# Patient Record
Sex: Female | Born: 1975 | Hispanic: Yes | Marital: Married | Smoking: Never smoker
Health system: Southern US, Community
[De-identification: ages and names within clinical notes are randomized; demographics above are authoritative.]

## PROBLEM LIST (undated history)

## (undated) DIAGNOSIS — B9689 Other specified bacterial agents as the cause of diseases classified elsewhere: Secondary | ICD-10-CM

## (undated) DIAGNOSIS — Z8049 Family history of malignant neoplasm of other genital organs: Secondary | ICD-10-CM

## (undated) DIAGNOSIS — C801 Malignant (primary) neoplasm, unspecified: Secondary | ICD-10-CM

## (undated) DIAGNOSIS — Z8041 Family history of malignant neoplasm of ovary: Secondary | ICD-10-CM

## (undated) DIAGNOSIS — F329 Major depressive disorder, single episode, unspecified: Secondary | ICD-10-CM

## (undated) DIAGNOSIS — F32A Depression, unspecified: Secondary | ICD-10-CM

## (undated) DIAGNOSIS — E7800 Pure hypercholesterolemia, unspecified: Secondary | ICD-10-CM

## (undated) DIAGNOSIS — Z800 Family history of malignant neoplasm of digestive organs: Secondary | ICD-10-CM

## (undated) HISTORY — DX: Family history of malignant neoplasm of ovary: Z80.41

## (undated) HISTORY — DX: Other specified bacterial agents as the cause of diseases classified elsewhere: B96.89

## (undated) HISTORY — DX: Family history of malignant neoplasm of digestive organs: Z80.0

## (undated) HISTORY — PX: DIAGNOSTIC LAPAROSCOPY: SUR761

## (undated) HISTORY — DX: Family history of malignant neoplasm of other genital organs: Z80.49

## (undated) HISTORY — PX: ABDOMINAL HYSTERECTOMY: SHX81

## (undated) HISTORY — PX: DILATION AND CURETTAGE OF UTERUS: SHX78

## (undated) HISTORY — DX: Malignant (primary) neoplasm, unspecified: C80.1

---
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
## 2010-02-17 ENCOUNTER — Emergency Department (HOSPITAL_COMMUNITY): Admission: EM | Admit: 2010-02-17 | Discharge: 2010-02-17 | Payer: Self-pay | Admitting: Family Medicine

## 2010-11-11 ENCOUNTER — Emergency Department (HOSPITAL_COMMUNITY)
Admission: EM | Admit: 2010-11-11 | Discharge: 2010-11-11 | Disposition: A | Discharge status: Home / Self Care | Payer: BC Managed Care – PPO | Attending: Emergency Medicine | Admitting: Emergency Medicine

## 2010-11-11 DIAGNOSIS — H04309 Unspecified dacryocystitis of unspecified lacrimal passage: Secondary | ICD-10-CM | POA: Insufficient documentation

## 2010-11-11 DIAGNOSIS — H5789 Other specified disorders of eye and adnexa: Secondary | ICD-10-CM | POA: Insufficient documentation

## 2010-11-11 DIAGNOSIS — R221 Localized swelling, mass and lump, neck: Secondary | ICD-10-CM | POA: Insufficient documentation

## 2010-11-11 DIAGNOSIS — R220 Localized swelling, mass and lump, head: Secondary | ICD-10-CM | POA: Insufficient documentation

## 2012-02-10 ENCOUNTER — Other Ambulatory Visit (HOSPITAL_COMMUNITY): Payer: Self-pay | Admitting: Obstetrics & Gynecology

## 2012-02-10 DIAGNOSIS — N979 Female infertility, unspecified: Secondary | ICD-10-CM

## 2012-02-16 ENCOUNTER — Ambulatory Visit (HOSPITAL_COMMUNITY)
Admission: RE | Admit: 2012-02-16 | Discharge: 2012-02-16 | Disposition: A | Discharge status: Home / Self Care | Payer: BC Managed Care – PPO | Source: Ambulatory Visit | Attending: Obstetrics & Gynecology | Admitting: Obstetrics & Gynecology

## 2012-02-16 DIAGNOSIS — N979 Female infertility, unspecified: Secondary | ICD-10-CM

## 2012-02-16 MED ORDER — IOHEXOL 300 MG/ML  SOLN
10.0000 mL | Freq: Once | INTRAMUSCULAR | Status: AC | PRN
  Administered 2012-02-16: 10 mL

## 2013-03-22 ENCOUNTER — Other Ambulatory Visit (HOSPITAL_COMMUNITY): Payer: Self-pay | Admitting: Obstetrics and Gynecology

## 2013-03-22 DIAGNOSIS — N971 Female infertility of tubal origin: Secondary | ICD-10-CM

## 2013-03-29 ENCOUNTER — Encounter (INDEPENDENT_AMBULATORY_CARE_PROVIDER_SITE_OTHER): Payer: Self-pay

## 2013-03-29 ENCOUNTER — Ambulatory Visit (HOSPITAL_COMMUNITY)
Admission: RE | Admit: 2013-03-29 | Discharge: 2013-03-29 | Disposition: A | Discharge status: Home / Self Care | Payer: BC Managed Care – PPO | Source: Ambulatory Visit | Attending: Obstetrics and Gynecology | Admitting: Obstetrics and Gynecology

## 2013-03-29 DIAGNOSIS — N971 Female infertility of tubal origin: Secondary | ICD-10-CM

## 2013-03-29 MED ORDER — IOHEXOL 300 MG/ML  SOLN
20.0000 mL | Freq: Once | INTRAMUSCULAR | Status: AC | PRN
  Administered 2013-03-29: 20 mL

## 2013-04-08 ENCOUNTER — Emergency Department (HOSPITAL_COMMUNITY): Payer: BC Managed Care – PPO

## 2013-04-08 ENCOUNTER — Encounter (HOSPITAL_COMMUNITY): Payer: Self-pay | Admitting: Emergency Medicine

## 2013-04-08 ENCOUNTER — Emergency Department (HOSPITAL_COMMUNITY)
Admission: EM | Admit: 2013-04-08 | Discharge: 2013-04-08 | Disposition: A | Discharge status: Home / Self Care | Payer: BC Managed Care – PPO | Attending: Emergency Medicine | Admitting: Emergency Medicine

## 2013-04-08 DIAGNOSIS — Y92410 Unspecified street and highway as the place of occurrence of the external cause: Secondary | ICD-10-CM | POA: Insufficient documentation

## 2013-04-08 DIAGNOSIS — M549 Dorsalgia, unspecified: Secondary | ICD-10-CM

## 2013-04-08 DIAGNOSIS — L089 Local infection of the skin and subcutaneous tissue, unspecified: Secondary | ICD-10-CM | POA: Insufficient documentation

## 2013-04-08 DIAGNOSIS — M545 Low back pain, unspecified: Secondary | ICD-10-CM | POA: Insufficient documentation

## 2013-04-08 DIAGNOSIS — T07XXXA Unspecified multiple injuries, initial encounter: Secondary | ICD-10-CM

## 2013-04-08 DIAGNOSIS — Z79899 Other long term (current) drug therapy: Secondary | ICD-10-CM | POA: Insufficient documentation

## 2013-04-08 DIAGNOSIS — Y9389 Activity, other specified: Secondary | ICD-10-CM | POA: Insufficient documentation

## 2013-04-08 MED ORDER — ONDANSETRON 4 MG PO TBDP
4.0000 mg | ORAL_TABLET | Freq: Once | ORAL | Status: AC
  Administered 2013-04-08: 4 mg via ORAL
  Filled 2013-04-08: qty 1

## 2013-04-08 MED ORDER — HYDROMORPHONE HCL PF 1 MG/ML IJ SOLN
1.0000 mg | Freq: Once | INTRAMUSCULAR | Status: AC
  Administered 2013-04-08: 1 mg via INTRAMUSCULAR
  Filled 2013-04-08: qty 1

## 2013-04-08 MED ORDER — ONDANSETRON 4 MG PO TBDP
4.0000 mg | ORAL_TABLET | Freq: Once | ORAL | Status: AC
  Administered 2013-04-08: 4 mg via ORAL

## 2013-04-08 MED ORDER — ONDANSETRON 4 MG PO TBDP
ORAL_TABLET | ORAL | Status: AC
  Filled 2013-04-08: qty 1

## 2013-04-08 MED ORDER — LORAZEPAM 1 MG PO TABS
1.0000 mg | ORAL_TABLET | Freq: Once | ORAL | Status: AC
  Administered 2013-04-08: 1 mg via ORAL
  Filled 2013-04-08: qty 2

## 2013-04-08 MED ORDER — OXYCODONE-ACETAMINOPHEN 5-325 MG PO TABS: 1.0000 | ORAL_TABLET | Freq: Four times a day (QID) | ORAL | Status: DC | PRN
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
## 2013-04-09 NOTE — ED Provider Notes (Signed)
Medical screening examination/treatment/procedure(s) were performed by non-physician practitioner and as supervising physician I was immediately available for consultation/collaboration.  EKG Interpretation   None        Hurman Horn, MD 04/09/13 1237

## 2013-05-24 ENCOUNTER — Encounter (HOSPITAL_COMMUNITY): Payer: Self-pay | Admitting: *Deleted

## 2013-05-26 ENCOUNTER — Encounter (HOSPITAL_COMMUNITY): Payer: Self-pay

## 2013-06-07 ENCOUNTER — Ambulatory Visit (HOSPITAL_COMMUNITY): Payer: BC Managed Care – PPO | Admitting: Anesthesiology

## 2013-06-07 ENCOUNTER — Ambulatory Visit (HOSPITAL_COMMUNITY)
Admission: RE | Admit: 2013-06-07 | Discharge: 2013-06-08 | Disposition: A | Discharge status: Home / Self Care | Payer: BC Managed Care – PPO | Source: Ambulatory Visit | Attending: Obstetrics and Gynecology | Admitting: Obstetrics and Gynecology

## 2013-06-07 ENCOUNTER — Encounter (HOSPITAL_COMMUNITY)
Admission: RE | Disposition: A | Discharge status: Home / Self Care | Payer: Self-pay | Source: Ambulatory Visit | Attending: Obstetrics and Gynecology

## 2013-06-07 ENCOUNTER — Encounter (HOSPITAL_COMMUNITY): Payer: BC Managed Care – PPO | Admitting: Anesthesiology

## 2013-06-07 ENCOUNTER — Encounter (HOSPITAL_COMMUNITY): Payer: Self-pay | Admitting: *Deleted

## 2013-06-07 DIAGNOSIS — N971 Female infertility of tubal origin: Principal | ICD-10-CM | POA: Insufficient documentation

## 2013-06-07 DIAGNOSIS — N838 Other noninflammatory disorders of ovary, fallopian tube and broad ligament: Secondary | ICD-10-CM | POA: Insufficient documentation

## 2013-06-07 DIAGNOSIS — N7011 Chronic salpingitis: Secondary | ICD-10-CM | POA: Diagnosis present

## 2013-06-07 DIAGNOSIS — N7093 Salpingitis and oophoritis, unspecified: Secondary | ICD-10-CM | POA: Insufficient documentation

## 2013-06-07 HISTORY — PX: LAPAROTOMY: SHX154

## 2013-06-07 HISTORY — PX: HYSTEROSCOPY WITH D & C: SHX1775

## 2013-06-07 HISTORY — DX: Depression, unspecified: F32.A

## 2013-06-07 HISTORY — PX: LAPAROSCOPY: SHX197

## 2013-06-07 HISTORY — DX: Major depressive disorder, single episode, unspecified: F32.9

## 2013-06-07 LAB — CBC
HCT: 40.7 % (ref 36.0–46.0)
HEMOGLOBIN: 13.6 g/dL (ref 12.0–15.0)
MCH: 29 pg (ref 26.0–34.0)
MCHC: 33.4 g/dL (ref 30.0–36.0)
MCV: 86.8 fL (ref 78.0–100.0)
PLATELETS: 274 10*3/uL (ref 150–400)
RBC: 4.69 MIL/uL (ref 3.87–5.11)
RDW: 12.1 % (ref 11.5–15.5)
WBC: 9.2 10*3/uL (ref 4.0–10.5)

## 2013-06-07 LAB — PREGNANCY, URINE: PREG TEST UR: NEGATIVE

## 2013-06-07 SURGERY — LAPAROSCOPY, DIAGNOSTIC
Anesthesia: General | Site: Uterus | Laterality: Right

## 2013-06-07 MED ORDER — KETOROLAC TROMETHAMINE 30 MG/ML IJ SOLN
INTRAMUSCULAR | Status: AC
  Filled 2013-06-07: qty 1

## 2013-06-07 MED ORDER — FENTANYL CITRATE 0.05 MG/ML IJ SOLN
INTRAMUSCULAR | Status: AC
  Filled 2013-06-07: qty 2

## 2013-06-07 MED ORDER — SODIUM CHLORIDE 0.9 % IV SOLN
INTRAVENOUS | Status: DC
Start: 2013-06-07 — End: 2013-06-08
  Administered 2013-06-07: 1000 mL via INTRAVENOUS
  Administered 2013-06-08: 09:00:00 via INTRAVENOUS

## 2013-06-07 MED ORDER — ONDANSETRON HCL 4 MG/2ML IJ SOLN
INTRAMUSCULAR | Status: DC | PRN
  Administered 2013-06-07: 4 mg via INTRAVENOUS

## 2013-06-07 MED ORDER — LIDOCAINE HCL (CARDIAC) 20 MG/ML IV SOLN
INTRAVENOUS | Status: DC | PRN
  Administered 2013-06-07: 80 mg via INTRAVENOUS

## 2013-06-07 MED ORDER — DEXAMETHASONE SODIUM PHOSPHATE 10 MG/ML IJ SOLN
INTRAMUSCULAR | Status: AC
  Filled 2013-06-07: qty 1

## 2013-06-07 MED ORDER — VASOPRESSIN 20 UNIT/ML IJ SOLN
INTRAMUSCULAR | Status: AC
  Filled 2013-06-07: qty 1

## 2013-06-07 MED ORDER — METHYLENE BLUE 1 % INJ SOLN
INTRAMUSCULAR | Status: AC
  Filled 2013-06-07: qty 10

## 2013-06-07 MED ORDER — GLYCOPYRROLATE 0.2 MG/ML IJ SOLN
INTRAMUSCULAR | Status: AC
  Filled 2013-06-07: qty 3

## 2013-06-07 MED ORDER — METHYLENE BLUE 1 % INJ SOLN
INTRAMUSCULAR | Status: DC | PRN
  Administered 2013-06-07: 60 mL

## 2013-06-07 MED ORDER — BUPIVACAINE HCL (PF) 0.25 % IJ SOLN
INTRAMUSCULAR | Status: AC
  Filled 2013-06-07: qty 30

## 2013-06-07 MED ORDER — HYDROMORPHONE HCL PF 1 MG/ML IJ SOLN
INTRAMUSCULAR | Status: AC
  Filled 2013-06-07: qty 1

## 2013-06-07 MED ORDER — CEFAZOLIN SODIUM-DEXTROSE 2-3 GM-% IV SOLR
2.0000 g | INTRAVENOUS | Status: AC
  Administered 2013-06-07: 2 g via INTRAVENOUS

## 2013-06-07 MED ORDER — LIDOCAINE HCL (CARDIAC) 20 MG/ML IV SOLN
INTRAVENOUS | Status: AC
  Filled 2013-06-07: qty 5

## 2013-06-07 MED ORDER — ONDANSETRON HCL 4 MG/2ML IJ SOLN
INTRAMUSCULAR | Status: AC
  Filled 2013-06-07: qty 2

## 2013-06-07 MED ORDER — BUPIVACAINE-EPINEPHRINE 0.25% -1:200000 IJ SOLN
INTRAMUSCULAR | Status: DC | PRN
  Administered 2013-06-07: 13 mL

## 2013-06-07 MED ORDER — HYDROMORPHONE HCL PF 1 MG/ML IJ SOLN
0.2000 mg | INTRAMUSCULAR | Status: DC | PRN
  Administered 2013-06-08: 0.6 mg via INTRAVENOUS
  Filled 2013-06-07: qty 1

## 2013-06-07 MED ORDER — FENTANYL CITRATE 0.05 MG/ML IJ SOLN
INTRAMUSCULAR | Status: AC
  Filled 2013-06-07: qty 5

## 2013-06-07 MED ORDER — LACTATED RINGERS IR SOLN
Status: DC | PRN
  Administered 2013-06-07: 1000 mL

## 2013-06-07 MED ORDER — METHYLENE BLUE 1 % INJ SOLN
INTRAMUSCULAR | Status: DC | PRN
  Administered 2013-06-07: 300 mL

## 2013-06-07 MED ORDER — ROCURONIUM BROMIDE 100 MG/10ML IV SOLN
INTRAVENOUS | Status: AC
  Filled 2013-06-07: qty 1

## 2013-06-07 MED ORDER — SODIUM CHLORIDE 0.9 % IR SOLN
Status: DC | PRN
  Administered 2013-06-07: 3000 mL

## 2013-06-07 MED ORDER — PROPOFOL 10 MG/ML IV EMUL
INTRAVENOUS | Status: AC
  Filled 2013-06-07: qty 20

## 2013-06-07 MED ORDER — SCOPOLAMINE 1 MG/3DAYS TD PT72
MEDICATED_PATCH | TRANSDERMAL | Status: AC
  Filled 2013-06-07: qty 1

## 2013-06-07 MED ORDER — NEOSTIGMINE METHYLSULFATE 1 MG/ML IJ SOLN
INTRAMUSCULAR | Status: DC | PRN
  Administered 2013-06-07: 3 mg via INTRAVENOUS

## 2013-06-07 MED ORDER — OXYCODONE-ACETAMINOPHEN 7.5-325 MG PO TABS: 1.0000 | ORAL_TABLET | ORAL | Status: DC | PRN

## 2013-06-07 MED ORDER — ONDANSETRON HCL 4 MG PO TABS: 4.0000 mg | ORAL_TABLET | Freq: Three times a day (TID) | ORAL | Status: DC | PRN

## 2013-06-07 MED ORDER — BUPIVACAINE HCL (PF) 0.25 % IJ SOLN
INTRAMUSCULAR | Status: DC | PRN
  Administered 2013-06-07: 13 mL

## 2013-06-07 MED ORDER — SODIUM CHLORIDE 0.9 % IV SOLN
INTRAVENOUS | Status: DC | PRN
  Administered 2013-06-07: 15:00:00 via INTRAMUSCULAR

## 2013-06-07 MED ORDER — LACTATED RINGERS IV SOLN
INTRAVENOUS | Status: DC
  Administered 2013-06-07 (×3): via INTRAVENOUS

## 2013-06-07 MED ORDER — ROCURONIUM BROMIDE 100 MG/10ML IV SOLN
INTRAVENOUS | Status: DC | PRN
  Administered 2013-06-07: 50 mg via INTRAVENOUS
  Administered 2013-06-07 (×4): 10 mg via INTRAVENOUS

## 2013-06-07 MED ORDER — NEOSTIGMINE METHYLSULFATE 1 MG/ML IJ SOLN
INTRAMUSCULAR | Status: AC
  Filled 2013-06-07: qty 1

## 2013-06-07 MED ORDER — ONDANSETRON HCL 4 MG/2ML IJ SOLN
4.0000 mg | Freq: Four times a day (QID) | INTRAMUSCULAR | Status: DC | PRN
  Administered 2013-06-07: 4 mg via INTRAVENOUS

## 2013-06-07 MED ORDER — FENTANYL CITRATE 0.05 MG/ML IJ SOLN
25.0000 ug | INTRAMUSCULAR | Status: DC | PRN
  Administered 2013-06-07: 25 ug via INTRAVENOUS

## 2013-06-07 MED ORDER — PROPOFOL 10 MG/ML IV BOLUS
INTRAVENOUS | Status: DC | PRN
  Administered 2013-06-07: 160 mg via INTRAVENOUS

## 2013-06-07 MED ORDER — OXYCODONE-ACETAMINOPHEN 5-325 MG PO TABS
1.0000 | ORAL_TABLET | ORAL | Status: DC | PRN
  Administered 2013-06-08: 2 via ORAL
  Filled 2013-06-07: qty 2

## 2013-06-07 MED ORDER — IBUPROFEN 800 MG PO TABS: 800.0000 mg | ORAL_TABLET | Freq: Three times a day (TID) | ORAL | Status: DC | PRN

## 2013-06-07 MED ORDER — FENTANYL CITRATE 0.05 MG/ML IJ SOLN
INTRAMUSCULAR | Status: DC | PRN
Start: 2013-06-07 — End: 2013-06-07
  Administered 2013-06-07: 100 ug via INTRAVENOUS
  Administered 2013-06-07: 50 ug via INTRAVENOUS
  Administered 2013-06-07: 100 ug via INTRAVENOUS
  Administered 2013-06-07 (×2): 50 ug via INTRAVENOUS

## 2013-06-07 MED ORDER — MIDAZOLAM HCL 2 MG/2ML IJ SOLN
INTRAMUSCULAR | Status: AC
  Filled 2013-06-07: qty 2

## 2013-06-07 MED ORDER — HYDROMORPHONE HCL PF 1 MG/ML IJ SOLN
INTRAMUSCULAR | Status: DC | PRN
  Administered 2013-06-07 (×2): 1 mg via INTRAVENOUS

## 2013-06-07 MED ORDER — SODIUM CHLORIDE 0.9 % IJ SOLN
INTRAMUSCULAR | Status: AC
  Filled 2013-06-07: qty 50

## 2013-06-07 MED ORDER — HEPARIN SODIUM (PORCINE) 5000 UNIT/ML IJ SOLN
INTRAMUSCULAR | Status: AC
  Filled 2013-06-07: qty 1

## 2013-06-07 MED ORDER — MIDAZOLAM HCL 2 MG/2ML IJ SOLN
INTRAMUSCULAR | Status: DC | PRN
  Administered 2013-06-07: 2 mg via INTRAVENOUS

## 2013-06-07 MED ORDER — SCOPOLAMINE 1 MG/3DAYS TD PT72
1.0000 | MEDICATED_PATCH | TRANSDERMAL | Status: DC
  Administered 2013-06-07: 1.5 mg via TRANSDERMAL

## 2013-06-07 MED ORDER — ONDANSETRON HCL 4 MG PO TABS: 4.0000 mg | ORAL_TABLET | Freq: Four times a day (QID) | ORAL | Status: DC | PRN

## 2013-06-07 MED ORDER — KETOROLAC TROMETHAMINE 30 MG/ML IJ SOLN
INTRAMUSCULAR | Status: DC | PRN
  Administered 2013-06-07: 30 mg via INTRAVENOUS

## 2013-06-07 MED ORDER — GLYCOPYRROLATE 0.2 MG/ML IJ SOLN
INTRAMUSCULAR | Status: DC | PRN
  Administered 2013-06-07: 0.1 mg via INTRAVENOUS
  Administered 2013-06-07: 0.6 mg via INTRAVENOUS

## 2013-06-07 MED ORDER — DEXAMETHASONE SODIUM PHOSPHATE 10 MG/ML IJ SOLN
INTRAMUSCULAR | Status: DC | PRN
  Administered 2013-06-07: 10 mg via INTRAVENOUS

## 2013-06-07 MED ORDER — HEPARIN SODIUM (PORCINE) 5000 UNIT/ML IJ SOLN
INTRAMUSCULAR | Status: DC | PRN
Start: 2013-06-07 — End: 2013-06-07
  Administered 2013-06-07: 5000 [IU] via SUBCUTANEOUS

## 2013-06-07 SURGICAL SUPPLY — 76 items
ADAPTER CATH SYR TO TUBING 38M (ADAPTER) ×4 IMPLANT
CABLE HIGH FREQUENCY MONO STRZ (ELECTRODE) IMPLANT
CANISTER SUCT 3000ML (MISCELLANEOUS) ×4 IMPLANT
CANNULA CURETTE W/SYR 6 (CANNULA) IMPLANT
CANNULA CURETTE W/SYR 7 (CANNULA) IMPLANT
CATH NOVY CORNUAL CURVED 5.0 (CATHETERS) IMPLANT
CATH ROBINSON RED A/P 16FR (CATHETERS) ×4 IMPLANT
CELLS DAT CNTRL 66122 CELL SVR (MISCELLANEOUS) ×3 IMPLANT
CLOTH BEACON ORANGE TIMEOUT ST (SAFETY) ×4 IMPLANT
CONTAINER PREFILL 10% NBF 60ML (FORM) ×8 IMPLANT
DERMABOND ADVANCED (GAUZE/BANDAGES/DRESSINGS) ×1
DERMABOND ADVANCED .7 DNX12 (GAUZE/BANDAGES/DRESSINGS) ×3 IMPLANT
DEVICE TROCAR PUNCTURE CLOSURE (ENDOMECHANICALS) IMPLANT
DRAPE C-ARM 42X72 X-RAY (DRAPES) IMPLANT
DRSG TELFA 3X8 NADH (GAUZE/BANDAGES/DRESSINGS) ×4 IMPLANT
ELECT NEEDLE TIP 2.8 STRL (NEEDLE) ×4 IMPLANT
ELECT REM PT RETURN 9FT ADLT (ELECTROSURGICAL) ×4
ELECTRODE REM PT RTRN 9FT ADLT (ELECTROSURGICAL) ×3 IMPLANT
ELECTRODE ROLLER VERSAPOINT (ELECTRODE) IMPLANT
ELECTRODE RT ANGLE VERSAPOINT (CUTTING LOOP) IMPLANT
EVACUATOR SMOKE 8.L (FILTER) IMPLANT
FORCEPS CUTTING 33CM 5MM (CUTTING FORCEPS) IMPLANT
GLOVE BIO SURGEON STRL SZ8 (GLOVE) ×4 IMPLANT
GLOVE BIOGEL PI IND STRL 8.5 (GLOVE) ×3 IMPLANT
GLOVE BIOGEL PI INDICATOR 8.5 (GLOVE) ×1
GOWN PREVENTION PLUS LG XLONG (DISPOSABLE) ×8 IMPLANT
GOWN STRL REIN XL XLG (GOWN DISPOSABLE) ×8 IMPLANT
LOOP ANGLED CUTTING 22FR (CUTTING LOOP) IMPLANT
MANIPULATOR UTERINE 4.5 ZUMI (MISCELLANEOUS) ×4 IMPLANT
NDL SAFETY ECLIPSE 18X1.5 (NEEDLE) ×3 IMPLANT
NEEDLE FILTER BLUNT 18X 1/2SAF (NEEDLE) ×1
NEEDLE FILTER BLUNT 18X1 1/2 (NEEDLE) ×3 IMPLANT
NEEDLE HYPO 18GX1.5 SHARP (NEEDLE) ×1
NEEDLE INSUFFLATION 150MM (ENDOMECHANICALS) ×4 IMPLANT
NEEDLE SPNL 18GX3.5 QUINCKE PK (NEEDLE) ×4 IMPLANT
PACK HYSTEROSCOPY LF (CUSTOM PROCEDURE TRAY) ×4 IMPLANT
PACK LAPAROSCOPY BASIN (CUSTOM PROCEDURE TRAY) ×4 IMPLANT
PAD OB MATERNITY 4.3X12.25 (PERSONAL CARE ITEMS) ×4 IMPLANT
PENCIL BUTTON HOLSTER BLD 10FT (ELECTRODE) ×8 IMPLANT
POUCH SPECIMEN RETRIEVAL 10MM (ENDOMECHANICALS) IMPLANT
PROTECTOR NERVE ULNAR (MISCELLANEOUS) ×4 IMPLANT
RTRCTR WOUND ALEXIS 18CM MED (MISCELLANEOUS) ×4
SEPRAFILM MEMBRANE 5X6 (MISCELLANEOUS) IMPLANT
SET IRRIG TUBING LAPAROSCOPIC (IRRIGATION / IRRIGATOR) ×4 IMPLANT
SPONGE LAP 18X18 X RAY DECT (DISPOSABLE) ×8 IMPLANT
STENT BALLN UTERINE 3CM 6FR (Stent) IMPLANT
STENT BALLN UTERINE 4CM 6FR (STENTS) IMPLANT
SUT PROLENE 0 CT 1 30 (SUTURE) IMPLANT
SUT PROLENE 0 TP 1 60 (SUTURE) ×4 IMPLANT
SUT PROLENE 6 0 P 1 18 (SUTURE) ×4 IMPLANT
SUT SILK 2 0 SH (SUTURE) IMPLANT
SUT VIC AB 0 CT1 27 (SUTURE) ×1
SUT VIC AB 0 CT1 27XBRD ANBCTR (SUTURE) ×3 IMPLANT
SUT VIC AB 2-0 CT1 27 (SUTURE) ×2
SUT VIC AB 2-0 CT1 TAPERPNT 27 (SUTURE) ×6 IMPLANT
SUT VIC AB 2-0 UR6 27 (SUTURE) IMPLANT
SUT VIC AB 3-0 SH 27 (SUTURE) ×1
SUT VIC AB 3-0 SH 27X BRD (SUTURE) ×3 IMPLANT
SUT VIC AB 4-0 KS 27 (SUTURE) ×4 IMPLANT
SUT VIC AB 4-0 SH 27 (SUTURE) ×2
SUT VIC AB 4-0 SH 27XANBCTRL (SUTURE) ×6 IMPLANT
SUT VICRYL 0 TIES 12 18 (SUTURE) IMPLANT
SUT-PROLENE 10 788G ×4 IMPLANT
SUT-PROLENE 8 8741H ×8 IMPLANT
SYR 20CC LL (SYRINGE) ×4 IMPLANT
SYR 50ML LL SCALE MARK (SYRINGE) ×12 IMPLANT
SYR CONTROL 10ML LL (SYRINGE) ×4 IMPLANT
SYR TOOMEY 50ML (SYRINGE) ×4 IMPLANT
TOWEL OR 17X24 6PK STRL BLUE (TOWEL DISPOSABLE) ×8 IMPLANT
TRAY FOLEY CATH 14FR (SET/KITS/TRAYS/PACK) ×4 IMPLANT
TROCAR OPTI TIP 5M 100M (ENDOMECHANICALS) ×4 IMPLANT
TROCAR XCEL DIL TIP R 11M (ENDOMECHANICALS) IMPLANT
TROCAR XCEL OPT SLVE 5M 100M (ENDOMECHANICALS) ×12 IMPLANT
VACUTAINER ×4 IMPLANT
WARMER LAPAROSCOPE (MISCELLANEOUS) ×4 IMPLANT
WATER STERILE IRR 1000ML POUR (IV SOLUTION) ×4 IMPLANT

## 2013-06-07 NOTE — Anesthesia Postprocedure Evaluation (Signed)
  Anesthesia Post-op Note  Patient: Caitlin [name]  Procedure(s) Performed: Procedure(s): LAPAROSCOPY DIAGNOSTIC with aspiration of left peritubal cyst   (Left) DILATATION AND CURETTAGE /HYSTEROSCOPY with bilateral tubal cathetrization   (Bilateral) LAPAROTOMY with bilateral corneal segmental salpingectomy right corneal anastamosis (Right)  Patient Location: PACU  Anesthesia Type:General  Level of Consciousness: awake, alert  and oriented  Airway and Oxygen Therapy: Patient Spontanous Breathing  Post-op Pain: mild  Post-op Assessment: Post-op Vital signs reviewed, Patient's Cardiovascular Status Stable, Respiratory Function Stable, Patent Airway, No signs of Nausea or vomiting and Pain level controlled  Post-op Vital Signs: Reviewed and stable  Complications: No apparent anesthesia complications

## 2013-06-07 NOTE — Transfer of Care (Signed)
Immediate Anesthesia Transfer of Care Note  Patient: Caitlin [name]  Procedure(s) Performed: Procedure(s): LAPAROSCOPY DIAGNOSTIC with aspiration of left peritubal cyst   (Left) DILATATION AND CURETTAGE /HYSTEROSCOPY with bilateral tubal cathetrization   (Bilateral) LAPAROTOMY with bilateral corneal segmental salpingectomy right corneal anastamosis (Right)  Patient Location: PACU  Anesthesia Type:General  Level of Consciousness: awake, alert, sedated Airway & Oxygen Therapy: Patient Spontanous Breathing and Patient connected to face mask oxygen  Post-op Assessment: Report given to PACU RN and Post -op Vital signs reviewed and stable  Post vital signs: Reviewed and stable  Complications: No apparent anesthesia complications

## 2013-06-07 NOTE — Anesthesia Preprocedure Evaluation (Signed)
Anesthesia Evaluation  Patient identified by MRN, date of birth, ID band Patient awake    Reviewed: Allergy & Precautions, H&P , Patient's Chart, lab work & pertinent test results, reviewed documented beta blocker date and time   Airway Mallampati: III  TM Distance: >3 FB Neck ROM: full    Dental no notable dental hx.    Pulmonary  breath sounds clear to auscultation  Pulmonary exam normal       Cardiovascular Rhythm:regular Rate:Normal     Neuro/Psych    GI/Hepatic   Endo/Other  Morbid obesity  Renal/GU      Musculoskeletal   Abdominal   Peds  Hematology   Anesthesia Other Findings   Reproductive/Obstetrics                             Anesthesia Physical Anesthesia Plan  ASA: III  Anesthesia Plan: General   Post-op Pain Management:    Induction: Intravenous  Airway Management Planned: Oral ETT  Additional Equipment:   Intra-op Plan:   Post-operative Plan: Extubation in OR  Informed Consent: I have reviewed the patients History and Physical, chart, labs and discussed the procedure including the risks, benefits and alternatives for the proposed anesthesia with the patient or authorized representative who has indicated his/her understanding and acceptance.   Dental Advisory Given and Dental advisory given  Plan Discussed with: CRNA and Surgeon  Anesthesia Plan Comments: (  Discussed general anesthesia, including possible nausea, instrumentation of airway, sore throat,pulmonary aspiration, etc. I asked if the were any outstanding questions, or  concerns before we proceeded. )        Anesthesia Quick Evaluation  

## 2013-06-07 NOTE — H&P (Signed)
Caitlin [name] is a 38 y.o. female , who has persistent bilateral proximal tubal occlusion. An initial HSG suggested the tubal occlusion. She subsequently underwent hysteroscopy and laparoscopy (diagnostic only), during which chromotubation further confirmed proximal occlusion. Patient was subsequently seen by me and underwent a fluoroscopic tubal catheterization in radiology department which was unsuccessful. I discussed the options with this patient, including in vitro fertilization. The patient requested a tubal recannulization attempt Y. hysteroscopy with a fluoroscopic surveillance under general anesthesia and furthermore segmental tubal excision and tubal tubal microsurgical anastomosis if fibrotic scarring appears to be causing the proximal occlusion. Of note, during her last hysteroscopy and laparoscopy, both tubal ostia were noted to be normal and there was no evidence of salpingitis isthmica nodosa externally.  Pertinent Gynecological History: Menses:  regular to 28 d. Contraception: none DES exposure: denies Blood transfusions: none Sexually transmitted diseases: no past history Previous GYN Procedures: HSG, fluoroscopic tubal catheterization, hysteroscopy and laparoscopy   Last pap: normal   Menstrual History: Menarche age: 22 No LMP recorded.    Past Medical History  Diagnosis Date  . Depression                     Past Surgical History  Procedure Laterality Date  . Dilation and curettage of uterus    . Diagnostic laparoscopy               History reviewed. No pertinent family history. No hereditary disease.  No cancer of breast, ovary, uterus. No cutaneous leiomyomatosis or renal cell carcinoma.  History   Social History  . Marital Status: Married    Spouse Name: N/A    Number of Children: N/A  . Years of Education: N/A   Occupational History  . Not on file.   Social History Main Topics  . Smoking status: Never Smoker   . Smokeless tobacco: Never Used   . Alcohol Use: No  . Drug Use: Not on file  . Sexual Activity: Not on file   Other Topics Concern  . Not on file   Social History Narrative  . No narrative on file    No Known Allergies  No current facility-administered medications on file prior to encounter.   Current Outpatient Prescriptions on File Prior to Encounter  Medication Sig Dispense Refill  . buPROPion (WELLBUTRIN XL) 150 MG 24 hr tablet Take 150 mg by mouth daily.         Review of Systems  Constitutional: Negative.   HENT: Negative.   Eyes: Negative.   Respiratory: Negative.   Cardiovascular: Negative.   Gastrointestinal: Negative.   Genitourinary: Negative.   Musculoskeletal: Negative.   Skin: Negative.   Neurological: Negative.   Endo/Heme/Allergies: Negative.   Psychiatric/Behavioral: History of depression    Physical Exam  BP 128/94  Pulse 74  Temp(Src) 98.1 F (36.7 C) (Oral)  Resp 20  Ht 5\' 2"  (1.575 m)  Wt 105.235 kg (232 lb)  BMI 42.42 kg/m2  SpO2 98% Constitutional: She is oriented to person, place, and time. She appears well-developed and well-nourished.  HENT:  Head: Normocephalic and atraumatic.  Nose: Nose normal.  Mouth/Throat: Oropharynx is clear and moist. No oropharyngeal exudate.  Eyes: Conjunctivae normal and EOM are normal. Pupils are equal, round, and reactive to light. No scleral icterus.  Neck: Normal range of motion. Neck supple. No tracheal deviation present. No thyromegaly present.  Cardiovascular: Normal rate.   Respiratory: Effort normal and breath sounds normal.  GI: Soft. Bowel sounds are  normal. She exhibits no distension and no mass. There is no tenderness.  Lymphadenopathy:    She has no cervical adenopathy.  Neurological: She is alert and oriented to person, place, and time. She has normal reflexes.  Skin: Skin is warm.  Psychiatric: She has a normal mood and affect. Her behavior is normal. Judgment and thought content normal.    Assessment/Plan: Bilateral  tubal proximal occlusion, with unsuccessful HSG x2 and fluoroscopic tubal catheterization, in addition to failure of chromotubation during hysteroscopy, laparoscopy under general anesthesia. She is for hysteroscopy, laparoscopy, transvaginal fallopian tube recanalization, possible minilaparotomy and segmental excision of fibrotic proximal tubal segment and microsurgical anastomosis. Benefits and risks of as well as alternatives such as in vitro fertilization were discussed with the patient. All patient's questions were answered.  Governor Specking

## 2013-06-07 NOTE — Anesthesia Procedure Notes (Addendum)
Procedure Name: Intubation Date/Time: 06/07/2013 12:53 PM Performed by: Flossie Dibble Pre-anesthesia Checklist: Patient identified, Timeout performed, Emergency Drugs available, Suction available and Patient being monitored Patient Re-evaluated:Patient Re-evaluated prior to inductionOxygen Delivery Method: Circle system utilized Preoxygenation: Pre-oxygenation with 100% oxygen Intubation Type: IV induction Ventilation: Mask ventilation without difficulty Laryngoscope Size: Mac and 3 Grade View: Grade I Tube size: 7.0 mm Number of attempts: 1 Airway Equipment and Method: Patient positioned with wedge pillow and Stylet Placement Confirmation: ETT inserted through vocal cords under direct vision,  breath sounds checked- equal and bilateral and positive ETCO2 Secured at: 21 cm Tube secured with: Tape Dental Injury: Teeth and Oropharynx as per pre-operative assessment

## 2013-06-07 NOTE — Op Note (Addendum)
OPERATIVE NOTE  Preoperative diagnosis: Bilateral proximal tubal occlusion, infertility Postoperative diagnosis: Bilateral proximal tubal occlusion, salpingitis isthmica nodosa, bilateral, , infertility, left paratubal cyst Procedure: Hysteroscopy,llateral tubal catheterization (attempted), laparoscopy, left paratubal cystectomy, laparotomy, bilateral segmental salpingectomy and microsurgical right isthmic-cornual anastomosis Anesthesia: Gen. Endotracheal Surgeon: Governor Specking Complications: None  Estimated blood loss: 100 mL Specimens: Tubal segments and left paratubal cyst to pathology, the fluid from left paratubal cyst for cytology  Findings: On exam under anesthesia, the uterus was normal size anteverted and mobile. There was no posterior fornix nodularity. On hysteroscopy the endometrium was of normal texture within normal uterine cavity. The right tubal ostium was seen, as well as to ostium was covered with a filmy endometrial layer. On laparoscopy, liver, diaphragm surfaces were normal. Although the proximal tubal segments made contained endometriosis, the peritoneal surfaces were normal.  Unlike the description of the tubes during a previous laparoscopy, the tubes looked somewhat abnormal: The distal half of the tubes were very tortuous. The fimbria appeared normal, 5 out of 5. The proximal segment of the tubes did not show any obvious swelling, but during laparotomy, upon palpation, the right proximal isthmic portion as well as the mid tubal portion appeared to be hardened. In addition an accessory ostium was encountered on the two thirds of the ampulla portion of the right tube.  There was a 5 cm left paratubal/paraovarian cyst with clear fluid and no internal excrescences. This cyst was first drained and removed.  Description of the procedure: Patient was placed in dorsal supine position and general endotracheal anesthesia was given. 2 g of cefazolin were given intravenously for  prophylaxis. She was placed in lithotomy position and prepped and draped in sterile manner for hysteroscopy and laparoscopy. A Foley catheter was placed into bladder. First a laparoscopy was performed by preemptively anesthetizing the intraumbilical area with 3.23% bupivacaine with 1 200,000 epinephrine. A 5 mm vertical incision was made. A Verress needle was inserted and its correct location was verified. A pneumoperitoneum was created with carbon dioxide. A 5 mm trocar was inserted and video laparoscopy was started. 2 other 5 mm incisions were made in each lower quadrant and corresponding trochars were inserted under direct visualization. Above findings were noted. The patient was placed in Trendelenburg position. A 16-gauge cyst aspiration was inserted into the left adnexal cyst and was aspirated. An incision was made on the broad ligament covering the cyst and the cyst was removed and was submitted to pathology. With the surgical assistant holding the laparoscope for surveillance of the tubal catheterization procedure, the surgeon took his position for hysteroscopy. A 5 mm 30 hysteroscope was used and video hysteroscopy was monitored on a second video screen.  We used a Cook coaxial fallopian tube catheterization set. The ostial access catheter (5.5 [place]) was passed into the uterine cavity alongside the hysteroscope (it did not fit through the operative port) and it was easily wedged into the right tubal ostium. A 0.035 inch J.-T.-guidewire was wedged into the right tubal ostium and an firm resistance was met. We then switched to a 3 French cornual access catheter through which a 0.015 inch straight guidewire had been placed this was inserted coaxially through the 5.5 French ostial access catheter. We again met firm resistance and decided to abandon further catheterization of the right tube. The same steps were repeated for the left side and approximately 1 cm of the left proximal tube was observed to accept  the 0.035 inch guidewire under laparoscopy. However when we pulled the  guidewire out and injected dilute methylene blue through the 3 French cornual access catheter, we did not see further filling of the left tube, no spillage was observed. A decision was made to do a minilaparotomy. After preemptive anesthesia, a 8 cm Pfannenstiel incision was made and the peritoneal cavity was entered after dissection of the anatomic layers in succession. The vagina was packed with a Kerlix gauze to elevate the uterus, after a ZUMI cath the inserted into the uterus, which sounded to 9 cm. An Alexis self-retaining disposable retractor was placed into the incision and the bowel was packed with moist lap pad.  A dilute vasopressin was injected into the cornu on the left and the tube was transected sharply after a circumferential scoring incision had been made with needle tip electrode using 20 W cutting mode. We had to excise about a centimeter of the isthmic portion of the tube until we met what appeared to be a healthy tubal lumen. However we were unable to reach the patent portion of the interstitial portion of the tube, as tested by chromotubation via ZUMI and separately via a 18-gauge transfundally inserted needle through which dilute methylene blue was injected. The cornual portion of the uterus was excised progressively in small slices with the hope of finding a patent lumen of the left interstitial portion of the tube. Finally we abandoned this approach, Oxman to the cornual myometrial defect performed with 4-0 Vicryl figure-of-eight sutures and turned our attention to the right tube. There were palpable nodularities in the proximal isthmic portion of the tube as was the midportion of the tube. In the room right tube was transected in a similar manner at the uterotubal junction and about a centimeter of the proximal isthmic portion was excised until healthy lumen was encountered. We took out small slices from the proximal  portion of the interstitial part of the tube, together with the surrounding myometrium, until patent lumen was encountered. A right cornual-isthmic anastomosis was done in the following manner: A 6-0 Prolene suture was placed to approximate the 2 ends of the tube and close that gap in the mesosalpinx. Using the operative microscope we then placed a 10-0 Prolene suture going through the mucosa and muscularis layers at 6:00 position. 2 other 10-0 Prolene sutures were placed approximating the tubal ends together. A second layer of suture was placed with 8-0 Prolene on the serosa and muscularis layer of the tube. Hemostasis was checked. The lap pad was removed. Instrument count and lap pad count were correct. The rectus sheath was approximated with 2-0 Vicryl continuous suture. Subcutaneous tissue was irrigated. The skin was closed with 4-0 Vicryl in subcuticular sutures. The patient tolerated the procedure well and was transferred to recovery room in satisfactory condition. If she does not conceive in 3-6 months we will the postop HSG.  SPECIAL NOTE: Because of the wide cornual excisions performed on the uterus, the myometrial layer of the uterus should be considered to be disrupted and the patient should deliver her next pregnancy via cesarean section.

## 2013-06-07 NOTE — OR Nursing (Signed)
Edited the cytology requisition to include the name of the specimen.  Printed another requisition and took to the lab

## 2013-06-08 ENCOUNTER — Encounter (HOSPITAL_COMMUNITY): Payer: Self-pay | Admitting: Obstetrics and Gynecology

## 2013-06-08 MED FILL — Heparin Sodium (Porcine) Inj 5000 Unit/ML: INTRAMUSCULAR | Qty: 1 | Status: AC

## 2013-06-08 NOTE — Discharge Instructions (Signed)
May shower °

## 2013-06-08 NOTE — Anesthesia Postprocedure Evaluation (Signed)
  Anesthesia Post-op Note  Patient: Caitlin [name]  Procedure(s) Performed: Procedure(s): LAPAROSCOPY DIAGNOSTIC with aspiration of left peritubal cyst   (Left) DILATATION AND CURETTAGE /HYSTEROSCOPY with bilateral tubal cathetrization   (Bilateral) LAPAROTOMY with bilateral corneal segmental salpingectomy right corneal anastamosis (Right)  Patient Location: PACU and Women's Unit  Anesthesia Type:General  Level of Consciousness: awake, alert , oriented and patient cooperative  Airway and Oxygen Therapy: Patient Spontanous Breathing  Post-op Pain: none  Post-op Assessment: Post-op Vital signs reviewed, Patient's Cardiovascular Status Stable and Respiratory Function Stable  Post-op Vital Signs: Reviewed and stable  Complications: No apparent anesthesia complications

## 2013-06-08 NOTE — Progress Notes (Signed)
Pt is discharged on the care of husband per ambulatory with N.T> escort. Discharged instructions with Rx were given to p[t. Understands all instructions well ,Denies any pain or discomfort. Denies heavy vaginal  Bleeding. After many trys pt was able to void, adeguate amount of amber colored urine. Stable.

## 2013-10-03 ENCOUNTER — Emergency Department (HOSPITAL_COMMUNITY)
Admission: EM | Admit: 2013-10-03 | Discharge: 2013-10-04 | Disposition: A | Discharge status: Home / Self Care | Payer: BC Managed Care – PPO | Attending: Emergency Medicine | Admitting: Emergency Medicine

## 2013-10-03 ENCOUNTER — Encounter (HOSPITAL_COMMUNITY): Payer: Self-pay | Admitting: Emergency Medicine

## 2013-10-03 DIAGNOSIS — Z79899 Other long term (current) drug therapy: Secondary | ICD-10-CM | POA: Insufficient documentation

## 2013-10-03 DIAGNOSIS — R918 Other nonspecific abnormal finding of lung field: Secondary | ICD-10-CM

## 2013-10-03 DIAGNOSIS — R911 Solitary pulmonary nodule: Secondary | ICD-10-CM | POA: Insufficient documentation

## 2013-10-03 DIAGNOSIS — R079 Chest pain, unspecified: Secondary | ICD-10-CM | POA: Insufficient documentation

## 2013-10-03 DIAGNOSIS — Z8659 Personal history of other mental and behavioral disorders: Secondary | ICD-10-CM | POA: Insufficient documentation

## 2013-10-03 LAB — I-STAT TROPONIN, ED: Troponin i, poc: 0.01 ng/mL (ref 0.00–0.08)

## 2013-10-03 LAB — COMPREHENSIVE METABOLIC PANEL
ALBUMIN: 3.8 g/dL (ref 3.5–5.2)
ALT: 34 U/L (ref 0–35)
AST: 35 U/L (ref 0–37)
Alkaline Phosphatase: 75 U/L (ref 39–117)
BUN: 14 mg/dL (ref 6–23)
CALCIUM: 9.5 mg/dL (ref 8.4–10.5)
CO2: 28 mEq/L (ref 19–32)
CREATININE: 0.79 mg/dL (ref 0.50–1.10)
Chloride: 100 mEq/L (ref 96–112)
GFR calc Af Amer: >90 mL/min (ref >90)
GFR calc non Af Amer: >90 mL/min (ref >90)
Glucose, Bld: 100 mg/dL — ABNORMAL HIGH (ref 70–99)
Potassium: 4.5 mEq/L (ref 3.7–5.3)
Sodium: 139 mEq/L (ref 137–147)
TOTAL PROTEIN: 7.8 g/dL (ref 6.0–8.3)
Total Bilirubin: 0.2 mg/dL — ABNORMAL LOW (ref 0.3–1.2)

## 2013-10-03 LAB — CBC WITH DIFFERENTIAL/PLATELET
BASOS ABS: 0 10*3/uL (ref 0.0–0.1)
BASOS PCT: 0 % (ref 0–1)
EOS PCT: 2 % (ref 0–5)
Eosinophils Absolute: 0.2 10*3/uL (ref 0.0–0.7)
HCT: 40 % (ref 36.0–46.0)
Hemoglobin: 13.3 g/dL (ref 12.0–15.0)
Lymphocytes Relative: 27 % (ref 12–46)
Lymphs Abs: 2.8 10*3/uL (ref 0.7–4.0)
MCH: 28.9 pg (ref 26.0–34.0)
MCHC: 33.3 g/dL (ref 30.0–36.0)
MCV: 86.8 fL (ref 78.0–100.0)
Monocytes Absolute: 0.8 10*3/uL (ref 0.1–1.0)
Monocytes Relative: 8 % (ref 3–12)
Neutro Abs: 6.7 10*3/uL (ref 1.7–7.7)
Neutrophils Relative %: 63 % (ref 43–77)
Platelets: 285 10*3/uL (ref 150–400)
RBC: 4.61 MIL/uL (ref 3.87–5.11)
RDW: 11.9 % (ref 11.5–15.5)
WBC: 10.5 10*3/uL (ref 4.0–10.5)

## 2013-10-03 NOTE — ED Notes (Signed)
Pt. reports left shoulder pain radiating to left upper back and left arm for several days worse with deep inspiration , exertional dyspnea , denies nausea or vomitting . Denies injury .

## 2013-10-04 ENCOUNTER — Emergency Department (HOSPITAL_COMMUNITY): Payer: BC Managed Care – PPO

## 2013-10-04 LAB — I-STAT TROPONIN, ED: Troponin i, poc: 0 ng/mL (ref 0.00–0.08)

## 2013-10-04 MED ORDER — ONDANSETRON HCL 4 MG/2ML IJ SOLN
4.0000 mg | Freq: Once | INTRAMUSCULAR | Status: AC
  Administered 2013-10-04: 4 mg via INTRAVENOUS
  Filled 2013-10-04: qty 2

## 2013-10-04 MED ORDER — TRAMADOL HCL 50 MG PO TABS
50.0000 mg | ORAL_TABLET | Freq: Four times a day (QID) | ORAL | Status: DC | PRN
Start: 2013-10-04 — End: 2019-01-17

## 2013-10-04 MED ORDER — SODIUM CHLORIDE 0.9 % IV SOLN
INTRAVENOUS | Status: DC
  Administered 2013-10-04: 01:00:00 via INTRAVENOUS

## 2013-10-04 MED ORDER — IOHEXOL 350 MG/ML SOLN
100.0000 mL | Freq: Once | INTRAVENOUS | Status: AC | PRN
  Administered 2013-10-04: 100 mL via INTRAVENOUS

## 2013-10-04 MED ORDER — IBUPROFEN 600 MG PO TABS: 600.0000 mg | ORAL_TABLET | Freq: Four times a day (QID) | ORAL | Status: DC | PRN

## 2013-10-04 MED ORDER — FENTANYL CITRATE 0.05 MG/ML IJ SOLN
50.0000 ug | INTRAMUSCULAR | Status: DC | PRN
  Administered 2013-10-04: 50 ug via INTRAVENOUS
  Filled 2013-10-04: qty 2

## 2013-10-04 NOTE — ED Notes (Signed)
Dr. Opitz at bedside. 

## 2013-10-04 NOTE — Discharge Instructions (Signed)
Chest Pain (Nonspecific) °It is often hard to give a specific diagnosis for the cause of chest pain. There is always a chance that your pain could be related to something serious, such as a heart attack or a blood clot in the lungs. You need to follow up with your caregiver for further evaluation. °CAUSES  °· Heartburn. °· Pneumonia or bronchitis. °· Anxiety or stress. °· Inflammation around your heart (pericarditis) or lung (pleuritis or pleurisy). °· A blood clot in the lung. °· A collapsed lung (pneumothorax). It can develop suddenly on its own (spontaneous pneumothorax) or from injury (trauma) to the chest. °· Shingles infection (herpes zoster virus). °The chest wall is composed of bones, muscles, and cartilage. Any of these can be the source of the pain. °· The bones can be bruised by injury. °· The muscles or cartilage can be strained by coughing or overwork. °· The cartilage can be affected by inflammation and become sore (costochondritis). °DIAGNOSIS  °Lab tests or other studies, such as X-rays, electrocardiography, stress testing, or cardiac imaging, may be needed to find the cause of your pain.  °TREATMENT  °· Treatment depends on what may be causing your chest pain. Treatment may include: °· Acid blockers for heartburn. °· Anti-inflammatory medicine. °· Pain medicine for inflammatory conditions. °· Antibiotics if an infection is present. °· You may be advised to change lifestyle habits. This includes stopping smoking and avoiding alcohol, caffeine, and chocolate. °· You may be advised to keep your head raised (elevated) when sleeping. This reduces the chance of acid going backward from your stomach into your esophagus. °· Most of the time, nonspecific chest pain will improve within 2 to 3 days with rest and mild pain medicine. °HOME CARE INSTRUCTIONS  °· If antibiotics were prescribed, take your antibiotics as directed. Finish them even if you start to feel better. °· For the next few days, avoid physical  activities that bring on chest pain. Continue physical activities as directed. °· Do not smoke. °· Avoid drinking alcohol. °· Only take over-the-counter or prescription medicine for pain, discomfort, or fever as directed by your caregiver. °· Follow your caregiver's suggestions for further testing if your chest pain does not go away. °· Keep any follow-up appointments you made. If you do not go to an appointment, you could develop lasting (chronic) problems with pain. If there is any problem keeping an appointment, you must call to reschedule. °SEEK MEDICAL CARE IF:  °· You think you are having problems from the medicine you are taking. Read your medicine instructions carefully. °· Your chest pain does not go away, even after treatment. °· You develop a rash with blisters on your chest. °SEEK IMMEDIATE MEDICAL CARE IF:  °· You have increased chest pain or pain that spreads to your arm, neck, jaw, back, or abdomen. °· You develop shortness of breath, an increasing cough, or you are coughing up blood. °· You have severe back or abdominal pain, feel nauseous, or vomit. °· You develop severe weakness, fainting, or chills. °· You have a fever. °THIS IS AN EMERGENCY. Do not wait to see if the pain will go away. Get medical help at once. Call your local emergency services (911 in [place] Do not drive yourself to the hospital. °MAKE SURE YOU:  °· Understand these instructions. °· Will watch your condition. °· Will get help right away if you are not doing well or get worse. °Document Released: 02/11/2005 Document Revised: 07/27/2011 Document Reviewed: 12/08/2007 °Exit[employer]® Patient Information ©2014 [employer]. ° °

## 2013-10-04 NOTE — ED Provider Notes (Signed)
CSN: [id number]     Arrival date & time 10/03/13  2208 History   First MD Initiated Contact with Patient 10/04/13 0010     Chief Complaint  Patient presents with  . Shoulder Pain     (Consider location/radiation/quality/duration/timing/severity/associated sxs/prior Treatment) HPI History per patient. Left-sided chest pain radiates to her shoulder back and is worse with deep inspiration. She has some exertional dyspnea but denies shortness of breath otherwise. No cough or fevers. Onset a few days ago worse tonight. No nausea vomiting. No trauma. No rash. No history of same. Currently on hormone therapy trying to get pregnant, had a recent ultrasound. No weakness or numbness. No history of DVT or PE  Past Medical History  Diagnosis Date  . Depression    Past Surgical History  Procedure Laterality Date  . Dilation and curettage of uterus    . Diagnostic laparoscopy    . Laparoscopy Left 06/07/2013    Procedure: LAPAROSCOPY DIAGNOSTIC with aspiration of left peritubal cyst  ;  Surgeon: Governor Specking, MD;  Location: [facility];  Service: Gynecology;  Laterality: Left;  . Hysteroscopy w/d&c Bilateral 06/07/2013    Procedure: DILATATION AND CURETTAGE /HYSTEROSCOPY with bilateral tubal cathetrization  ;  Surgeon: Governor Specking, MD;  Location: [facility];  Service: Gynecology;  Laterality: Bilateral;  . Laparotomy Right 06/07/2013    Procedure: LAPAROTOMY with bilateral corneal segmental salpingectomy right corneal anastamosis;  Surgeon: Governor Specking, MD;  Location: [facility];  Service: Gynecology;  Laterality: Right;   No family history on file. History  Substance Use Topics  . Smoking status: Never Smoker   . Smokeless tobacco: Never Used  . Alcohol Use: No   OB History   Grav Para Term Preterm Abortions TAB SAB Ect Mult Living                 Review of Systems  Constitutional: Negative for fever and chills.  Eyes: Negative for visual disturbance.  Respiratory: Negative for cough and  wheezing.   Cardiovascular: Positive for chest pain.  Gastrointestinal: Negative for vomiting and abdominal pain.  Genitourinary: Negative for flank pain.  Musculoskeletal: Negative for neck stiffness.  Skin: Negative for rash.  Neurological: Negative for headaches.  All other systems reviewed and are negative.     Allergies  Review of patient's allergies indicates no known allergies.  Home Medications   Prior to Admission medications   Medication Sig Start Date End Date Taking? Authorizing Provider  letrozole (FEMARA) 2.5 MG tablet Take 7.5 mg by mouth daily. Days 3-7   Yes Historical Provider, MD   BP 127/75  Pulse 84  Temp(Src) 98.6 F (37 C) (Oral)  Resp 14  Ht 5\' 2"  (1.575 m)  Wt 235 lb (106.595 kg)  BMI 42.97 kg/m2  SpO2 98%  LMP 09/21/2013 Physical Exam  Constitutional: She is oriented to person, place, and time. She appears well-developed and well-nourished.  HENT:  Head: Normocephalic and atraumatic.  Eyes: EOM are normal. Pupils are equal, round, and reactive to light.  Neck: Neck supple.  Cardiovascular: Normal rate, regular rhythm and intact distal pulses.   Pulmonary/Chest: Effort normal and breath sounds normal. No respiratory distress. She exhibits no tenderness.  Abdominal: Soft. Bowel sounds are normal. She exhibits no distension. There is no tenderness.  Musculoskeletal: Normal range of motion. She exhibits no tenderness.  No reproducible tenderness to chest wall, shoulder or back. I am unable to appreciate any obvious swelling left upper extremity. Equal radial pulses. No palpable cords. No  erythema or increased warmth to touch. Full range of motion. No calf tenderness or swelling.  Neurological: She is alert and oriented to person, place, and time.  Skin: Skin is warm and dry.    ED Course  Procedures (including critical care time) Labs Review Labs Reviewed  COMPREHENSIVE METABOLIC PANEL - Abnormal; Notable for the following:    Glucose, Bld 100  (*)    Total Bilirubin 0.2 (*)    All other components within normal limits  CBC WITH DIFFERENTIAL  I-STAT TROPOININ, ED  I-STAT TROPOININ, ED  POC URINE PREG, ED    Imaging Review Ct Angio Chest Pe W/cm &/or Wo Cm  10/04/2013   CLINICAL DATA:  Left-sided shoulder pain radiating into the left upper back for the past several days. Symptoms are worse with deep inspiration.  EXAM: CT ANGIOGRAPHY CHEST WITH CONTRAST  TECHNIQUE: Multidetector CT imaging of the chest was performed using the standard protocol during bolus administration of intravenous contrast. Multiplanar CT image reconstructions and MIPs were obtained to evaluate the vascular anatomy.  CONTRAST:  125mL OMNIPAQUE IOHEXOL 350 MG/ML SOLN  COMPARISON:  No priors.  FINDINGS: Mediastinum: There are no filling defects within the pulmonary arterial tree to suggest underlying pulmonary embolism. Heart size is mildly enlarged. There is no significant pericardial fluid, thickening or pericardial calcification. No pathologically enlarged mediastinal or hilar lymph nodes. Esophagus is unremarkable in appearance.  Lungs/Pleura: No acute consolidative airspace disease. No pleural effusions. 7 mm pleural-based nodule in the periphery of the left lower lobe (image 54 of series 706). 4 mm subpleural nodule in the periphery of the left lower lobe also noted on image 49 of series 706. Evidence of mild air trapping throughout the lung bases bilaterally, indicative of small airways disease.  Upper Abdomen: Diffuse low attenuation throughout the hepatic parenchyma, compatible with severe hepatic steatosis.  Musculoskeletal: There are no aggressive appearing lytic or blastic lesions noted in the visualized portions of the skeleton.  Review of the MIP images confirms the above findings.  IMPRESSION: 1. No evidence of pulmonary embolism. 2. Mild air trapping in the lung bases bilaterally, indicative of small airways disease. 3. 2 subpleural nodules in the periphery of  the left lower lobe, largest of which measures up to 7 mm. If the patient is at high risk for bronchogenic carcinoma, follow-up chest CT at 3-56months is recommended. If the patient is at low risk for bronchogenic carcinoma, follow-up chest CT at 6-12 months is recommended. This recommendation follows the consensus statement: Guidelines for Management of Small Pulmonary Nodules Detected on CT Scans: A Statement from the [place] as published in Radiology 2005; 237:395-400. 4. Severe hepatic steatosis.   Electronically Signed   By: Vinnie Langton M.D.   On: 10/04/2013 02:50     EKG Interpretation   Date/Time:  Tuesday Oct 03 2013 69:48:54 EDT Ventricular Rate:  84 PR Interval:  160 QRS Duration: 82 QT Interval:  380 QTC Calculation: 449 R Axis:   55 Text Interpretation:  Normal sinus rhythm Nonspecific ST abnormality  Confirmed by Navaya Wiatrek  MD, Bhavik Cabiness ([id number]) on 10/04/2013 12:12:32 AM     IV fentanyl. IV Zofran. IV fluids.  3:41 AM on recheck pain is resolved and patient feels much better, is requesting to be discharged home. Plan followup with primary care physician at [facility]. Prescription for Motrin and Ultram provided. Strict return precautions verbalized as understood.  MDM   Diagnosis: Chest pain  38 year-old female with chest pain that radiates to her  back and her shoulder ongoing for last few days. No cough fevers or trauma. Evaluated with EKG, labs and imaging reviewed as above. Negative serial troponins. No PE. Patient is not a smoker and will follow up with her primary care physician for pulmonary nodules. Condition improved with IV narcotics. Vital signs and nursing notes reviewed and considered.    Teressa Lower, MD 10/04/13 [phone]

## 2014-04-16 ENCOUNTER — Emergency Department (INDEPENDENT_AMBULATORY_CARE_PROVIDER_SITE_OTHER): Payer: BC Managed Care – PPO

## 2014-04-16 ENCOUNTER — Emergency Department (INDEPENDENT_AMBULATORY_CARE_PROVIDER_SITE_OTHER)
Admission: EM | Admit: 2014-04-16 | Discharge: 2014-04-16 | Disposition: A | Discharge status: Home / Self Care | Payer: BC Managed Care – PPO | Source: Home / Self Care | Attending: Family Medicine | Admitting: Family Medicine

## 2014-04-16 ENCOUNTER — Encounter (HOSPITAL_COMMUNITY): Payer: Self-pay

## 2014-04-16 DIAGNOSIS — S60011A Contusion of right thumb without damage to nail, initial encounter: Secondary | ICD-10-CM

## 2014-04-16 NOTE — ED Provider Notes (Signed)
CSN: [id number]     Arrival date & time 04/16/14  15 History   First MD Initiated Contact with Patient 04/16/14 1846     Chief Complaint  Patient presents with  . Hand Injury   (Consider location/radiation/quality/duration/timing/severity/associated sxs/prior Treatment) Patient is a 38 y.o. female presenting with hand injury. The history is provided by the patient.  Hand Injury Location:  Finger Time since incident:  5 days Injury: yes   Mechanism of injury comment:  Struck thumb against door at home, still sore with rom. Finger location:  R thumb Pain details:    Quality:  Sharp Chronicity:  New Dislocation: no     Past Medical History  Diagnosis Date  . Depression    Past Surgical History  Procedure Laterality Date  . Dilation and curettage of uterus    . Diagnostic laparoscopy    . Laparoscopy Left 06/07/2013    Procedure: LAPAROSCOPY DIAGNOSTIC with aspiration of left peritubal cyst  ;  Surgeon: Governor Specking, MD;  Location: [facility];  Service: Gynecology;  Laterality: Left;  . Hysteroscopy w/d&c Bilateral 06/07/2013    Procedure: DILATATION AND CURETTAGE /HYSTEROSCOPY with bilateral tubal cathetrization  ;  Surgeon: Governor Specking, MD;  Location: [facility];  Service: Gynecology;  Laterality: Bilateral;  . Laparotomy Right 06/07/2013    Procedure: LAPAROTOMY with bilateral corneal segmental salpingectomy right corneal anastamosis;  Surgeon: Governor Specking, MD;  Location: [facility];  Service: Gynecology;  Laterality: Right;   History reviewed. No pertinent family history. History  Substance Use Topics  . Smoking status: Never Smoker   . Smokeless tobacco: Never Used  . Alcohol Use: No   OB History    No data available     Review of Systems  Constitutional: Negative.   Musculoskeletal: Negative for joint swelling.  Skin: Negative for wound.    Allergies  Review of patient's allergies indicates no known allergies.  Home Medications   Prior to Admission  medications   Medication Sig Start Date End Date Taking? Authorizing Provider  ibuprofen (ADVIL,MOTRIN) 600 MG tablet Take 1 tablet (600 mg total) by mouth every 6 (six) hours as needed. 10/04/13   Teressa Lower, MD  letrozole [facility]) 2.5 MG tablet Take 7.5 mg by mouth daily. Days 3-7    Historical Provider, MD  traMADol (ULTRAM) 50 MG tablet Take 1 tablet (50 mg total) by mouth every 6 (six) hours as needed. 10/04/13   Teressa Lower, MD   BP 153/89 mmHg  Pulse 76  Temp(Src) 98.3 F (36.8 C) (Oral)  Resp 16  SpO2 96%  LMP 04/01/2014 Physical Exam  Constitutional: She is oriented to person, place, and time. She appears well-developed and well-nourished.  Neck: Normal range of motion. Neck supple.  Musculoskeletal: She exhibits tenderness.  Tender thumb metacarpal on right, no sts or deformity, rom intact., skin intact.  Neurological: She is alert and oriented to person, place, and time.  Skin: Skin is warm and dry.  Nursing note and vitals reviewed.   ED Course  Procedures (including critical care time) Labs Review Labs Reviewed - No data to display  Imaging Review No results found.  X-rays reviewed and report per radiologist.  MDM   1. Contusion, thumb, right, initial encounter        Billy Fischer, MD 04/16/14 1901

## 2014-04-16 NOTE — Discharge Instructions (Signed)
Soak in warm water twice a day, advil and wear splint for comfort,

## 2014-04-16 NOTE — ED Notes (Signed)
C/o injury to right hand 5 days ago. Minimal relief w Caitlin [name]

## 2014-05-16 ENCOUNTER — Encounter (HOSPITAL_COMMUNITY): Payer: Self-pay | Admitting: Emergency Medicine

## 2014-05-16 ENCOUNTER — Emergency Department (HOSPITAL_COMMUNITY)
Admission: EM | Admit: 2014-05-16 | Discharge: 2014-05-17 | Disposition: A | Discharge status: Home / Self Care | Payer: BC Managed Care – PPO | Attending: Emergency Medicine | Admitting: Emergency Medicine

## 2014-05-16 ENCOUNTER — Emergency Department (HOSPITAL_COMMUNITY): Payer: BC Managed Care – PPO

## 2014-05-16 DIAGNOSIS — F329 Major depressive disorder, single episode, unspecified: Secondary | ICD-10-CM | POA: Diagnosis not present

## 2014-05-16 DIAGNOSIS — R0789 Other chest pain: Secondary | ICD-10-CM

## 2014-05-16 DIAGNOSIS — R079 Chest pain, unspecified: Secondary | ICD-10-CM | POA: Diagnosis present

## 2014-05-16 LAB — I-STAT TROPONIN, ED
TROPONIN I, POC: 0 ng/mL (ref 0.00–0.08)
Troponin i, poc: 0 ng/mL (ref 0.00–0.08)

## 2014-05-16 LAB — BASIC METABOLIC PANEL
Anion gap: 11 (ref 5–15)
BUN: 14 mg/dL (ref 6–23)
CO2: 22 mmol/L (ref 19–32)
CREATININE: 0.82 mg/dL (ref 0.50–1.10)
Calcium: 9.3 mg/dL (ref 8.4–10.5)
Chloride: 103 mEq/L (ref 96–112)
GFR calc Af Amer: >90 mL/min (ref >90)
GFR, EST NON AFRICAN AMERICAN: 88 mL/min — AB (ref >90)
GLUCOSE: 103 mg/dL — AB (ref 70–99)
Potassium: 3.8 mmol/L (ref 3.5–5.1)
SODIUM: 136 mmol/L (ref 135–145)

## 2014-05-16 LAB — CBC
HCT: 39.6 % (ref 36.0–46.0)
Hemoglobin: 13 g/dL (ref 12.0–15.0)
MCH: 28.6 pg (ref 26.0–34.0)
MCHC: 32.8 g/dL (ref 30.0–36.0)
MCV: 87.2 fL (ref 78.0–100.0)
PLATELETS: 271 10*3/uL (ref 150–400)
RBC: 4.54 MIL/uL (ref 3.87–5.11)
RDW: 12 % (ref 11.5–15.5)
WBC: 11.1 10*3/uL — ABNORMAL HIGH (ref 4.0–10.5)

## 2014-05-16 MED ORDER — OXYCODONE-ACETAMINOPHEN 5-325 MG PO TABS
1.0000 | ORAL_TABLET | Freq: Once | ORAL | Status: AC
  Administered 2014-05-16: 1 via ORAL
  Filled 2014-05-16: qty 1

## 2014-05-16 MED ORDER — KETOROLAC TROMETHAMINE 60 MG/2ML IM SOLN
60.0000 mg | Freq: Once | INTRAMUSCULAR | Status: AC
  Administered 2014-05-16: 60 mg via INTRAMUSCULAR
  Filled 2014-05-16: qty 2

## 2014-05-16 MED ORDER — KETOROLAC TROMETHAMINE 30 MG/ML IJ SOLN: 30.0000 mg | Freq: Once | INTRAMUSCULAR | Status: DC

## 2014-05-16 NOTE — ED Notes (Signed)
Lab called to inquire about BNP, did not receive in lab, will re-collect.

## 2014-05-16 NOTE — ED Provider Notes (Signed)
CSN: [id number]     Arrival date & time 05/16/14  2024 History   First MD Initiated Contact with Patient 05/16/14 2135     Chief Complaint  Patient presents with  . Chest Pain     (Consider location/radiation/quality/duration/timing/severity/associated sxs/prior Treatment) HPI Patient presents to the emergency department with left upper chest pain that radiates into the shoulder and scapula area.  She states it hurts worse with movement and palpation.  The patient states she was seen in May for similar complaints and had a CTA of her chest which did not show any signs of blood clot.  Patient denies shortness of breath, nausea, vomiting, diarrhea, weakness, dizziness, headache, blurred vision, neck pain, back pain, rash, diaphoresis, hematemesis, cough, runny nose, sore throat, or syncope.  Patient states that the pain has been constant since early this morning.  Does state that the pain is worse with deep breathing, movement and palpation Past Medical History  Diagnosis Date  . Depression    Past Surgical History  Procedure Laterality Date  . Dilation and curettage of uterus    . Diagnostic laparoscopy    . Laparoscopy Left 06/07/2013    Procedure: LAPAROSCOPY DIAGNOSTIC with aspiration of left peritubal cyst  ;  Surgeon: Governor Specking, MD;  Location: [facility];  Service: Gynecology;  Laterality: Left;  . Hysteroscopy w/d&c Bilateral 06/07/2013    Procedure: DILATATION AND CURETTAGE /HYSTEROSCOPY with bilateral tubal cathetrization  ;  Surgeon: Governor Specking, MD;  Location: [facility];  Service: Gynecology;  Laterality: Bilateral;  . Laparotomy Right 06/07/2013    Procedure: LAPAROTOMY with bilateral corneal segmental salpingectomy right corneal anastamosis;  Surgeon: Governor Specking, MD;  Location: [facility];  Service: Gynecology;  Laterality: Right;   No family history on file. History  Substance Use Topics  . Smoking status: Never Smoker   . Smokeless tobacco: Never Used  . Alcohol Use:  No   OB History    No data available     Review of Systems All other systems negative except as documented in the HPI. All pertinent positives and negatives as reviewed in the HPI.   Allergies  Review of patient's allergies indicates no known allergies.  Home Medications   Prior to Admission medications   Medication Sig Start Date End Date Taking? Authorizing Provider  letrozole (FEMARA) 2.5 MG tablet Take 7.5 mg by mouth See admin instructions. Takes on Days 3-7 of period   Yes Historical Provider, MD  naproxen sodium (ANAPROX) 220 MG tablet Take 220 mg by mouth daily as needed (for pain).   Yes Historical Provider, MD  ibuprofen (ADVIL,MOTRIN) 600 MG tablet Take 1 tablet (600 mg total) by mouth every 6 (six) hours as needed. 10/04/13   Teressa Lower, MD  traMADol (ULTRAM) 50 MG tablet Take 1 tablet (50 mg total) by mouth every 6 (six) hours as needed. 10/04/13   Teressa Lower, MD   BP 112/67 mmHg  Pulse 77  Temp(Src) 98.2 F (36.8 C) (Oral)  Resp 15  Ht 5\' 2"  (1.575 m)  Wt 217 lb (98.431 kg)  BMI 39.68 kg/m2  SpO2 99%  LMP 05/01/2014 Physical Exam  Constitutional: She is oriented to person, place, and time. She appears well-developed and well-nourished. No distress.  HENT:  Head: Normocephalic and atraumatic.  Mouth/Throat: Oropharynx is clear and moist.  Eyes: Pupils are equal, round, and reactive to light.  Neck: Normal range of motion. Neck supple.  Cardiovascular: Normal rate, regular rhythm and normal heart sounds.  Exam reveals  no gallop and no friction rub.   No murmur heard. Pulmonary/Chest: Effort normal and breath sounds normal. No respiratory distress.   She exhibits tenderness.    Abdominal: Soft. Bowel sounds are normal.  Neurological: She is alert and oriented to person, place, and time. She exhibits normal muscle tone. Coordination normal.  Skin: Skin is warm and dry. No rash noted. No erythema.  Psychiatric: She has a normal mood and affect. Her behavior  is normal.  Nursing note and vitals reviewed.   ED Course  Procedures (including critical care time) Labs Review Labs Reviewed  CBC - Abnormal; Notable for the following:    WBC 11.1 (*)    All other components within normal limits  BASIC METABOLIC PANEL - Abnormal; Notable for the following:    Glucose, Bld 103 (*)    GFR calc non Af Amer 88 (*)    All other components within normal limits  I-STAT TROPOININ, ED  I-STAT TROPOININ, ED    Imaging Review Dg Chest 2 View  05/16/2014   CLINICAL DATA:  Chest pain for 1 day  EXAM: CHEST  2 VIEW  COMPARISON:  Chest CT 10/04/2013  FINDINGS: Normal heart size and mediastinal contours. No acute infiltrate or edema. No effusion or pneumothorax. No acute osseous findings.  IMPRESSION: No active cardiopulmonary disease.   Electronically Signed   By: Jorje Guild M.D.   On: 05/16/2014 21:09     EKG Interpretation None     Return here as needed.  This appears to be muscular in nature based on the history of present illness.  Physical exam findings, along with her laboratory testing.  She had 2 sets of negative troponins, chest x-ray did not show any abnormality.  Patient is advised return here as needed.  Follow up with [facility] for recheck MDM   Final diagnoses:  Chest pain     Brent General, PA-C 05/17/14 4034  Ernestina Patches, MD 05/19/14 [phone]

## 2014-05-16 NOTE — ED Notes (Signed)
Pt. reports intermittent left chest pain radiating to left shoulder and left neck with mild SOB , pain worse with deep inspirations .

## 2014-05-17 MED ORDER — HYDROCODONE-ACETAMINOPHEN 5-325 MG PO TABS: 1.0000 | ORAL_TABLET | Freq: Four times a day (QID) | ORAL | Status: DC | PRN

## 2014-05-17 MED ORDER — IBUPROFEN 800 MG PO TABS: 800.0000 mg | ORAL_TABLET | Freq: Three times a day (TID) | ORAL | Status: DC | PRN

## 2014-05-17 NOTE — Discharge Instructions (Signed)
Return here as needed.  Follow-up with the clinic provided.  °

## 2014-05-29 ENCOUNTER — Emergency Department (INDEPENDENT_AMBULATORY_CARE_PROVIDER_SITE_OTHER): Payer: BLUE CROSS/BLUE SHIELD

## 2014-05-29 ENCOUNTER — Encounter (HOSPITAL_COMMUNITY): Payer: Self-pay | Admitting: Emergency Medicine

## 2014-05-29 ENCOUNTER — Emergency Department (HOSPITAL_COMMUNITY)
Admission: EM | Admit: 2014-05-29 | Discharge: 2014-05-29 | Disposition: A | Discharge status: Home / Self Care | Payer: BLUE CROSS/BLUE SHIELD | Source: Home / Self Care | Attending: Emergency Medicine | Admitting: Emergency Medicine

## 2014-05-29 DIAGNOSIS — S60011D Contusion of right thumb without damage to nail, subsequent encounter: Secondary | ICD-10-CM

## 2014-05-29 DIAGNOSIS — M25541 Pain in joints of right hand: Secondary | ICD-10-CM

## 2014-05-29 DIAGNOSIS — M79644 Pain in right finger(s): Secondary | ICD-10-CM

## 2014-05-29 NOTE — ED Notes (Signed)
Reports still having pain in right thumb.  Prior injury to thumb over a month ago.  No new injury.  Mild relief with taking aleve for pain.    States with movement of thumb there is pain and popping sound.

## 2014-05-29 NOTE — ED Provider Notes (Signed)
CSN: [id number]     Arrival date & time 05/29/14  1844 History   First MD Initiated Contact with Patient 05/29/14 1902     Chief Complaint  Patient presents with  . Hand Injury   (Consider location/radiation/quality/duration/timing/severity/associated sxs/prior Treatment) HPI Comments: 39 year old [name] presents with right thumb pain. Approximately 04/11/2014 she struck her right thumb on a door frame. She visited the urgent care at that time, evaluated with x-ray which was negative for fracture. There was some question about the scaphoid. She has been wearing a thumb spica splint since that time although she does take it off occasionally and when writing. She continues to complain of pain at the base of the thumb where there is tenderness and pain with movement. When flexing and extending at the MCP she feels and/or hears a popping sound.   Past Medical History  Diagnosis Date  . Depression    Past Surgical History  Procedure Laterality Date  . Dilation and curettage of uterus    . Diagnostic laparoscopy    . Laparoscopy Left 06/07/2013    Procedure: LAPAROSCOPY DIAGNOSTIC with aspiration of left peritubal cyst  ;  Surgeon: Governor Specking, MD;  Location: [facility];  Service: Gynecology;  Laterality: Left;  . Hysteroscopy w/d&c Bilateral 06/07/2013    Procedure: DILATATION AND CURETTAGE /HYSTEROSCOPY with bilateral tubal cathetrization  ;  Surgeon: Governor Specking, MD;  Location: [facility];  Service: Gynecology;  Laterality: Bilateral;  . Laparotomy Right 06/07/2013    Procedure: LAPAROTOMY with bilateral corneal segmental salpingectomy right corneal anastamosis;  Surgeon: Governor Specking, MD;  Location: [facility];  Service: Gynecology;  Laterality: Right;   History reviewed. No pertinent family history. History  Substance Use Topics  . Smoking status: Never Smoker   . Smokeless tobacco: Never Used  . Alcohol Use: No   OB History    No data available     Review of Systems   Constitutional: Negative.   Musculoskeletal: Negative for back pain, neck pain and neck stiffness.       As per history of present illness  Skin: Negative.   Neurological: Negative.     Allergies  Review of patient's allergies indicates no known allergies.  Home Medications   Prior to Admission medications   Medication Sig Start Date End Date Taking? Authorizing Provider  naproxen sodium (ANAPROX) 220 MG tablet Take 220 mg by mouth daily as needed (for pain).   Yes Historical Provider, MD  HYDROcodone-acetaminophen (NORCO/VICODIN) 5-325 MG per tablet Take 1 tablet by mouth every 6 (six) hours as needed for moderate pain. 05/17/14   Resa Miner Lawyer, PA-C  ibuprofen (ADVIL,MOTRIN) 800 MG tablet Take 1 tablet (800 mg total) by mouth every 8 (eight) hours as needed. 05/17/14   Resa Miner Lawyer, PA-C  letrozole [facility]) 2.5 MG tablet Take 7.5 mg by mouth See admin instructions. Takes on Days 3-7 of period    Historical Provider, MD  traMADol (ULTRAM) 50 MG tablet Take 1 tablet (50 mg total) by mouth every 6 (six) hours as needed. 10/04/13   Teressa Lower, MD   BP 124/Caitlin mmHg  Pulse 73  Temp(Src) 97.6 F (36.4 C) (Oral)  Resp 20  SpO2 97%  LMP 05/27/2014 Physical Exam  Constitutional: She is oriented to person, place, and time. She appears well-developed and well-nourished. No distress.  Pulmonary/Chest: Effort normal. No respiratory distress.  Musculoskeletal:  Tenderness to the base of the thumb at the MCP. There is also tenderness to the thenar eminence nearly attachment  at the MCP. Extension is normal and she is able to oppose the thumb. Distal neurovascular motor sensory is intact.  Neurological: She is alert and oriented to person, place, and time. She exhibits normal muscle tone.  Skin: Skin is warm and dry.  Psychiatric: She has a normal mood and affect.  Nursing note and vitals reviewed.   ED Course  Procedures (including critical care time) Labs Review Labs  Reviewed - No data to display  Imaging Review Dg Wrist Navic Only Right  05/29/2014   CLINICAL DATA:  Pain in right thumb with movement of right hand.  EXAM: DG WRIST NAVIC ONLY RIGHT  COMPARISON:  None.  FINDINGS: The scaphoid is intact. There is no fracture or dislocation. Carpal alignment is maintained. There is no evidence of arthropathy or other focal bone abnormality. Soft tissues are unremarkable.  IMPRESSION: Unremarkable radiograph of the navicular.   Electronically Signed   By: Jeb Levering M.D.   On: 05/29/2014 20:06   Dg Finger Thumb Right  05/29/2014   CLINICAL DATA:  Right hand pain with movement of right hand. Injury 1 month prior.  EXAM: RIGHT THUMB 2+V  COMPARISON:  Right thumb series 04/16/2014  FINDINGS: No fracture or dislocation. The alignment and joint spaces are maintained. No erosion or periosteal reaction. No healed or healing fracture. No focal soft tissue abnormality.  IMPRESSION: Unremarkable radiographs of the right thumb.   Electronically Signed   By: Jeb Levering M.D.   On: 05/29/2014 20:04     MDM   1. Thumb contusion, right, subsequent encounter   2. Pain in thumb joint with movement of right hand    COnt to wear splint Call Dr. Vanetta Shawl office tomorrow for appt.     Caitlin Napoleon, NP 05/29/14 2013

## 2014-05-29 NOTE — Discharge Instructions (Signed)
Continue to wear the splint. Call Dr. Vanetta Shawl office tomorrow for appointment.

## 2014-06-14 ENCOUNTER — Emergency Department (HOSPITAL_COMMUNITY)
Admission: EM | Admit: 2014-06-14 | Discharge: 2014-06-14 | Disposition: A | Discharge status: Home / Self Care | Payer: BLUE CROSS/BLUE SHIELD | Attending: Emergency Medicine | Admitting: Emergency Medicine

## 2014-06-14 ENCOUNTER — Emergency Department (HOSPITAL_COMMUNITY): Payer: BLUE CROSS/BLUE SHIELD

## 2014-06-14 ENCOUNTER — Encounter (HOSPITAL_COMMUNITY): Payer: Self-pay

## 2014-06-14 ENCOUNTER — Other Ambulatory Visit: Payer: Self-pay

## 2014-06-14 DIAGNOSIS — R0789 Other chest pain: Secondary | ICD-10-CM

## 2014-06-14 DIAGNOSIS — F329 Major depressive disorder, single episode, unspecified: Secondary | ICD-10-CM | POA: Insufficient documentation

## 2014-06-14 DIAGNOSIS — R079 Chest pain, unspecified: Secondary | ICD-10-CM | POA: Diagnosis present

## 2014-06-14 LAB — BASIC METABOLIC PANEL
Anion gap: 8 (ref 5–15)
BUN: 16 mg/dL (ref 6–23)
CALCIUM: 9.4 mg/dL (ref 8.4–10.5)
CO2: 27 mmol/L (ref 19–32)
Chloride: 104 mmol/L (ref 96–112)
Creatinine, Ser: 0.84 mg/dL (ref 0.50–1.10)
GFR calc non Af Amer: 86 mL/min — ABNORMAL LOW (ref >90)
Glucose, Bld: 84 mg/dL (ref 70–99)
Potassium: 4 mmol/L (ref 3.5–5.1)
Sodium: 139 mmol/L (ref 135–145)

## 2014-06-14 LAB — CBC
HCT: 38.5 % (ref 36.0–46.0)
Hemoglobin: 12.7 g/dL (ref 12.0–15.0)
MCH: 28.6 pg (ref 26.0–34.0)
MCHC: 33 g/dL (ref 30.0–36.0)
MCV: 86.7 fL (ref 78.0–100.0)
PLATELETS: 255 10*3/uL (ref 150–400)
RBC: 4.44 MIL/uL (ref 3.87–5.11)
RDW: 12.2 % (ref 11.5–15.5)
WBC: 8.7 10*3/uL (ref 4.0–10.5)

## 2014-06-14 LAB — I-STAT TROPONIN, ED: TROPONIN I, POC: 0 ng/mL (ref 0.00–0.08)

## 2014-06-14 MED ORDER — KETOROLAC TROMETHAMINE 60 MG/2ML IM SOLN
60.0000 mg | Freq: Once | INTRAMUSCULAR | Status: AC
  Administered 2014-06-14: 60 mg via INTRAMUSCULAR
  Filled 2014-06-14: qty 2

## 2014-06-14 NOTE — Discharge Instructions (Signed)
Chest Wall Pain Chest wall pain is pain in or around the bones and muscles of your chest. It may take up to 6 weeks to get better. It may take longer if you must stay physically active in your work and activities.  CAUSES  Chest wall pain may happen on its own. However, it may be caused by:  A viral illness like the flu.  Injury.  Coughing.  Exercise.  Arthritis.  Fibromyalgia.  Shingles. HOME CARE INSTRUCTIONS   Avoid overtiring physical activity. Try not to strain or perform activities that cause pain. This includes any activities using your chest or your abdominal and side muscles, especially if heavy weights are used.  Put ice on the sore area.  Put ice in a plastic bag.  Place a towel between your skin and the bag.  Leave the ice on for 15-20 minutes per hour while awake for the first 2 days.  Only take over-the-counter or prescription medicines for pain, discomfort, or fever as directed by your caregiver. SEEK IMMEDIATE MEDICAL CARE IF:   Your pain increases, or you are very uncomfortable.  You have a fever.  Your chest pain becomes worse.  You have new, unexplained symptoms.  You have nausea or vomiting.  You feel sweaty or lightheaded.  You have a cough with phlegm (sputum), or you cough up blood. MAKE SURE YOU:   Understand these instructions.  Will watch your condition.  Will get help right away if you are not doing well or get worse. Document Released: 05/04/2005 Document Revised: 07/27/2011 Document Reviewed: 12/29/2010 [facility] Patient Information 2015 [place], [place]. This information is not intended to replace advice given to you by your health care provider. Make sure you discuss any questions you have with your health care provider.  Musculoskeletal Pain Musculoskeletal pain is muscle and boney aches and pains. These pains can occur in any part of the body. Your caregiver may treat you without knowing the cause of the pain. They may treat you  if blood or urine tests, X-rays, and other tests were normal.  CAUSES There is often not a definite cause or reason for these pains. These pains may be caused by a type of germ (virus). The discomfort may also come from overuse. Overuse includes working out too hard when your body is not fit. Boney aches also come from weather changes. Bone is sensitive to atmospheric pressure changes. HOME CARE INSTRUCTIONS   Ask when your test results will be ready. Make sure you get your test results.  Only take over-the-counter or prescription medicines for pain, discomfort, or fever as directed by your caregiver. If you were given medications for your condition, do not drive, operate machinery or power tools, or sign legal documents for 24 hours. Do not drink alcohol. Do not take sleeping pills or other medications that may interfere with treatment.  Continue all activities unless the activities cause more pain. When the pain lessens, slowly resume normal activities. Gradually increase the intensity and duration of the activities or exercise.  During periods of severe pain, bed rest may be helpful. Lay or sit in any position that is comfortable.  Putting ice on the injured area.  Put ice in a bag.  Place a towel between your skin and the bag.  Leave the ice on for 15 to 20 minutes, 3 to 4 times a day.  Follow up with your caregiver for continued problems and no reason can be found for the pain. If the pain becomes worse  or does not go away, it may be necessary to repeat tests or do additional testing. Your caregiver may need to look further for a possible cause. SEEK IMMEDIATE MEDICAL CARE IF:  You have pain that is getting worse and is not relieved by medications.  You develop chest pain that is associated with shortness or breath, sweating, feeling sick to your stomach (nauseous), or throw up (vomit).  Your pain becomes localized to the abdomen.  You develop any new symptoms that seem different  or that concern you. MAKE SURE YOU:   Understand these instructions.  Will watch your condition.  Will get help right away if you are not doing well or get worse. Document Released: 05/04/2005 Document Revised: 07/27/2011 Document Reviewed: 01/06/2013 [facility] Patient Information 2015 [place], [place]. This information is not intended to replace advice given to you by your health care provider. Make sure you discuss any questions you have with your health care provider.

## 2014-06-14 NOTE — ED Provider Notes (Signed)
CSN: [id number]     Arrival date & time 06/14/14  1409 History   First MD Initiated Contact with Patient 06/14/14 1553     Chief Complaint  Patient presents with  . Chest Pain     (Consider location/radiation/quality/duration/timing/severity/associated sxs/prior Treatment) HPI  39 year old female presents with left-sided chest and back pain that radiates up to her shoulder for the past 13 hours. It started this morning at 3 AM. Has been on and off since then. The patient denies any worsening of symptoms with deep inspiration. No exertional symptoms. No cough or shortness of breath. Patient had similar symptoms about one month ago, was treated with pain medicines and pain resolved. The IM medicine she was given greatly relieved her pain, percocets at home made her sick. Never received expiration for where her pain came from. Also had symptoms a couple months prior to that and had a negative CTA of her chest. At this point the patient states the pain feels similar to those. Rates the pain as a 5/10. No prior history of diabetes, hypertension, hyperlipidemia, or smoking. No illicit drug use. Patient tried Aleve this morning with no significant relief. Pain worsens with different types of motions. Patient endorses this pain has occurred in between her ER visits but not as severe to cause her to come to ER.  Past Medical History  Diagnosis Date  . Depression    Past Surgical History  Procedure Laterality Date  . Dilation and curettage of uterus    . Diagnostic laparoscopy    . Laparoscopy Left 06/07/2013    Procedure: LAPAROSCOPY DIAGNOSTIC with aspiration of left peritubal cyst  ;  Surgeon: Governor Specking, MD;  Location: [facility];  Service: Gynecology;  Laterality: Left;  . Hysteroscopy w/d&c Bilateral 06/07/2013    Procedure: DILATATION AND CURETTAGE /HYSTEROSCOPY with bilateral tubal cathetrization  ;  Surgeon: Governor Specking, MD;  Location: [facility];  Service: Gynecology;  Laterality: Bilateral;   . Laparotomy Right 06/07/2013    Procedure: LAPAROTOMY with bilateral corneal segmental salpingectomy right corneal anastamosis;  Surgeon: Governor Specking, MD;  Location: [facility];  Service: Gynecology;  Laterality: Right;   History reviewed. No pertinent family history. History  Substance Use Topics  . Smoking status: Never Smoker   . Smokeless tobacco: Never Used  . Alcohol Use: No   OB History    No data available     Review of Systems  Constitutional: Negative for fever.  Respiratory: Negative for cough and shortness of breath.   Cardiovascular: Positive for chest pain.  Gastrointestinal: Negative for nausea, vomiting and abdominal pain.  Musculoskeletal: Positive for back pain and neck pain.  Neurological: Negative for weakness.  All other systems reviewed and are negative.     Allergies  Review of patient's allergies indicates no known allergies.  Home Medications   Prior to Admission medications   Medication Sig Start Date End Date Taking? Authorizing Provider  HYDROcodone-acetaminophen (NORCO/VICODIN) 5-325 MG per tablet Take 1 tablet by mouth every 6 (six) hours as needed for moderate pain. 05/17/14   Resa Miner Lawyer, PA-C  ibuprofen (ADVIL,MOTRIN) 800 MG tablet Take 1 tablet (800 mg total) by mouth every 8 (eight) hours as needed. 05/17/14   Resa Miner Lawyer, PA-C  letrozole [facility]) 2.5 MG tablet Take 7.5 mg by mouth See admin instructions. Takes on Days 3-7 of period    Historical Provider, MD  naproxen sodium (ANAPROX) 220 MG tablet Take 220 mg by mouth daily as needed (for pain).  Historical Provider, MD  traMADol (ULTRAM) 50 MG tablet Take 1 tablet (50 mg total) by mouth every 6 (six) hours as needed. 10/04/13   Teressa Lower, MD   BP 121/81 mmHg  Pulse 76  Temp(Src) 98.4 F (36.9 C) (Oral)  Resp 20  Ht 5\' 2"  (1.575 m)  Wt 215 lb (97.523 kg)  BMI 39.31 kg/m2  SpO2 99%  LMP 05/27/2014 Physical Exam  Constitutional: She is oriented to person,  place, and time. She appears well-developed and well-nourished. No distress.  HENT:  Head: Normocephalic and atraumatic.  Right Ear: External ear normal.  Left Ear: External ear normal.  Nose: Nose normal.  Eyes: Right eye exhibits no discharge. Left eye exhibits no discharge.  Cardiovascular: Normal rate, regular rhythm and normal heart sounds.   Pulmonary/Chest: Effort normal and breath sounds normal. She has no wheezes. She has no rales. She exhibits tenderness.    Abdominal: Soft. There is no tenderness.  Musculoskeletal:       Cervical back: She exhibits tenderness. She exhibits no bony tenderness.       Thoracic back: She exhibits tenderness. She exhibits no bony tenderness.       Back:  Neurological: She is alert and oriented to person, place, and time.  Skin: Skin is warm and dry. She is not diaphoretic.  Nursing note and vitals reviewed.   ED Course  Procedures (including critical care time) Labs Review Labs Reviewed  BASIC METABOLIC PANEL - Abnormal; Notable for the following:    GFR calc non Af Amer 86 (*)    All other components within normal limits  CBC  I-STAT TROPOININ, ED  POC URINE PREG, ED    Imaging Review Dg Chest 2 View  06/14/2014   CLINICAL DATA:  Left side chest pain, left shoulder pain for 1 day  EXAM: CHEST  2 VIEW  COMPARISON:  05/16/2014  FINDINGS: Cardiomediastinal silhouette is stable. No acute infiltrate or pleural effusion. No pulmonary edema. Bony thorax is unremarkable.  IMPRESSION: No active cardiopulmonary disease.   Electronically Signed   By: Lahoma Crocker M.D.   On: 06/14/2014 14:58    Date: 06/14/2014  Rate: 83  Rhythm: normal sinus rhythm  QRS Axis: normal  Intervals: normal  ST/T Wave abnormalities: normal  Conduction Disutrbances:none  Narrative Interpretation:   Old EKG Reviewed: unchanged    MDM   Final diagnoses:  Chest wall pain    Patient's pain appears to be musculoskeletal. She's had multiple similar presentations.  Given her onset of pain was over 12 hours ago, albeit intermittent, I believe one troponin is sufficient. This does not seem consistent with ACS. Previous workup for PE was negative, do not feel she needs repeat workup. Will given toradol IM, and d/c with NSAIDs and tylenol, as well as heat/ice. Patient to f/u with her PCP.     Ephraim Hamburger, MD 06/14/14 [phone]

## 2014-06-14 NOTE — ED Notes (Signed)
Pt reports chest pain that started this morning at 3 am.  Sts that she took aleve with no pain relief.  Pain is localized to left side of chest with radiation to shoulder, neck and jaw.  Seen and evaluated for same a month ago.

## 2014-06-26 ENCOUNTER — Other Ambulatory Visit (HOSPITAL_COMMUNITY): Payer: Self-pay | Admitting: Obstetrics & Gynecology

## 2014-06-26 DIAGNOSIS — N979 Female infertility, unspecified: Secondary | ICD-10-CM

## 2014-06-29 ENCOUNTER — Ambulatory Visit (HOSPITAL_COMMUNITY)
Admission: RE | Admit: 2014-06-29 | Discharge: 2014-06-29 | Disposition: A | Discharge status: Home / Self Care | Payer: BLUE CROSS/BLUE SHIELD | Source: Ambulatory Visit | Attending: Obstetrics & Gynecology | Admitting: Obstetrics & Gynecology

## 2014-06-29 DIAGNOSIS — N7011 Chronic salpingitis: Secondary | ICD-10-CM | POA: Diagnosis not present

## 2014-06-29 DIAGNOSIS — N979 Female infertility, unspecified: Secondary | ICD-10-CM

## 2014-06-29 MED ORDER — IOHEXOL 300 MG/ML  SOLN
20.0000 mL | Freq: Once | INTRAMUSCULAR | Status: AC | PRN
  Administered 2014-06-29: 20 mL

## 2014-10-26 ENCOUNTER — Other Ambulatory Visit: Payer: Self-pay | Admitting: Surgical Oncology

## 2014-10-31 ENCOUNTER — Other Ambulatory Visit: Payer: Self-pay

## 2014-11-13 ENCOUNTER — Ambulatory Visit
Admission: RE | Admit: 2014-11-13 | Discharge: 2014-11-13 | Disposition: A | Discharge status: Home / Self Care | Payer: BLUE CROSS/BLUE SHIELD | Source: Ambulatory Visit | Attending: Surgical Oncology | Admitting: Surgical Oncology

## 2015-03-11 IMAGING — RF DG HYSTEROGRAM
6 series · 6 of 6 positions shown · IV contrast (omnipaque)
Comparison: 03/29/2013

FLUOROSCOPY TIME:  60 seconds

CLINICAL DATA: Infertility, prior fallopian tube recanalization

EXAM:
HYSTEROSALPINGOGRAM
TECHNIQUE: Following cleansing of the cervix and vagina with Betadine solution,
a hysterosalpingogram was performed using a 5-French
hysterosalpingogram catheter and Omnipaque 300 contrast. The patient
tolerated the examination without difficulty.

[Series 1: run · 1 of 1 slices shown (1 of 6)]
[im 1/1]
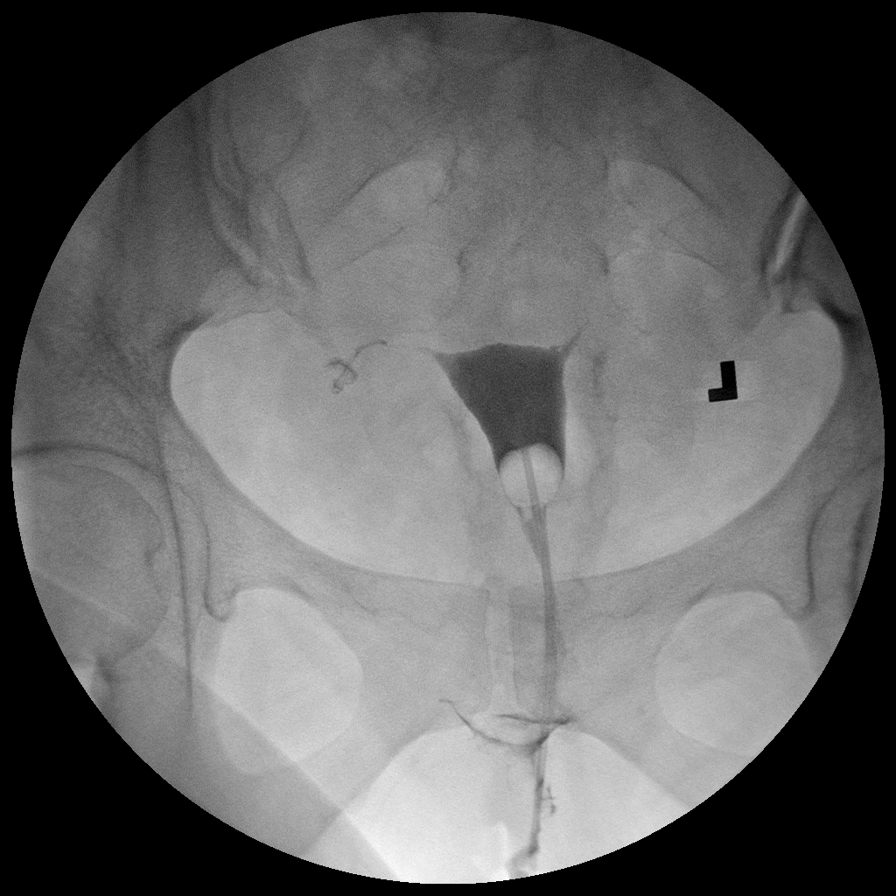

[Series 2: run · 1 of 1 slices shown (2 of 6)]
[im 1/1]
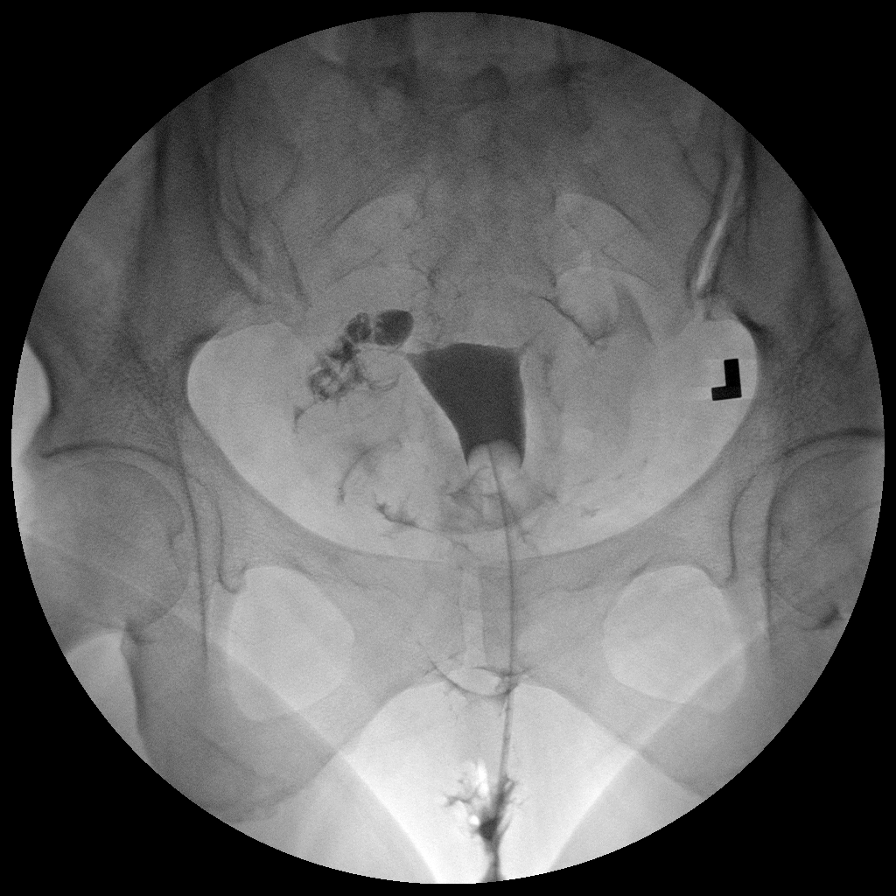

[Series 3: run · 1 of 1 slices shown (3 of 6)]
[im 1/1]
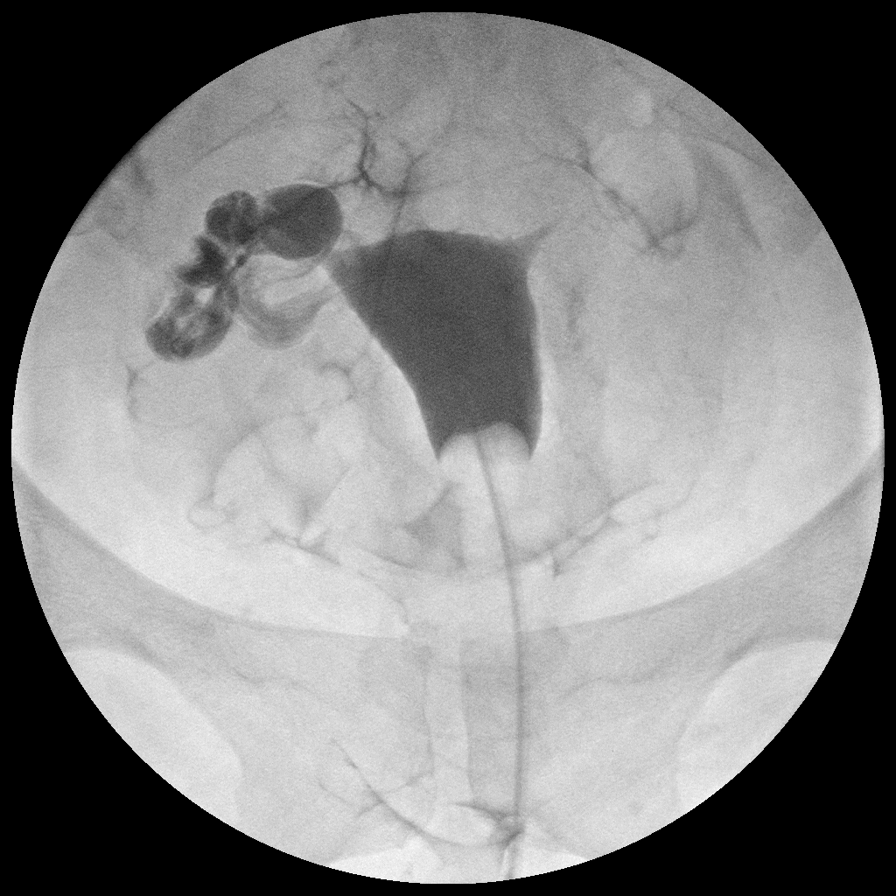

[Series 4: run · 1 of 1 slices shown (4 of 6)]
[im 1/1]
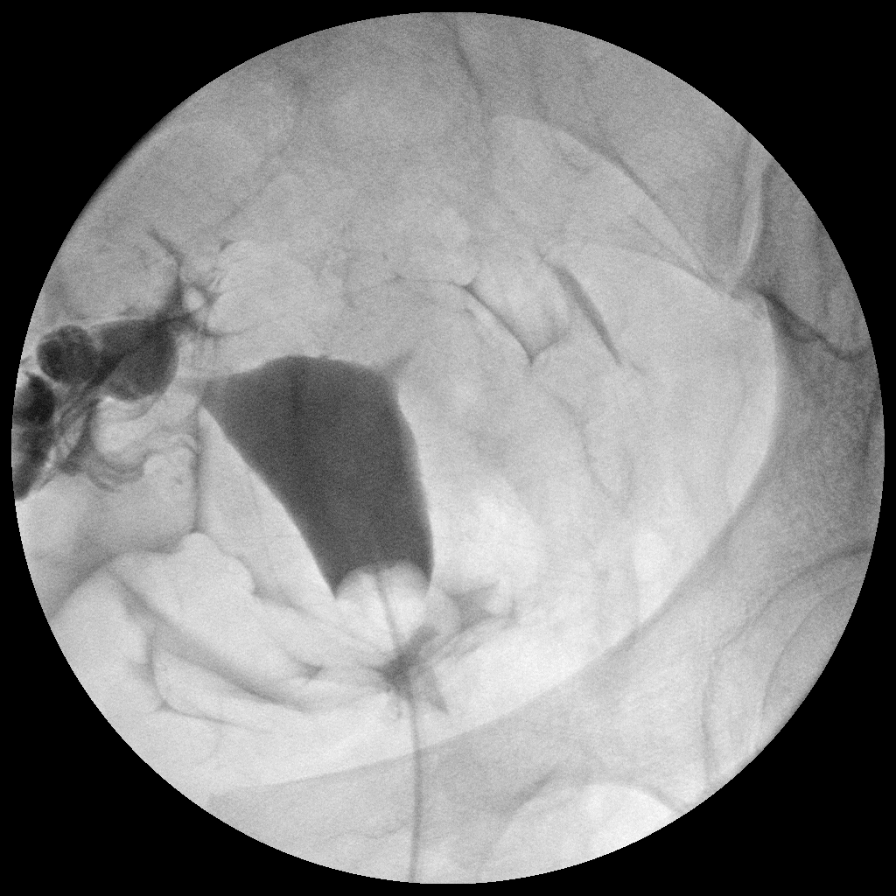

[Series 5: run · 1 of 1 slices shown (5 of 6)]
[im 1/1]
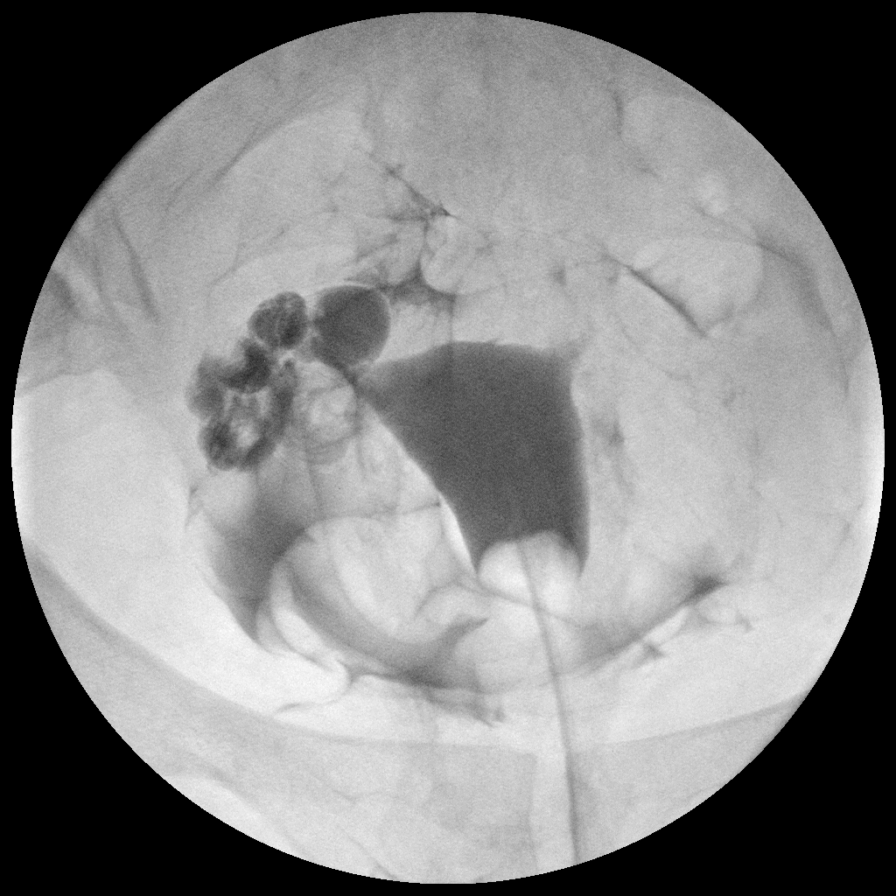

[Series 6: run · 1 of 1 slices shown (6 of 6)]
[im 1/1]
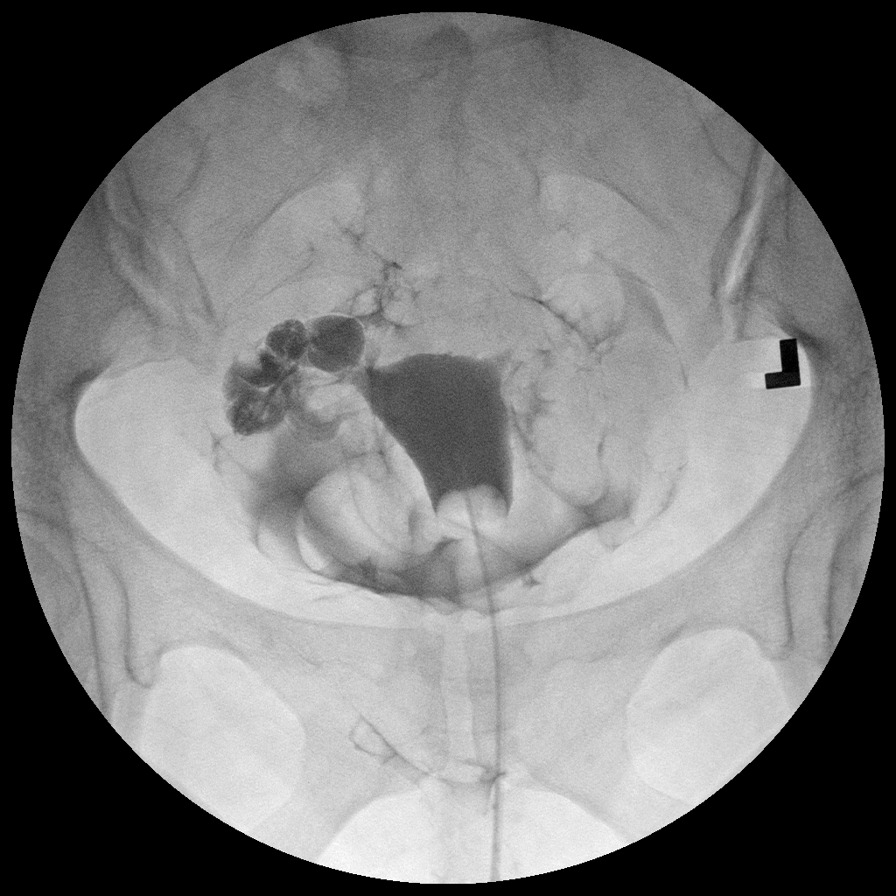

[6 of 6 positions shown; findings below may reference images not displayed]

FINDINGS: Please note that due to temporary shutdown of the fluoro Esmartsafe
shortly after intracavitary contrast administration, most images
obtained were delayed.

Normal early filling of the uterus and right fallopian tube. Dilated
distal right fallopian tube.

Normal peritoneal spill on the right.

Left fallopian tube is occluded proximally, within 1.5 cm of the
cornua. No peritoneal spill on the left.
IMPRESSION: Normal peritoneal spill on the right. Associated right hydrosalpinx.

Proximal fallopian tube occlusion on the left.  No peritoneal spill.

## 2015-03-26 ENCOUNTER — Ambulatory Visit
Admission: RE | Admit: 2015-03-26 | Discharge: 2015-03-26 | Disposition: A | Discharge status: Home / Self Care | Payer: BLUE CROSS/BLUE SHIELD | Source: Ambulatory Visit | Attending: Physician Assistant | Admitting: Physician Assistant

## 2015-03-26 ENCOUNTER — Other Ambulatory Visit: Payer: Self-pay | Admitting: Physician Assistant

## 2015-03-26 DIAGNOSIS — R52 Pain, unspecified: Secondary | ICD-10-CM

## 2015-05-02 ENCOUNTER — Other Ambulatory Visit (HOSPITAL_COMMUNITY): Payer: Self-pay | Admitting: Primary Care

## 2015-05-02 DIAGNOSIS — R911 Solitary pulmonary nodule: Secondary | ICD-10-CM

## 2015-05-08 ENCOUNTER — Encounter (HOSPITAL_COMMUNITY): Payer: Self-pay

## 2015-05-08 ENCOUNTER — Ambulatory Visit (HOSPITAL_COMMUNITY)
Admission: RE | Admit: 2015-05-08 | Discharge: 2015-05-08 | Disposition: A | Discharge status: Home / Self Care | Payer: BLUE CROSS/BLUE SHIELD | Source: Ambulatory Visit | Attending: Primary Care | Admitting: Primary Care

## 2015-05-08 DIAGNOSIS — R911 Solitary pulmonary nodule: Secondary | ICD-10-CM

## 2015-05-08 DIAGNOSIS — R918 Other nonspecific abnormal finding of lung field: Secondary | ICD-10-CM | POA: Diagnosis not present

## 2015-05-08 MED ORDER — IOHEXOL 300 MG/ML  SOLN
80.0000 mL | Freq: Once | INTRAMUSCULAR | Status: AC | PRN
  Administered 2015-05-08: 80 mL via INTRAVENOUS

## 2015-05-19 HISTORY — PX: GASTRIC BYPASS: SHX52

## 2015-06-03 ENCOUNTER — Other Ambulatory Visit: Payer: Self-pay | Admitting: Cardiology

## 2015-06-03 DIAGNOSIS — R079 Chest pain, unspecified: Secondary | ICD-10-CM

## 2015-06-07 ENCOUNTER — Encounter (HOSPITAL_COMMUNITY)
Admission: RE | Admit: 2015-06-07 | Discharge: 2015-06-07 | Disposition: A | Discharge status: Home / Self Care | Payer: BLUE CROSS/BLUE SHIELD | Source: Ambulatory Visit | Attending: Cardiology | Admitting: Cardiology

## 2015-06-07 ENCOUNTER — Ambulatory Visit (HOSPITAL_COMMUNITY)
Admission: RE | Admit: 2015-06-07 | Discharge: 2015-06-07 | Disposition: A | Discharge status: Home / Self Care | Payer: BLUE CROSS/BLUE SHIELD | Source: Ambulatory Visit | Attending: Cardiology | Admitting: Cardiology

## 2015-06-07 ENCOUNTER — Encounter (HOSPITAL_COMMUNITY): Payer: BLUE CROSS/BLUE SHIELD

## 2015-06-07 DIAGNOSIS — R079 Chest pain, unspecified: Secondary | ICD-10-CM

## 2015-06-07 DIAGNOSIS — R0602 Shortness of breath: Secondary | ICD-10-CM | POA: Insufficient documentation

## 2015-06-07 MED ORDER — TECHNETIUM TC 99M SESTAMIBI GENERIC - CARDIOLITE
30.0000 | Freq: Once | INTRAVENOUS | Status: AC | PRN
  Administered 2015-06-07: 30 via INTRAVENOUS

## 2015-06-07 MED ORDER — REGADENOSON 0.4 MG/5ML IV SOLN
INTRAVENOUS | Status: AC
  Filled 2015-06-07: qty 5

## 2015-06-07 MED ORDER — REGADENOSON 0.4 MG/5ML IV SOLN
0.4000 mg | Freq: Once | INTRAVENOUS | Status: AC
  Administered 2015-06-07: 0.4 mg via INTRAVENOUS

## 2015-06-07 MED ORDER — TECHNETIUM TC 99M SESTAMIBI GENERIC - CARDIOLITE
10.0000 | Freq: Once | INTRAVENOUS | Status: AC | PRN
  Administered 2015-06-07: 10 via INTRAVENOUS

## 2015-06-08 LAB — NM MYOCAR MULTI W/SPECT W/WALL MOTION / EF
CHL CUP STRESS STAGE 1 DBP: 80 mmHg
CHL CUP STRESS STAGE 1 GRADE: 0 %
CHL CUP STRESS STAGE 2 GRADE: 0 %
CHL CUP STRESS STAGE 2 HR: 76 {beats}/min
CHL CUP STRESS STAGE 2 SPEED: 0 mph
CHL CUP STRESS STAGE 3 DBP: 72 mmHg
CHL CUP STRESS STAGE 3 GRADE: 0 %
CHL CUP STRESS STAGE 3 HR: 106 {beats}/min
CHL CUP STRESS STAGE 3 SBP: 143 mmHg
CHL CUP STRESS STAGE 4 DBP: 74 mmHg
CHL CUP STRESS STAGE 4 HR: 99 {beats}/min
CHL CUP STRESS STAGE 4 SBP: 136 mmHg
CHL CUP STRESS STAGE 4 SPEED: 0 mph
CSEPPBP: 136 mmHg
Estimated workload: 1 METS
Peak HR: 99 {beats}/min
Percent of predicted max HR: 55 %
Stage 1 HR: 76 {beats}/min
Stage 1 SBP: 122 mmHg
Stage 1 Speed: 0 mph
Stage 3 Speed: 0 mph
Stage 4 Grade: 0 %

## 2015-11-11 ENCOUNTER — Encounter (HOSPITAL_COMMUNITY): Payer: Self-pay | Admitting: Emergency Medicine

## 2015-11-11 ENCOUNTER — Ambulatory Visit (HOSPITAL_COMMUNITY)
Admission: EM | Admit: 2015-11-11 | Discharge: 2015-11-11 | Disposition: A | Discharge status: Home / Self Care | Payer: BLUE CROSS/BLUE SHIELD | Attending: Family Medicine | Admitting: Family Medicine

## 2015-11-11 DIAGNOSIS — S93402A Sprain of unspecified ligament of left ankle, initial encounter: Secondary | ICD-10-CM

## 2015-11-11 NOTE — ED Provider Notes (Signed)
CSN: [id number]     Arrival date & time 11/11/15  1837 History   First MD Initiated Contact with Patient 11/11/15 2004     Chief Complaint  Patient presents with  . Ankle Pain   (Consider location/radiation/quality/duration/timing/severity/associated sxs/prior Treatment) Patient is a 40 y.o. female presenting with ankle pain. The history is provided by the patient.  Ankle Pain Location:  Ankle Time since incident:  12 hours Injury: yes   Mechanism of injury: fall   Mechanism of injury comment:  Tripped over dog leash this am.continues sore and swelling. Fall:    Fall occurred:  Tripped   Entrapped after fall: no   Ankle location:  L ankle Pain details:    Radiates to:  Does not radiate   Severity:  Mild Associated symptoms: no back pain     Past Medical History  Diagnosis Date  . Depression    Past Surgical History  Procedure Laterality Date  . Dilation and curettage of uterus    . Diagnostic laparoscopy    . Laparoscopy Left 06/07/2013    Procedure: LAPAROSCOPY DIAGNOSTIC with aspiration of left peritubal cyst  ;  Surgeon: Governor Specking, MD;  Location: [facility];  Service: Gynecology;  Laterality: Left;  . Hysteroscopy w/d&c Bilateral 06/07/2013    Procedure: DILATATION AND CURETTAGE /HYSTEROSCOPY with bilateral tubal cathetrization  ;  Surgeon: Governor Specking, MD;  Location: [facility];  Service: Gynecology;  Laterality: Bilateral;  . Laparotomy Right 06/07/2013    Procedure: LAPAROTOMY with bilateral corneal segmental salpingectomy right corneal anastamosis;  Surgeon: Governor Specking, MD;  Location: [facility];  Service: Gynecology;  Laterality: Right;   History reviewed. No pertinent family history. Social History  Substance Use Topics  . Smoking status: Never Smoker   . Smokeless tobacco: Never Used  . Alcohol Use: No   OB History    No data available     Review of Systems  Constitutional: Negative.   Musculoskeletal: Positive for gait problem. Negative for back pain  and joint swelling.  Skin: Negative.   All other systems reviewed and are negative.   Allergies  Review of patient's allergies indicates no known allergies.  Home Medications   Prior to Admission medications   Medication Sig Start Date End Date Taking? Authorizing Provider  HYDROcodone-acetaminophen (NORCO/VICODIN) 5-325 MG per tablet Take 1 tablet by mouth every 6 (six) hours as needed for moderate pain. 05/17/14   Dalia Heading, PA-C  ibuprofen (ADVIL,MOTRIN) 800 MG tablet Take 1 tablet (800 mg total) by mouth every 8 (eight) hours as needed. 05/17/14   Dalia Heading, PA-C  letrozole [facility]) 2.5 MG tablet Take 7.5 mg by mouth See admin instructions. Takes on Days 3-7 of period    Historical Provider, MD  naproxen sodium (ANAPROX) 220 MG tablet Take 220 mg by mouth daily as needed (for pain).    Historical Provider, MD  traMADol (ULTRAM) 50 MG tablet Take 1 tablet (50 mg total) by mouth every 6 (six) hours as needed. 10/04/13   Teressa Lower, MD   Meds Ordered and Administered this Visit  Medications - No data to display  BP 119/83 mmHg  Pulse 70  Temp(Src) 98.1 F (36.7 C) (Oral)  Resp 18  SpO2 96%  LMP 11/04/2015 (Exact Date) No data found.   Physical Exam  Constitutional: She is oriented to person, place, and time. She appears well-developed and well-nourished. No distress.  Musculoskeletal: She exhibits tenderness.       Left ankle: She exhibits swelling. She exhibits  normal range of motion, no ecchymosis, no deformity and normal pulse. Tenderness. Lateral malleolus tenderness found. No posterior TFL, no head of 5th metatarsal and no proximal fibula tenderness found. Achilles tendon normal. Achilles tendon exhibits no pain, no defect and normal Thompson's test results.       Feet:  Neurological: She is alert and oriented to person, place, and time.  Skin: Skin is warm and dry.  Nursing note and vitals reviewed.   ED Course  Procedures (including critical care  time)  Labs Review Labs Reviewed - No data to display  Imaging Review No results found.   Visual Acuity Review  Right Eye Distance:   Left Eye Distance:   Bilateral Distance:    Right Eye Near:   Left Eye Near:    Bilateral Near:         MDM   1. Ankle sprain, left, initial encounter        Billy Fischer, MD 11/11/15 2030

## 2015-11-11 NOTE — ED Notes (Signed)
The patient presented to the [facility] with a complaint of a left ankle injury secondary to a near fall that occurred today.

## 2015-11-11 NOTE — Discharge Instructions (Signed)
Wear ankle support as needed for comfort, activity as tolerated. advil medicine as needed, return or see orthopedist if further problems.

## 2016-10-20 DIAGNOSIS — F325 Major depressive disorder, single episode, in full remission: Secondary | ICD-10-CM | POA: Insufficient documentation

## 2017-05-17 DIAGNOSIS — E7800 Pure hypercholesterolemia, unspecified: Secondary | ICD-10-CM

## 2017-05-17 HISTORY — DX: Pure hypercholesterolemia, unspecified: E78.00

## 2018-12-05 DIAGNOSIS — R200 Anesthesia of skin: Secondary | ICD-10-CM | POA: Insufficient documentation

## 2018-12-06 ENCOUNTER — Other Ambulatory Visit: Payer: Self-pay

## 2018-12-06 DIAGNOSIS — R200 Anesthesia of skin: Secondary | ICD-10-CM

## 2018-12-16 ENCOUNTER — Other Ambulatory Visit: Payer: Self-pay | Admitting: Obstetrics and Gynecology

## 2018-12-16 DIAGNOSIS — R1900 Intra-abdominal and pelvic swelling, mass and lump, unspecified site: Secondary | ICD-10-CM

## 2019-01-03 ENCOUNTER — Other Ambulatory Visit: Payer: Self-pay

## 2019-01-03 ENCOUNTER — Ambulatory Visit (INDEPENDENT_AMBULATORY_CARE_PROVIDER_SITE_OTHER): Payer: Managed Care, Other (non HMO) | Admitting: Neurology

## 2019-01-03 DIAGNOSIS — G5601 Carpal tunnel syndrome, right upper limb: Secondary | ICD-10-CM

## 2019-01-03 DIAGNOSIS — R200 Anesthesia of skin: Secondary | ICD-10-CM | POA: Diagnosis not present

## 2019-01-03 NOTE — Procedures (Signed)
[facility]  [address] [place]  [place], [place] [address] Tel: [phone] Fax:  [phone] Test Date:  01/03/2019  Patient: Caitlin [name] DOB: Jan 17, 1976 Physician: Narda Amber, DO  Sex: Female Height: 5\' 2"  Ref Phys: Daryll Brod, MD  ID#: [id number] Temp 34.0 Technician:    Patient Complaints: This is a 43 year old female referred for evaluation of bilateral hand pain and paresthesias.  NCV & EMG Findings: Extensive electrodiagnostic testing of the right upper extremity and additional studies of the left shows:  1. Right mixed palmar sensory responses show prolonged latency (Median Palm, 2.3 ms).  Left mixed palmar and bilateral median and ulnar sensory responses are within normal limits.   2. Bilateral median and ulnar motor responses are within normal limits.   3. There is no evidence of active or chronic motor axonal loss changes affecting any of the tested muscles.  Motor unit configuration and recruitment pattern is within normal limits.    Impression: 1. Right median neuropathy at or distal to the wrist (very mild) consistent with a clinical diagnosis of carpal tunnel syndrome.   2. There is no evidence of a cervical radiculopathy affecting either upper extremity or left carpal tunnel syndrome.   ___________________________ Narda Amber, DO    Nerve Conduction Studies Anti Sensory Summary Table   Site NR Peak (ms) Norm Peak (ms) P-T Amp (V) Norm P-T Amp  Left Median Anti Sensory (2nd Digit)  34C  Wrist    3.1 <3.4 44.9 >20  Right Median Anti Sensory (2nd Digit)  34C  Wrist    3.1 <3.4 34.5 >20  Left Ulnar Anti Sensory (5th Digit)  34C  Wrist    2.6 <3.1 32.6 >12  Right Ulnar Anti Sensory (5th Digit)  34C  Wrist    2.4 <3.1 29.2 >12   Motor Summary Table   Site NR Onset (ms) Norm Onset (ms) O-P Amp (mV) Norm O-P Amp Site1 Site2 Delta-0 (ms) Dist (cm) Vel (m/s) Norm Vel (m/s)  Left Median Motor (Abd Poll Brev)  34C  Wrist    3.0 <3.9  10.5 >6 Elbow Wrist 4.0 26.0 65 >50  Elbow    7.0  10.0         Right Median Motor (Abd Poll Brev)  34C  Wrist    3.2 <3.9 12.3 >6 Elbow Wrist 4.1 26.0 63 >50  Elbow    7.3  12.0         Left Ulnar Motor (Abd Dig Minimi)  34C  Wrist    2.2 <3.1 9.9 >7 B Elbow Wrist 3.2 22.0 69 >50  B Elbow    5.4  9.4  A Elbow B Elbow 1.4 10.0 71 >50  A Elbow    6.8  9.2         Right Ulnar Motor (Abd Dig Minimi)  34C  Wrist    2.3 <3.1 9.7 >7 B Elbow Wrist 3.1 22.0 71 >50  B Elbow    5.4  9.7  A Elbow B Elbow 1.5 10.0 67 >50  A Elbow    6.9  9.4          Comparison Summary Table   Site NR Peak (ms) Norm Peak (ms) P-T Amp (V) Site1 Site2 Delta-P (ms) Norm Delta (ms)  Left Median/Ulnar Palm Comparison (Wrist - 8cm)  34C  Median Palm    1.9 <2.2 57.2 Median Palm Ulnar Palm 0.2   Ulnar Palm    1.7 <2.2 12.4  Right Median/Ulnar Palm Comparison (Wrist - 8cm)  34C  Median Palm    2.3 <2.2 22.8 Median Palm Ulnar Palm 0.8   Ulnar Palm    1.5 <2.2 9.2       EMG   Side Muscle Ins Act Fibs Psw Fasc Number Recrt Dur Dur. Amp Amp. Poly Poly. Comment  Right 1stDorInt Nml Nml Nml Nml Nml Nml Nml Nml Nml Nml Nml Nml N/A  Right Abd Poll Brev Nml Nml Nml Nml Nml Nml Nml Nml Nml Nml Nml Nml N/A  Right PronatorTeres Nml Nml Nml Nml Nml Nml Nml Nml Nml Nml Nml Nml N/A  Right Biceps Nml Nml Nml Nml Nml Nml Nml Nml Nml Nml Nml Nml N/A  Right Triceps Nml Nml Nml Nml Nml Nml Nml Nml Nml Nml Nml Nml N/A  Right Deltoid Nml Nml Nml Nml Nml Nml Nml Nml Nml Nml Nml Nml N/A  Left 1stDorInt Nml Nml Nml Nml Nml Nml Nml Nml Nml Nml Nml Nml N/A  Left PronatorTeres Nml Nml Nml Nml Nml Nml Nml Nml Nml Nml Nml Nml N/A  Left Biceps Nml Nml Nml Nml Nml Nml Nml Nml Nml Nml Nml Nml N/A  Left Triceps Nml Nml Nml Nml Nml Nml Nml Nml Nml Nml Nml Nml N/A  Left Deltoid Nml Nml Nml Nml Nml Nml Nml Nml Nml Nml Nml Nml N/A      Waveforms:

## 2019-01-12 ENCOUNTER — Other Ambulatory Visit: Payer: BLUE CROSS/BLUE SHIELD

## 2019-01-17 ENCOUNTER — Other Ambulatory Visit: Payer: Self-pay

## 2019-01-18 ENCOUNTER — Ambulatory Visit
Admission: RE | Admit: 2019-01-18 | Discharge: 2019-01-18 | Disposition: A | Discharge status: Home / Self Care | Payer: Self-pay | Source: Ambulatory Visit | Attending: Gynecologic Oncology | Admitting: Gynecologic Oncology

## 2019-01-18 ENCOUNTER — Other Ambulatory Visit: Payer: Self-pay | Admitting: Oncology

## 2019-01-18 DIAGNOSIS — N9489 Other specified conditions associated with female genital organs and menstrual cycle: Secondary | ICD-10-CM

## 2019-01-18 NOTE — Progress Notes (Signed)
Requested powershare of MRI from 01/03/19 from [place] [facility] in [place].

## 2019-01-19 ENCOUNTER — Other Ambulatory Visit: Payer: Self-pay

## 2019-01-19 ENCOUNTER — Inpatient Hospital Stay: Payer: Managed Care, Other (non HMO) | Attending: Gynecologic Oncology | Admitting: Gynecologic Oncology

## 2019-01-19 VITALS — BP 145/83 | HR 76 | Temp 98.9°F | Resp 18 | Ht 62.0 in | Wt 221.4 lb

## 2019-01-19 DIAGNOSIS — Z8041 Family history of malignant neoplasm of ovary: Secondary | ICD-10-CM | POA: Insufficient documentation

## 2019-01-19 DIAGNOSIS — N9489 Other specified conditions associated with female genital organs and menstrual cycle: Secondary | ICD-10-CM

## 2019-01-19 DIAGNOSIS — R102 Pelvic and perineal pain: Secondary | ICD-10-CM

## 2019-01-19 DIAGNOSIS — M25559 Pain in unspecified hip: Secondary | ICD-10-CM | POA: Insufficient documentation

## 2019-01-19 DIAGNOSIS — D398 Neoplasm of uncertain behavior of other specified female genital organs: Secondary | ICD-10-CM | POA: Diagnosis not present

## 2019-01-19 DIAGNOSIS — M25551 Pain in right hip: Secondary | ICD-10-CM | POA: Diagnosis not present

## 2019-01-19 NOTE — Progress Notes (Signed)
Consult Note: Gyn-Onc  Consult was requested by Dr. Murrell Redden for the evaluation of Caitlin [name] 43 y.o. female  CC: Right adnexal mass, pelvic pain  Assessment/Plan:  [name] is a 43 y.o. with a right adnexal structure measuring approximately 7.3 cm which appears separate from the ovary and the fallopian tube on MRI imaging.  The patient perceives the pain is more towards the back with anterior radiation.  Ca1 25 is within normal limits.  There is been small interval growth between imaging in June and July.  Notable is personal family history of a mother with ovarian cancer.  The pain is been present and persistent since April.  Differential includes a myoma of the round ligament, fallopian tube or other surrounding structures.  There is a possibility that this could be a retroperitoneal mass.  Recommended diagnostic laparoscopy with removal of the right adnexal mass if it is within the peritoneal cavity.  I have also recommended bilateral salpingectomy.  If the mass is retroperitoneal or even presacral she understands that the plan at that time will be for CT-guided biopsy.  The risks of the procedure were discussed inclusive of infection bleeding damage to surrounding structures prolonged hospitalization and reoperation.  She understands that the laparoscopic procedure may not identify a source .    She has a significant amount of concern because of her mother's death from ovarian cancer.  We discussed that in the event a gynecologic malignancy was identified, the appropriate surgical steps inclusive of staging and debulking will be performed.  Will identify a 3/8 neither here at [facility] long with Dr. Denman George or [facility].   HPI: [name] is a 43 y.o.   who reports right lower pelvic pain since April.  The pain is described in the lower pelvis towards the back with anterior radiation.  She states there is constant with no identifiable aggravating factors.  Pain is relieved with  Aleve.   -An ultrasound was collected on October 24, 2018 and was notable for a a right mass with mixed echogenicity just lateral to the right ovary measuring 6.3 x 4.5 x 3.3 cm.  It appeared separate from the right ovary and uterus.  Cannot rule out pedunculated fibroid versus bowel versus other.  The uterus measured 9.7 cm was noted to have endometrial stripe of 8.9 mm.  - An endometrial biopsy was collected and returned with proliferative type endometrium.   -A Ca1 25 was collected around that time and returned with a value of 15. -Pelvic  ultrasound was repeated on December 13, 2018 and showed that in the area of the right adnexa there was a mixed echogenic mass measuring 7.2 x 3.3 x 3.3 cm with slight vascularity.  The mass again was noted to be separate from the ovary and uterus.  The endometrium appeared within normal limits. -An MRI performed on January 03, 2019 is notable for a uterus measuring 5.8 x 9.3 x 5.8 cm with multiple uterine body intramural fibroids measuring up to 14 mm. Measured 2.6 x 3.0 with follicles.  Right adnexa measured 2.7 x 1.4 x 1.3 cm.  Abutting the anterior aspect of the right ovary was a well-defined oblong solid-appearing structure measuring 7.3 x 3.3 x 3.9 cm.  Trace fluid within the pelvis and notably there was a right iliac chain nodes measuring 14 mm.  There is no weight loss good appetite normal bowel or bladder habits. The patient's family history is notable for mother diagnosed with stage IIIa ovarian cancer at  the age of 30 years.  Review of Systems:  Constitutional  Feels well, very worried  cardiovascular  No chest pain, shortness of breath, or edema  Pulmonary  No cough or wheeze.  Gastro Intestinal  No nausea, vomitting, or diarrhoea. No bright red blood per rectum, no abdominal pain, change in bowel movement, or constipation.  Genito Urinary  No frequency, urgency, dysuria, or pelvic pain feels more towards the back with radiation to the front, pain is  pressure-like/sharp.  Relieved with Advil Aleve  musculo Skeletal  No myalgia, arthralgia, joint swelling or pain  Neurologic  No weakness, numbness, change in gait,  Psychology  No depression, anxiety, insomnia.    Current Meds:  Outpatient Encounter Medications as of 01/19/2019  Medication Sig  . atorvastatin (LIPITOR) 20 MG tablet Take by mouth.  Marland Kitchen BLISOVI 24 FE 1-20 MG-MCG(24) tablet    No facility-administered encounter medications on file as of 01/19/2019.     Allergy: No Known Allergies  Social Hx:   Social History   Socioeconomic History  . Marital status: Married    Spouse name: Not on file  . Number of children: Not on file  . Years of education: Not on file  . Highest education level: Not on file  Occupational History  . Not on file  Social Needs  . Financial resource strain: Not on file  . Food insecurity    Worry: Not on file    Inability: Not on file  . Transportation needs    Medical: Not on file    Non-medical: Not on file  Tobacco Use  . Smoking status: Never Smoker  . Smokeless tobacco: Never Used  Substance and Sexual Activity  . Alcohol use: No  . Drug use: No  . Sexual activity: Yes    Birth control/protection: None  Lifestyle  . Physical activity    Days per week: Not on file    Minutes per session: Not on file  . Stress: Not on file  Relationships  . Social Herbalist on phone: Not on file    Gets together: Not on file    Attends religious service: Not on file    Active member of club or organization: Not on file    Attends meetings of clubs or organizations: Not on file    Relationship status: Not on file  . Intimate partner violence    Fear of current or ex partner: Not on file    Emotionally abused: Not on file    Physically abused: Not on file    Forced sexual activity: Not on file  Other Topics Concern  . Not on file  Social History Narrative  . Not on file    Past Surgical Hx:  Past Surgical History:   Procedure Laterality Date  . DIAGNOSTIC LAPAROSCOPY    . DILATION AND CURETTAGE OF UTERUS    . GASTRIC BYPASS  2017  . HYSTEROSCOPY W/D&C Bilateral 06/07/2013   Procedure: DILATATION AND CURETTAGE /HYSTEROSCOPY with bilateral tubal cathetrization  ;  Surgeon: Governor Specking, MD;  Location: [facility];  Service: Gynecology;  Laterality: Bilateral;  . LAPAROSCOPY Left 06/07/2013   Procedure: LAPAROSCOPY DIAGNOSTIC with aspiration of left peritubal cyst  ;  Surgeon: Governor Specking, MD;  Location: [facility];  Service: Gynecology;  Laterality: Left;  . LAPAROTOMY Right 06/07/2013   Procedure: LAPAROTOMY with bilateral corneal segmental salpingectomy right corneal anastamosis;  Surgeon: Governor Specking, MD;  Location: [facility];  Service: Gynecology;  Laterality:  Right;    Past Medical Hx:  Past Medical History:  Diagnosis Date  . Bacterial vaginitis   . Depression   . Hypercholesterolemia 05/17/2017    Past Gynecological History: Menarche at age 45 regular menses last menstrual period 3 weeks ago.  Denies ever using birth control pills.  Denies any history of abnormal Pap test.  Gravida 3 para 1 normal spontaneous vaginal delivery  Family Hx:  Family History  Problem Relation Age of Onset  . Ovarian cancer Mother   . Diabetes Paternal Grandmother   . Breast cancer Neg Hx   . Colon cancer Neg Hx   Mother diagnosed with stage III ovarian cancer at age 6  Vitals:  Blood pressure (!) 145/83, pulse 76, temperature 98.9 F (37.2 C), temperature source Oral, resp. rate 18, height 5\' 2"  (1.575 m), weight 221 lb 6.4 oz (100.4 kg), SpO2 99 %. Body mass index is 40.49 kg/m.   Physical Exam: WD in NAD Neck  Supple NROM, without any enlargements.  Lymph Node Survey No cervical supraclavicular or inguinal adenopathy Cardiovascular  Pulse normal rate, regularity and rhythm. S1 and S2 normal.  Lungs  Clear to auscultation bilaterally, without wheezes/crackles/rhonchi. Good air movement.  Skin   No rash/lesions/breakdown  Psychiatry  Alert and oriented appropriate mood affect speech and reasoning. Abdomen  Normoactive bowel sounds, abdomen soft, non-tender.  No omental cake no fluid wave no tenderness  Back No CVA tenderness Genito Urinary  Vulva/vagina: Normal external female genitalia.  No lesions. No discharge or bleeding.  Bladder/urethra:  No lesions or masses  Vagina:no palpable masse  Cervix: Normal to palpation no masses  Uterus: Mobile, no parametrial involvement or nodularity.  Adnexa: No palpable masses. Rectal  Good tone, no masses no cul de sac nodularity.  Extremities  No bilateral cyanosis, clubbing 1+ edema bilaterally of the lower extremities. Gynecologic cancer awareness teal  tattoo on the right forearm  Janie Morning, MD, PhD 01/19/2019, 4:33 PM

## 2019-01-19 NOTE — Patient Instructions (Addendum)
Will plan for diagnostic Lsc BS, removal of adnexal mass, possible staging, debulking or other indicated procedures. Possibly at [facility].  We will contact you with potential dates once we have heard from [facility] as well. Available dates in [place] include end of September, beginning of October.  Thank you very much [name] for allowing me to provide care for you today.  I appreciate your confidence in choosing our Gynecologic Oncology team.  If you have any questions about your visit today please call our office and we will get back to you as soon as possible.  Please consider using the website Medlineplus.gov as an Geneticist, molecular.   [name]. [name] MD., PhD Gynecologic Oncology  Surgery in [facility]  Preparing for your Surgery  Pre-operative Testing -You will receive a phone call from presurgical testing at [facility] if you have not received a call already to arrange for a pre-operative testing appointment before your surgery.  This appointment normally occurs one to two weeks before your scheduled surgery.   -Bring your insurance card, copy of an advanced directive if applicable, medication list  -At that visit, you will be asked to sign a consent for a possible blood transfusion in case a transfusion becomes necessary during surgery.  The need for a blood transfusion is rare but having consent is a necessary part of your care.     -You should not be taking blood thinners or aspirin at least ten days prior to surgery unless instructed by your surgeon.  -Do not take supplements such as fish oil (omega 3), red yeast rice, tumeric before your surgery.  Day Before Surgery at [place] will be asked to take in a light diet the day before surgery.  Avoid carbonated beverages.  You will be advised to have nothing to eat or drink after midnight the evening before.    Eat a light diet the day before surgery.  Examples including soups, broths, toast, yogurt, mashed  potatoes.  Things to avoid include carbonated beverages (fizzy beverages), raw fruits and raw vegetables, or beans.   If your bowels are filled with gas, your surgeon will have difficulty visualizing your pelvic organs which increases your surgical risks.  Your role in recovery Your role is to become active as soon as directed by your doctor, while still giving yourself time to heal.  Rest when you feel tired. You will be asked to do the following in order to speed your recovery:  - Cough and breathe deeply. This helps toclear and expand your lungs and can prevent pneumonia. You may be given a spirometer to practice deep breathing. A staff member will show you how to use the spirometer. - Do mild physical activity. Walking or moving your legs help your circulation and body functions return to normal. A staff member will help you when you try to walk and will provide you with simple exercises. Do not try to get up or walk alone the first time. - Actively manage your pain. Managing your pain lets you move in comfort. We will ask you to rate your pain on a scale of zero to 10. It is your responsibility to tell your doctor or nurse where and how much you hurt so your pain can be treated.  Special Considerations -If you are diabetic, you may be placed on insulin after surgery to have closer control over your blood sugars to promote healing and recovery.  This does not mean that you will be discharged on  insulin.  If applicable, your oral antidiabetics will be resumed when you are tolerating a solid diet.  -Your final pathology results from surgery should be available around one week after surgery and the results will be relayed to you when available.  -Dr. Lahoma Crocker is the surgeon that assists your GYN Oncologist with surgery.  If you end up staying the night, the next day after your surgery you will either see Dr. Denman George or Dr. Lahoma Crocker.  -FMLA forms can be faxed to [phone] and  please allow 5-7 business days for completion.  Blood Transfusion Information WHAT IS A BLOOD TRANSFUSION? A transfusion is the replacement of blood or some of its parts. Blood is made up of multiple cells which provide different functions.  Red blood cells carry oxygen and are used for blood loss replacement.  White blood cells fight against infection.  Platelets control bleeding.  Plasma helps clot blood.  Other blood products are available for specialized needs, such as hemophilia or other clotting disorders. BEFORE THE TRANSFUSION  Who gives blood for transfusions?   You may be able to donate blood to be used at a later date on yourself (autologous donation).  Relatives can be asked to donate blood. This is generally not any safer than if you have received blood from a stranger. The same precautions are taken to ensure safety when a relative's blood is donated.  Healthy volunteers who are fully evaluated to make sure their blood is safe. This is blood bank blood. Transfusion therapy is the safest it has ever been in the practice of medicine. Before blood is taken from a donor, a complete history is taken to make sure that person has no history of diseases nor engages in risky social behavior (examples are intravenous drug use or sexual activity with multiple partners). The donor's travel history is screened to minimize risk of transmitting infections, such as malaria. The donated blood is tested for signs of infectious diseases, such as HIV and hepatitis. The blood is then tested to be sure it is compatible with you in order to minimize the chance of a transfusion reaction. If you or a relative donates blood, this is often done in anticipation of surgery and is not appropriate for emergency situations. It takes many days to process the donated blood. RISKS AND COMPLICATIONS Although transfusion therapy is very safe and saves many lives, the main dangers of transfusion include:    Getting an infectious disease.  Developing a transfusion reaction. This is an allergic reaction to something in the blood you were given. Every precaution is taken to prevent this. The decision to have a blood transfusion has been considered carefully by your caregiver before blood is given. Blood is not given unless the benefits outweigh the risks.

## 2019-02-01 ENCOUNTER — Telehealth: Payer: Self-pay

## 2019-02-01 NOTE — Telephone Encounter (Signed)
E-mailed and faxed FMLA Forms to be filled out at [facility] as surgery will be there with Dr. Skeet Latch.

## 2019-02-16 ENCOUNTER — Ambulatory Visit: Payer: Managed Care, Other (non HMO) | Admitting: Neurology

## 2019-02-17 ENCOUNTER — Telehealth: Payer: Self-pay | Admitting: Oncology

## 2019-02-17 DIAGNOSIS — C482 Malignant neoplasm of peritoneum, unspecified: Secondary | ICD-10-CM

## 2019-02-17 NOTE — Telephone Encounter (Signed)
Left a message with new patient appointment on 02/23/19 at 10:15 with Dr. Alvy Bimler.  Requested a return call to confirm.  Patient called back and confirmed the appointment.

## 2019-02-20 ENCOUNTER — Encounter: Payer: Self-pay | Admitting: Hematology and Oncology

## 2019-02-20 DIAGNOSIS — C482 Malignant neoplasm of peritoneum, unspecified: Secondary | ICD-10-CM | POA: Insufficient documentation

## 2019-02-23 ENCOUNTER — Encounter: Payer: Self-pay | Admitting: Oncology

## 2019-02-23 ENCOUNTER — Encounter: Payer: Self-pay | Admitting: Hematology and Oncology

## 2019-02-23 ENCOUNTER — Other Ambulatory Visit: Payer: Managed Care, Other (non HMO)

## 2019-02-23 ENCOUNTER — Inpatient Hospital Stay: Payer: Managed Care, Other (non HMO) | Attending: Gynecologic Oncology | Admitting: Hematology and Oncology

## 2019-02-23 ENCOUNTER — Other Ambulatory Visit: Payer: Self-pay

## 2019-02-23 VITALS — BP 139/88 | HR 82 | Temp 98.9°F | Resp 18 | Ht 62.0 in | Wt 219.2 lb

## 2019-02-23 DIAGNOSIS — Z79899 Other long term (current) drug therapy: Secondary | ICD-10-CM | POA: Diagnosis not present

## 2019-02-23 DIAGNOSIS — Z8041 Family history of malignant neoplasm of ovary: Secondary | ICD-10-CM | POA: Insufficient documentation

## 2019-02-23 DIAGNOSIS — N760 Acute vaginitis: Secondary | ICD-10-CM | POA: Insufficient documentation

## 2019-02-23 DIAGNOSIS — Z90710 Acquired absence of both cervix and uterus: Secondary | ICD-10-CM | POA: Diagnosis not present

## 2019-02-23 DIAGNOSIS — F329 Major depressive disorder, single episode, unspecified: Secondary | ICD-10-CM | POA: Insufficient documentation

## 2019-02-23 DIAGNOSIS — C482 Malignant neoplasm of peritoneum, unspecified: Secondary | ICD-10-CM | POA: Insufficient documentation

## 2019-02-23 DIAGNOSIS — Z90722 Acquired absence of ovaries, bilateral: Secondary | ICD-10-CM

## 2019-02-23 DIAGNOSIS — E7800 Pure hypercholesterolemia, unspecified: Secondary | ICD-10-CM | POA: Diagnosis not present

## 2019-02-23 DIAGNOSIS — Z5111 Encounter for antineoplastic chemotherapy: Secondary | ICD-10-CM | POA: Insufficient documentation

## 2019-02-23 DIAGNOSIS — Z7189 Other specified counseling: Secondary | ICD-10-CM | POA: Insufficient documentation

## 2019-02-23 MED ORDER — LIDOCAINE-PRILOCAINE 2.5-2.5 % EX CREA: TOPICAL_CREAM | CUTANEOUS | 3 refills | Status: DC

## 2019-02-23 MED ORDER — PROCHLORPERAZINE MALEATE 10 MG PO TABS: 10.0000 mg | ORAL_TABLET | Freq: Four times a day (QID) | ORAL | 1 refills | Status: DC | PRN

## 2019-02-23 MED ORDER — DEXAMETHASONE 4 MG PO TABS: ORAL_TABLET | ORAL | 9 refills | Status: DC

## 2019-02-23 MED ORDER — ONDANSETRON HCL 8 MG PO TABS: 8.0000 mg | ORAL_TABLET | Freq: Three times a day (TID) | ORAL | 1 refills | Status: DC | PRN

## 2019-02-23 MED FILL — DEXAMETHASONE 4 MG TABLET: 4 | 21 days supply | Qty: 4 | Fill #0

## 2019-02-23 MED FILL — LIDOCAINE-PRILOCAINE CREAM: 2.5-2.5 | 30 days supply | Qty: 30 | Fill #0

## 2019-02-23 MED FILL — PROCHLORPERAZINE 10 MG TAB: 10 | 30 days supply | Qty: 30 | Fill #0

## 2019-02-23 MED FILL — ONDANSETRON HCL 8 MG TABLET: 8 | 21 days supply | Qty: 18 | Fill #0

## 2019-02-23 NOTE — Assessment & Plan Note (Signed)
The goals of care is of curative intent Due to risk of infection associated with treatment, I recommend her not to work until her chemotherapy is completed

## 2019-02-23 NOTE — Progress Notes (Signed)
START ON PATHWAY REGIMEN - Ovarian     A cycle is every 21 days:     Paclitaxel      Carboplatin   **Always confirm dose/schedule in your pharmacy ordering system**  Patient Characteristics: Postoperative without Neoadjuvant Therapy (Pathologic Staging), Newly Diagnosed, Adjuvant Therapy, Stage IIA Therapeutic Status: Postoperative without Neoadjuvant Therapy (Pathologic Staging) BRCA Mutation Status: Awaiting Test Results AJCC 8 Stage Grouping: II AJCC M Category: cM0 AJCC T Category: pT2 AJCC N Category: pN0 Intent of Therapy: Curative Intent, Discussed with Patient 

## 2019-02-23 NOTE — Progress Notes (Signed)
Met with Caitlin [name] after her appointment with Dr. Alvy Bimler.  Provided her with the [place] folder and encouraged her to call with questions.

## 2019-02-23 NOTE — Progress Notes (Signed)
[place] NOTE  Patient Care Team: Amesti. 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 The intent is of curative intent.  We discussed some of the risks, benefits, side-effects of carboplatin & Taxol. Treatment is intravenous, every 3 weeks x 6 cycles  Some of the short term side-effects included, though not limited to, including weight loss, life threatening infections, risk of allergic reactions, need for transfusions of blood products, nausea, vomiting, change in bowel habits, loss of hair, admission to hospital for various reasons, and risks of death.   Long term side-effects are also discussed including risks of infertility, permanent damage to nerve function, hearing loss, chronic fatigue, kidney damage with possibility needing hemodialysis, and rare secondary malignancy including bone marrow disorders.  The patient is aware that the response rates discussed earlier is not guaranteed.  After a long discussion, patient made an informed decision to proceed with the prescribed plan of care.   Patient education material was dispensed. We discussed premedication with dexamethasone before chemotherapy.  We discussed genetics referral, blood work, port placement and chemo education class Recommend 4-6 cycles of chemotherapy followed by repeat baseline imaging study I do not plan prophylactic G-CSF support given her young age I will get her started on chemotherapy around October 19 I will see her before cycle 2 of treatment  Goals of care, counseling/discussion The goals of care is of curative intent Due to risk of infection associated with treatment, I recommend her not to work until her chemotherapy is completed   Orders Placed This Encounter  Procedures  . IR IMAGING GUIDED PORT INSERTION    Standing Status:   Future    Standing  Expiration Date:   04/24/2020    Order Specific Question:   Reason for Exam (SYMPTOM  OR DIAGNOSIS REQUIRED)    Answer:   need port for chemo to start 10/19    Order Specific Question:   Is the patient pregnant?    Answer:   No    Order Specific Question:   Preferred Imaging Location?    Answer:   [facility]  . CBC with Differential ([place] Only)    Standing Status:   Standing    Number of Occurrences:   20    Standing Expiration Date:   02/23/2020  . CMP ([place] only)    Standing Status:   Standing    Number of Occurrences:   20    Standing Expiration Date:   02/23/2020  . CA 125    Standing Status:   Standing    Number of Occurrences:   9    Standing Expiration Date:   02/23/2020     CHIEF COMPLAINTS/PURPOSE OF CONSULTATION:  Primary peritoneal cancer, strong family history of ovarian cancer, for further management  HISTORY OF PRESENTING ILLNESS:  Caitlin [name] 43 y.o. female is here because of history of recent cancer diagnosis The patient complained of abdominal discomfort since April She has strong family history of ovarian cancer in her mother She was subsequently followed with imaging study which came back abnormal She subsequently underwent surgery at [facility] and is referred here for adjuvant treatment  I have reviewed her chart and materials related to her cancer extensively and collaborated history with the patient. 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 These are unchanged from November 2017 and are benign in nature. No new nodules.    01/03/2019 Imaging   MRI pelvis Uterus: The uterus measures 5.8 x 9.3 x 5.8 cm. Unremarkable ENDOMETRIUM. Multiple uterine body intramural fibroids measuring up to 14 mm in size.  Mild endometrial fluid.    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 - Carcinoma appears to be arising in adnexal soft tissue, considered most consistent with stage pT2 (see comment) - Ovary with physiologic changes and and fallopian tube with paratubal cyst and surface adhesions; no definite parenchymal involvement by carcinoma identified - See synoptic report and comment  B: Lymph nodes, right common iliac, lymphadenectomy - Seven lymph nodes with no metastatic carcinoma identified (0/7)  C: Uterus with cervix and left ovary and fallopian tube, hysterectomy and left salpingo-oophorectomy Cervix: Ectocervix and endocervix with no dysplasia or malignancy identified Endometrium: Endometrium with hemorrhage and weakly proliferative features; no hyperplasia, atypia, or malignancy identified Myometrium: Adenomyosis and benign leiomyomata measuring up to 1.1 cm Ovary, left: Hemorrhagic follicular cyst, small serous inclusions, and physiologic changes Fallopian tube, left: Paratubal cysts and no malignancy identified  D: Lymph nodes, right pelvic, lymphadenectomy - Nine lymph nodes with no metastatic carcinoma identified (0/9)  E: Lymph nodes, left pelvic, lymphadenectomy - Three lymph nodes with no metastatic carcinoma identified (0/3)  F: Peritoneum, left abdominal, biopsy - Fibroadipose tissue with no malignancy identified  G: Peritoneum, right pelvic, biopsy - Fibroadipose tissue with no malignancy identified  H: Peritoneum, right abdominal, biopsy - Fibroadipose tissue with no malignancy identified  I: Peritoneum, left pelvic, biopsy - Fibroadipose tissue with no malignancy identified  J: Peritoneum, posterior cul de sac, biopsy - Fibroadipose tissue with calcifications and no malignancy identified  K: Peritoneum, anterior cul de sac, biopsy - Fibroadipose tissue with no malignancy  identified  L: Omentum, omentectomy - Adipose and fibrovascular tissue consistent with omentum  - No malignancy identified    02/08/2019 Surgery   Preoperative Diagnoses: Adnexal mass, Obesity BMI 40  Postoperative Diagnoses: Right adnexal mass, fallopian tube adenocarcinoma; Obesity BMI 40  Procedures: Diagnostic laparoscopy, Robotic-assisted total laparoscopic hysterectomy, bilateral salpingo-oophorectomy, bilateral pelvic lymphadenectomy, right common iliac lymphadenectomy, peritoneal biopsies, minilaparotomy for infragastric ometectomy  Surgeon: Janie Morning, MD, PhD  Findings: Normal upper abdominal survey: normal liver surface, diaphragm and stomach, normal appearing small and large bowel, normal omentum. No evidence of peritoneal disease or carcinomatosis. Small amount of ascites in the posterior cul-de-sac upon entry. Enlarged right adnexal mass (~8cm), appears to be arising from the fallopian tube with an otherwise normal appearing ovary. There was no intraoperative, intraperitoneal rupture of the mass; controlled removal of the mass occurred extraperitoneally in a bag. Normal appearing uterus and left adnexa. Normal appearing omentum. Patient with a short mesentery precluding adequate visualization of the aorta. Small nodules in the posterior cul-de-sac, biopsied; no additional peritoneal disease. R0 resection.    02/20/2019 Cancer Staging   Staging form: Ovary, Fallopian Tube, and Primary Peritoneal Carcinoma, AJCC 8th Edition - Pathologic: Stage II (pT2, pN0, cM0) - Signed by Heath Lark, MD on 02/20/2019    She is doing very well postoperatively She has some mild tenderness in the incision wound but no signs of bleeding or infection She denies recent weight loss No recent changes in bowel  habits  MEDICAL HISTORY:  Past Medical History:  Diagnosis Date  . Bacterial vaginitis   . Depression   . Hypercholesterolemia 05/17/2017    SURGICAL HISTORY: Past Surgical History:   Procedure Laterality Date  . DIAGNOSTIC LAPAROSCOPY    . DILATION AND CURETTAGE OF UTERUS    . GASTRIC BYPASS  2017  . HYSTEROSCOPY W/D&C Bilateral 06/07/2013   Procedure: DILATATION AND CURETTAGE /HYSTEROSCOPY with bilateral tubal cathetrization  ;  Surgeon: Governor Specking, MD;  Location: [facility];  Service: Gynecology;  Laterality: Bilateral;  . LAPAROSCOPY Left 06/07/2013   Procedure: LAPAROSCOPY DIAGNOSTIC with aspiration of left peritubal cyst  ;  Surgeon: Governor Specking, MD;  Location: [facility];  Service: Gynecology;  Laterality: Left;  . LAPAROTOMY Right 06/07/2013   Procedure: LAPAROTOMY with bilateral corneal segmental salpingectomy right corneal anastamosis;  Surgeon: Governor Specking, MD;  Location: [facility];  Service: Gynecology;  Laterality: Right;    SOCIAL HISTORY: Social History   Socioeconomic History  . Marital status: Married    Spouse name: Izora Gala  . Number of children: 1  . Years of education: Not on file  . Highest education level: Not on file  Occupational History  . Occupation: receptionist  Social Needs  . Financial resource strain: Not on file  . Food insecurity    Worry: Not on file    Inability: Not on file  . Transportation needs    Medical: Not on file    Non-medical: Not on file  Tobacco Use  . Smoking status: Never Smoker  . Smokeless tobacco: Never Used  Substance and Sexual Activity  . Alcohol use: No  . Drug use: No  . Sexual activity: Yes    Birth control/protection: None  Lifestyle  . Physical activity    Days per week: Not on file    Minutes per session: Not on file  . Stress: Not on file  Relationships  . Social Herbalist on phone: Not on file    Gets together: Not on file    Attends religious service: Not on file    Active member of club or organization: Not on file    Attends meetings of clubs or organizations: Not on file    Relationship status: Not on file  . Intimate partner violence    Fear of current or ex  partner: Not on file    Emotionally abused: Not on file    Physically abused: Not on file    Forced sexual activity: Not on file  Other Topics Concern  . Not on file  Social History Narrative  . Not on file    FAMILY HISTORY: Family History  Problem Relation Age of Onset  . Ovarian cancer Mother   . Diabetes Paternal Grandmother   . Breast cancer Neg Hx   . Colon cancer Neg Hx     ALLERGIES:  has No Known Allergies.  MEDICATIONS:  Current Outpatient Medications  Medication Sig Dispense Refill  . dexamethasone (DECADRON) 4 MG tablet Take 2 tabs at the night before and 2 tab the morning of chemotherapy, every 3 weeks, by mouth 24 tablet 9  . lidocaine-prilocaine (EMLA) cream Apply to affected area once 30 g 3  . ondansetron (ZOFRAN) 8 MG tablet Take 1 tablet (8 mg total) by mouth every 8 (eight) hours as needed for refractory nausea / vomiting. Start on day 3 after carboplatin chemo. 30 tablet 1  . prochlorperazine (COMPAZINE) 10 MG tablet Take 1 tablet (10 mg  total) by mouth every 6 (six) hours as needed (Nausea or vomiting). 30 tablet 1   No current facility-administered medications for this visit.     REVIEW OF SYSTEMS:   Constitutional: Denies fevers, chills or abnormal night sweats Eyes: Denies blurriness of vision, double vision or watery eyes Ears, nose, mouth, throat, and face: Denies mucositis or sore throat Respiratory: Denies cough, dyspnea or wheezes Cardiovascular: Denies palpitation, chest discomfort or lower extremity swelling Gastrointestinal:  Denies nausea, heartburn or change in bowel habits Skin: Denies abnormal skin rashes Lymphatics: Denies new lymphadenopathy or easy bruising Neurological:Denies numbness, tingling or new weaknesses Behavioral/Psych: Mood is stable, no new changes  All other systems were reviewed with the patient and are negative.  PHYSICAL EXAMINATION: ECOG PERFORMANCE STATUS: 1 - Symptomatic but completely ambulatory  Vitals:    02/23/19 1044  BP: 139/88  Pulse: 82  Resp: 18  Temp: 98.9 F (37.2 C)  SpO2: 100%   Filed Weights   02/23/19 1044  Weight: 219 lb 3.2 oz (99.4 kg)    GENERAL:alert, no distress and comfortable SKIN: skin color, texture, turgor are normal, no rashes or significant lesions EYES: normal, conjunctiva are pink and non-injected, sclera clear OROPHARYNX:no exudate, no erythema and lips, buccal mucosa, and tongue normal  NECK: supple, thyroid normal size, non-tender, without nodularity LYMPH:  no palpable lymphadenopathy in the cervical, axillary or inguinal LUNGS: clear to auscultation and percussion with normal breathing effort HEART: regular rate & rhythm and no murmurs and no lower extremity edema ABDOMEN:abdomen soft, non-tender and normal bowel sounds.  Well-healed surgical scars Musculoskeletal:no cyanosis of digits and no clubbing  PSYCH: alert & oriented x 3 with fluent speech NEURO: no focal motor/sensory deficits  LABORATORY DATA:  I have reviewed the data as listed Lab Results  Component Value Date   WBC 8.7 06/14/2014   HGB 12.7 06/14/2014   HCT 38.5 06/14/2014   MCV 86.7 06/14/2014   PLT 255 06/14/2014   No results for input(s): NA, K, CL, CO2, GLUCOSE, BUN, CREATININE, CALCIUM, GFRNONAA, GFRAA, PROT, ALBUMIN, AST, ALT, ALKPHOS, BILITOT, BILIDIR, IBILI in the last 8760 hours.  RADIOGRAPHIC STUDIES: I have personally reviewed the radiological images as listed and agreed with the findings in the report. No results found.  I spent 60 minutes counseling the patient face to face. The total time spent in the appointment was 80 minutes and more than 50% was on counseling.  All questions were answered. The patient knows to call the clinic with any problems, questions or concerns.  Heath Lark, MD 02/23/2019 2:28 PM

## 2019-02-23 NOTE — Assessment & Plan Note (Signed)
We reviewed the NCCN guidelines We discussed the role of chemotherapy. The intent is of curative intent.  We discussed some of the risks, benefits, side-effects of carboplatin & Taxol. Treatment is intravenous, every 3 weeks x 6 cycles  Some of the short term side-effects included, though not limited to, including weight loss, life threatening infections, risk of allergic reactions, need for transfusions of blood products, nausea, vomiting, change in bowel habits, loss of hair, admission to hospital for various reasons, and risks of death.   Long term side-effects are also discussed including risks of infertility, permanent damage to nerve function, hearing loss, chronic fatigue, kidney damage with possibility needing hemodialysis, and rare secondary malignancy including bone marrow disorders.  The patient is aware that the response rates discussed earlier is not guaranteed.  After a long discussion, patient made an informed decision to proceed with the prescribed plan of care.   Patient education material was dispensed. We discussed premedication with dexamethasone before chemotherapy.  We discussed genetics referral, blood work, port placement and chemo education class Recommend 4-6 cycles of chemotherapy followed by repeat baseline imaging study I do not plan prophylactic G-CSF support given her young age I will get her started on chemotherapy around October 19 I will see her before cycle 2 of treatment

## 2019-02-24 ENCOUNTER — Telehealth: Payer: Self-pay | Admitting: Hematology and Oncology

## 2019-02-24 NOTE — Telephone Encounter (Signed)
I talk with patient regarding schedule  

## 2019-02-27 ENCOUNTER — Other Ambulatory Visit: Payer: Managed Care, Other (non HMO)

## 2019-02-28 ENCOUNTER — Inpatient Hospital Stay (HOSPITAL_BASED_OUTPATIENT_CLINIC_OR_DEPARTMENT_OTHER): Payer: Managed Care, Other (non HMO) | Admitting: Genetic Counselor

## 2019-02-28 ENCOUNTER — Inpatient Hospital Stay: Payer: Managed Care, Other (non HMO)

## 2019-02-28 ENCOUNTER — Other Ambulatory Visit: Payer: Self-pay | Admitting: Hematology and Oncology

## 2019-02-28 ENCOUNTER — Other Ambulatory Visit: Payer: Self-pay | Admitting: Genetic Counselor

## 2019-02-28 ENCOUNTER — Encounter: Payer: Self-pay | Admitting: Genetic Counselor

## 2019-02-28 ENCOUNTER — Other Ambulatory Visit: Payer: Self-pay

## 2019-02-28 DIAGNOSIS — C482 Malignant neoplasm of peritoneum, unspecified: Secondary | ICD-10-CM

## 2019-02-28 DIAGNOSIS — Z5111 Encounter for antineoplastic chemotherapy: Secondary | ICD-10-CM | POA: Diagnosis not present

## 2019-02-28 DIAGNOSIS — Z8049 Family history of malignant neoplasm of other genital organs: Secondary | ICD-10-CM

## 2019-02-28 DIAGNOSIS — Z800 Family history of malignant neoplasm of digestive organs: Secondary | ICD-10-CM | POA: Diagnosis not present

## 2019-02-28 DIAGNOSIS — Z8041 Family history of malignant neoplasm of ovary: Secondary | ICD-10-CM | POA: Diagnosis not present

## 2019-02-28 DIAGNOSIS — Z1371 Encounter for nonprocreative screening for genetic disease carrier status: Secondary | ICD-10-CM

## 2019-02-28 LAB — CBC WITH DIFFERENTIAL (CANCER CENTER ONLY)
Abs Immature Granulocytes: 0.01 10*3/uL (ref 0.00–0.07)
Basophils Absolute: 0 10*3/uL (ref 0.0–0.1)
Basophils Relative: 0 %
Eosinophils Absolute: 0.2 10*3/uL (ref 0.0–0.5)
Eosinophils Relative: 4 %
HCT: 39.2 % (ref 36.0–46.0)
Hemoglobin: 12.4 g/dL (ref 12.0–15.0)
Immature Granulocytes: 0 %
Lymphocytes Relative: 23 %
Lymphs Abs: 1.4 10*3/uL (ref 0.7–4.0)
MCH: 27.7 pg (ref 26.0–34.0)
MCHC: 31.6 g/dL (ref 30.0–36.0)
MCV: 87.5 fL (ref 80.0–100.0)
Monocytes Absolute: 0.4 10*3/uL (ref 0.1–1.0)
Monocytes Relative: 6 %
Neutro Abs: 4.2 10*3/uL (ref 1.7–7.7)
Neutrophils Relative %: 67 %
Platelet Count: 317 10*3/uL (ref 150–400)
RBC: 4.48 MIL/uL (ref 3.87–5.11)
RDW: 11.4 % — ABNORMAL LOW (ref 11.5–15.5)
WBC Count: 6.2 10*3/uL (ref 4.0–10.5)
nRBC: 0 % (ref 0.0–0.2)

## 2019-02-28 LAB — CMP (CANCER CENTER ONLY)
ALT: 20 U/L (ref 0–44)
AST: 21 U/L (ref 15–41)
Albumin: 3.9 g/dL (ref 3.5–5.0)
Alkaline Phosphatase: 70 U/L (ref 38–126)
Anion gap: 9 (ref 5–15)
BUN: 15 mg/dL (ref 6–20)
CO2: 29 mmol/L (ref 22–32)
Calcium: 10 mg/dL (ref 8.9–10.3)
Chloride: 105 mmol/L (ref 98–111)
Creatinine: 0.72 mg/dL (ref 0.44–1.00)
GFR, Est AFR Am: >60 mL/min (ref >60)
GFR, Estimated: >60 mL/min (ref >60)
Glucose, Bld: 97 mg/dL (ref 70–99)
Potassium: 4.2 mmol/L (ref 3.5–5.1)
Sodium: 143 mmol/L (ref 135–145)
Total Bilirubin: 0.5 mg/dL (ref 0.3–1.2)
Total Protein: 7.6 g/dL (ref 6.5–8.1)

## 2019-02-28 NOTE — Progress Notes (Signed)
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 Family history of ovarian cancer   3. Family history of throat cancer   4. Family history of cervical cancer      HISTORY OF PRESENT ILLNESS:   Caitlin [name], a 43 y.o. female, was seen for a [facility] cancer genetics consultation at the request of Dr. Alvy Bimler due to a personal and family history of ovarian cancer.  Ms. Mclaurin presents to clinic today to discuss the possibility of a hereditary predisposition to cancer, genetic testing, and to further clarify her future cancer risks, as well as potential cancer risks for family members.   In 2020, at the age of 18, Ms. Rowen was diagnosed with primary peritoneal adenocarcinoma. The treatment plan includes surgery and chemotherapy.   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 These are unchanged from November 2017 and are benign in nature. No new nodules.    01/03/2019 Imaging   MRI pelvis Uterus: The uterus measures 5.8 x 9.3 x 5.8 cm. Unremarkable ENDOMETRIUM. Multiple uterine body intramural fibroids measuring up to 14 mm in size.  Mild endometrial fluid.   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 - Carcinoma appears to be arising in adnexal soft tissue, considered most consistent with stage pT2 (see comment) - Ovary with physiologic changes and and fallopian tube with paratubal cyst and surface adhesions; no definite parenchymal involvement by carcinoma identified - See synoptic report and comment  B: Lymph nodes, right common iliac,  lymphadenectomy - Seven lymph nodes with no metastatic carcinoma identified (0/7)  C: Uterus with cervix and left ovary and fallopian tube, hysterectomy and left salpingo-oophorectomy Cervix: Ectocervix and endocervix with no dysplasia or malignancy identified Endometrium: Endometrium with hemorrhage and weakly proliferative features; no hyperplasia, atypia, or malignancy identified Myometrium: Adenomyosis and benign leiomyomata measuring up to 1.1 cm Ovary, left: Hemorrhagic follicular cyst, small serous inclusions, and physiologic changes Fallopian tube, left: Paratubal cysts and no malignancy identified  D: Lymph nodes, right pelvic, lymphadenectomy - Nine lymph nodes with no metastatic carcinoma identified (0/9)  E: Lymph nodes, left pelvic, lymphadenectomy - Three lymph nodes with no metastatic carcinoma identified (0/3)  F: Peritoneum, left abdominal, biopsy - Fibroadipose tissue with no malignancy identified  G: Peritoneum, right pelvic, biopsy - Fibroadipose tissue with no malignancy identified  H: Peritoneum, right abdominal, biopsy - Fibroadipose tissue with no malignancy identified  I: Peritoneum, left pelvic, biopsy - Fibroadipose tissue with no malignancy identified  J: Peritoneum, posterior cul de sac, biopsy - Fibroadipose tissue with calcifications and no malignancy identified  K: Peritoneum, anterior cul de sac, biopsy - Fibroadipose tissue with no malignancy identified  L: Omentum, omentectomy - Adipose and fibrovascular tissue consistent with omentum  - No malignancy identified    02/08/2019 Surgery   Preoperative Diagnoses: Adnexal mass, Obesity BMI 40  Postoperative Diagnoses: Right adnexal mass, fallopian tube adenocarcinoma; Obesity BMI 40  Procedures: Diagnostic laparoscopy, Robotic-assisted total laparoscopic hysterectomy, bilateral salpingo-oophorectomy, bilateral pelvic lymphadenectomy, right common iliac lymphadenectomy, peritoneal biopsies,  minilaparotomy for infragastric ometectomy  Surgeon: Janie Morning, MD, PhD  Findings: Normal  upper abdominal survey: normal liver surface, diaphragm and stomach, normal appearing small and large bowel, normal omentum. No evidence of peritoneal disease or carcinomatosis. Small amount of ascites in the posterior cul-de-sac upon entry. Enlarged right adnexal mass (~8cm), appears to be arising from the fallopian tube with an otherwise normal appearing ovary. There was no intraoperative, intraperitoneal rupture of the mass; controlled removal of the mass occurred extraperitoneally in a bag. Normal appearing uterus and left adnexa. Normal appearing omentum. Patient with a short mesentery precluding adequate visualization of the aorta. Small nodules in the posterior cul-de-sac, biopsied; no additional peritoneal disease. R0 resection.    02/20/2019 Cancer Staging   Staging form: Ovary, Fallopian Tube, and Primary Peritoneal Carcinoma, AJCC 8th Edition - Pathologic: Stage II (pT2, pN0, cM0) - Signed by Heath Lark, MD on 02/20/2019      RISK FACTORS:  Menarche was at age 40.  First live birth at age 70.  OCP use for approximately 0 years.  Ovaries intact: no.  Hysterectomy: yes.  Menopausal status: premenopausal before surgery.  HRT use: 0 years. Colonoscopy: no. Mammogram within the last year: yes. Number of breast biopsies: 0.   Past Medical History:  Diagnosis Date  . Bacterial vaginitis   . Depression   . Family history of cervical cancer   . Family history of ovarian cancer   . Family history of throat cancer   . Hypercholesterolemia 05/17/2017    Past Surgical History:  Procedure Laterality Date  . DIAGNOSTIC LAPAROSCOPY    . DILATION AND CURETTAGE OF UTERUS    . GASTRIC BYPASS  2017  . HYSTEROSCOPY W/D&C Bilateral 06/07/2013   Procedure: DILATATION AND CURETTAGE /HYSTEROSCOPY with bilateral tubal cathetrization  ;  Surgeon: Governor Specking, MD;  Location: [facility];  Service:  Gynecology;  Laterality: Bilateral;  . LAPAROSCOPY Left 06/07/2013   Procedure: LAPAROSCOPY DIAGNOSTIC with aspiration of left peritubal cyst  ;  Surgeon: Governor Specking, MD;  Location: [facility];  Service: Gynecology;  Laterality: Left;  . LAPAROTOMY Right 06/07/2013   Procedure: LAPAROTOMY with bilateral corneal segmental salpingectomy right corneal anastamosis;  Surgeon: Governor Specking, MD;  Location: [facility];  Service: Gynecology;  Laterality: Right;    Social History   Socioeconomic History  . Marital status: Married    Spouse name: Izora Gala  . Number of children: 1  . Years of education: Not on file  . Highest education level: Not on file  Occupational History  . Occupation: receptionist  Social Needs  . Financial resource strain: Not on file  . Food insecurity    Worry: Not on file    Inability: Not on file  . Transportation needs    Medical: Not on file    Non-medical: Not on file  Tobacco Use  . Smoking status: Never Smoker  . Smokeless tobacco: Never Used  Substance and Sexual Activity  . Alcohol use: No  . Drug use: No  . Sexual activity: Yes    Birth control/protection: None  Lifestyle  . Physical activity    Days per week: Not on file    Minutes per session: Not on file  . Stress: Not on file  Relationships  . Social Herbalist on phone: Not on file    Gets together: Not on file    Attends religious service: Not on file    Active member of club or organization: Not on file    Attends meetings of clubs or organizations: Not on file  Relationship status: Not on file  Other Topics Concern  . Not on file  Social History Narrative  . Not on file     FAMILY HISTORY:  We obtained a detailed, 4-generation family history.  Significant diagnoses are listed below: Family History  Problem Relation Age of Onset  . Ovarian cancer Mother 31  . Diabetes Paternal Grandmother   . Cancer Father        unknown type - possibly skin  . HIV Father   .  Heart Problems Maternal Grandfather   . Cancer Paternal Grandfather        unknown type, diagnosed older than 2  . Cancer Maternal Uncle        unknown type, diagnosed older than 49  . Throat cancer Maternal Uncle        diagnosed older than 71s, smoker  . Cervical cancer Cousin        paternal cousin  . Breast cancer Neg Hx   . Colon cancer Neg Hx    Ms. Adolph has one son who is 82 and has not had cancer. She has one full brother who is 70, two maternal half-sisters who are 29 and 82, and two maternal half-brothers who are 25 and 11. One of her nieces (half-sister's daughter) has had an abnormal pap smear, but there are no known diagnoses of cancer among any of these relatives.   Ms. Privett mother was diagnosed with ovarian cancer at age 89, and died at age 34. She has one maternal aunt, who is in her 31s, and nine maternal uncles. 12 of her uncles have died, all over the age of 14, and one or two of these individuals died from an unknown type of cancer. One of her living maternal uncles is in his 74s and has not had cancer, and the other is in his 81s and has had throat cancer that was diagnosed when he was older than his 27s. Of note, this uncle also has a history of smoking. There are no known diagnoses of cancer among maternal cousins. Ms. Price maternal grandmother died in her 9s or 84s, and her grandfather died in his 72s from heart problems.  Ms. Strong father died in his 64s and had many health problems, including HIV and an unknown type of cancer. Ms. Hakes believes this may have been a skin cancer, but she is not sure. She has three paternal aunts who are in their 59s and early 50s and have not had cancer. One of her paternal first cousins has had cervical cancer that required radiation. Her paternal grandmother is currently living and is in her 47s, and her paternal grandfather died at an unknown age (but older than 79) and had an unknown type of cancer.  Ms. Kram is  unaware of previous family history of genetic testing for hereditary cancer risks. Her maternal and paternal ancestors are both from [place]. There is no reported Ashkenazi Jewish ancestry. There is no known consanguinity.  GENETIC COUNSELING ASSESSMENT: Ms. Lascala is a 43 y.o. female with a personal and family history of ovarian cancer, which is somewhat suggestive of a hereditary cancer syndrome and predisposition to cancer. We, therefore, discussed and recommended the following at today's visit.   DISCUSSION: We discussed that 15 - 20% of ovarian cancer is hereditary, with most cases associated with BRCA1/2.  There are other genes that can be associated with hereditary ovarian cancer syndromes.  These include BRIP1, RAD51C, RAD51D, and the Lynch syndrome genes.  We discussed that testing is beneficial for several reasons including identifying whether potential treatment options such as PARP inhibitors would be beneficial, knowing about other cancer risks, identifying potential screening and risk-reduction options that may be appropriate, and to understand if other family members could be at risk for cancer and allow them to undergo genetic testing.   We reviewed the characteristics, features and inheritance patterns of hereditary cancer syndromes. We also discussed genetic testing, including the appropriate family members to test, the process of testing, insurance coverage and turn-around-time for results. We discussed the implications of a negative, positive and/or variant of uncertain significant result.   We also reviewed tumor testing for homologous recombination deficiency (HRD).  We discussed that genetic testing on her blood will test for hereditary genetic changes that could explain her diagnosis of cancer.  HRD testing is a genetic test performed on the tumor that can determine somatic genetic changes that could influence her management.  HRD testing is performed in tandem with genetic  testing, and typically at no additional cost.   We recommended Ms. Sedlacek pursue genetic testing for the [facility] TumorNext-HRD+CancerNext gene panel. The TumorNext-HRD+CancerNext test offered by [facility] includes paired germline and tumor analyses of 11 genes associated with homologous recombination repair (ATM, BARD1, BRIP1, CHEK2, MRE11A, NBN, PALB2, RAD51C, RAD51D, BRCA1, BRCA2) plus germline analyses of 26 additional genes associated with hereditary cancer (APC, AXIN2, BMPR1A, CDH1, CDK4, CDKN2A, DICER1, HOXB13, EPCAM, GREM1, MLH1, MSH2, MSH3, MSH6, MUTYH, NF1, NTHL1, PMS2, POLD1, POLE, PTEN, RECQL, SMAD4, SMARCA4, STK11, and TP53).   Based on Ms. Imparato's personal and family history of cancer, she meets medical criteria for genetic testing. Despite that she meets criteria, she may still have an out of pocket cost. We discussed that if her out of pocket cost for testing is over $100, the laboratory will call and confirm whether she wants to proceed with testing.  If the out of pocket cost of testing is less than $100 she will be billed by the genetic testing laboratory.   PLAN: After considering the risks, benefits, and limitations, Ms. Jagoda provided informed consent to pursue genetic testing and the blood sample and tumor sample was sent to [facility] for analysis of the TumorNext-HRD+CancerNext gene panel. Results should be available within approximately three-four weeks' time, at which point they will be disclosed by telephone to Ms. Darrow, as will any additional recommendations warranted by these results. Ms. Gieske will receive a summary of her genetic counseling visit and a copy of her results once available. This information will also be available in Epic.   Ms. Wint's questions were answered to her satisfaction today. Our contact information was provided should additional questions or concerns arise. Thank you for the referral and allowing [place] to share in the care of your patient.    Clint Guy, MS, [facility] Certified Genetic Counselor [email or web address] Phone: [phone]  The patient was seen for a total of 30 minutes in face-to-face genetic counseling.  This patient was discussed with Drs. Magrinat, Lindi Adie and/or Burr Medico who agrees with the above.    _______________________________________________________________________ For Office Staff:  Number of people involved in session: 1 Was an Intern/ student involved with case: no

## 2019-03-01 LAB — CA 125: Cancer Antigen (CA) 125: 26.8 U/mL (ref 0.0–38.1)

## 2019-03-02 ENCOUNTER — Other Ambulatory Visit: Payer: Self-pay | Admitting: Radiology

## 2019-03-02 ENCOUNTER — Other Ambulatory Visit: Payer: Self-pay | Admitting: Student

## 2019-03-03 ENCOUNTER — Encounter: Payer: Self-pay | Admitting: Hematology and Oncology

## 2019-03-03 ENCOUNTER — Other Ambulatory Visit: Payer: Self-pay

## 2019-03-03 ENCOUNTER — Encounter (HOSPITAL_COMMUNITY): Payer: Self-pay

## 2019-03-03 ENCOUNTER — Ambulatory Visit (HOSPITAL_COMMUNITY)
Admission: RE | Admit: 2019-03-03 | Discharge: 2019-03-03 | Disposition: A | Discharge status: Home / Self Care | Payer: Managed Care, Other (non HMO) | Source: Ambulatory Visit | Attending: Hematology and Oncology | Admitting: Hematology and Oncology

## 2019-03-03 DIAGNOSIS — C482 Malignant neoplasm of peritoneum, unspecified: Secondary | ICD-10-CM | POA: Insufficient documentation

## 2019-03-03 DIAGNOSIS — Z9884 Bariatric surgery status: Secondary | ICD-10-CM | POA: Insufficient documentation

## 2019-03-03 DIAGNOSIS — Z90722 Acquired absence of ovaries, bilateral: Secondary | ICD-10-CM | POA: Insufficient documentation

## 2019-03-03 DIAGNOSIS — Z90710 Acquired absence of both cervix and uterus: Secondary | ICD-10-CM | POA: Diagnosis not present

## 2019-03-03 DIAGNOSIS — Z809 Family history of malignant neoplasm, unspecified: Secondary | ICD-10-CM | POA: Diagnosis not present

## 2019-03-03 DIAGNOSIS — Z8041 Family history of malignant neoplasm of ovary: Secondary | ICD-10-CM | POA: Diagnosis not present

## 2019-03-03 DIAGNOSIS — Z8049 Family history of malignant neoplasm of other genital organs: Secondary | ICD-10-CM | POA: Diagnosis not present

## 2019-03-03 DIAGNOSIS — E7800 Pure hypercholesterolemia, unspecified: Secondary | ICD-10-CM | POA: Diagnosis not present

## 2019-03-03 HISTORY — PX: IR IMAGING GUIDED PORT INSERTION: IMG5740

## 2019-03-03 LAB — PROTIME-INR
INR: 0.9 (ref 0.8–1.2)
Prothrombin Time: 11.9 seconds (ref 11.4–15.2)

## 2019-03-03 LAB — CBC
HCT: 40.8 % (ref 36.0–46.0)
Hemoglobin: 12.8 g/dL (ref 12.0–15.0)
MCH: 27.7 pg (ref 26.0–34.0)
MCHC: 31.4 g/dL (ref 30.0–36.0)
MCV: 88.3 fL (ref 80.0–100.0)
Platelets: 310 10*3/uL (ref 150–400)
RBC: 4.62 MIL/uL (ref 3.87–5.11)
RDW: 11.5 % (ref 11.5–15.5)
WBC: 6.9 10*3/uL (ref 4.0–10.5)
nRBC: 0 % (ref 0.0–0.2)

## 2019-03-03 MED ORDER — CEFAZOLIN SODIUM-DEXTROSE 2-4 GM/100ML-% IV SOLN
INTRAVENOUS | Status: AC
  Administered 2019-03-03: 15:00:00 2 g via INTRAVENOUS
  Filled 2019-03-03: qty 100

## 2019-03-03 MED ORDER — HEPARIN SOD (PORK) LOCK FLUSH 100 UNIT/ML IV SOLN
INTRAVENOUS | Status: AC | PRN
  Administered 2019-03-03: 500 [IU] via INTRAVENOUS

## 2019-03-03 MED ORDER — FENTANYL CITRATE (PF) 100 MCG/2ML IJ SOLN
INTRAMUSCULAR | Status: AC | PRN
  Administered 2019-03-03 (×2): 50 ug via INTRAVENOUS

## 2019-03-03 MED ORDER — LIDOCAINE-EPINEPHRINE 1 %-1:100000 IJ SOLN
INTRAMUSCULAR | Status: AC
  Filled 2019-03-03: qty 1

## 2019-03-03 MED ORDER — CEFAZOLIN SODIUM-DEXTROSE 2-4 GM/100ML-% IV SOLN
2.0000 g | Freq: Once | INTRAVENOUS | Status: AC
  Administered 2019-03-03: 15:00:00 2 g via INTRAVENOUS

## 2019-03-03 MED ORDER — HEPARIN SOD (PORK) LOCK FLUSH 100 UNIT/ML IV SOLN
INTRAVENOUS | Status: AC
  Filled 2019-03-03: qty 5

## 2019-03-03 MED ORDER — SODIUM CHLORIDE 0.9 % IV SOLN
INTRAVENOUS | Status: DC
  Administered 2019-03-03: 13:00:00 via INTRAVENOUS

## 2019-03-03 MED ORDER — LIDOCAINE HCL (PF) 1 % IJ SOLN
INTRAMUSCULAR | Status: AC | PRN
  Administered 2019-03-03: 5 mL

## 2019-03-03 MED ORDER — MIDAZOLAM HCL 2 MG/2ML IJ SOLN
INTRAMUSCULAR | Status: AC
  Filled 2019-03-03: qty 4

## 2019-03-03 MED ORDER — FENTANYL CITRATE (PF) 100 MCG/2ML IJ SOLN
INTRAMUSCULAR | Status: AC
  Filled 2019-03-03: qty 2

## 2019-03-03 MED ORDER — MIDAZOLAM HCL 2 MG/2ML IJ SOLN
INTRAMUSCULAR | Status: AC | PRN
  Administered 2019-03-03 (×2): 1 mg via INTRAVENOUS

## 2019-03-03 MED ORDER — LIDOCAINE-EPINEPHRINE (PF) 1 %-1:200000 IJ SOLN
INTRAMUSCULAR | Status: AC | PRN
  Administered 2019-03-03: 10 mL

## 2019-03-03 NOTE — Consult Note (Signed)
Chief Complaint: Patient was seen in consultation today for port a cath placement  Referring Physician(s): [place]  Supervising Physician: Jacqulynn Cadet  Patient Status: Caitlin [name] - Out-pt  History of Present Illness: Caitlin [name] is a 43 y.o. female with history of newly diagnosed primary peritoneal adenocarcinoma, status post hysterectomy/BSO/pelvic lymphadenectomy/right common iliac lymphadenectomy, peritoneal biopsies and mini laparotomy for infra-gastric omentectomy on 02/08/2019.  She presents today for Port-A-Cath placement for chemotherapy.  Past Medical History:  Diagnosis Date  . Bacterial vaginitis   . Depression   . Family history of cervical cancer   . Family history of ovarian cancer   . Family history of throat cancer   . Hypercholesterolemia 05/17/2017    Past Surgical History:  Procedure Laterality Date  . DIAGNOSTIC LAPAROSCOPY    . DILATION AND CURETTAGE OF UTERUS    . GASTRIC BYPASS  2017  . HYSTEROSCOPY W/D&C Bilateral 06/07/2013   Procedure: DILATATION AND CURETTAGE /HYSTEROSCOPY with bilateral tubal cathetrization  ;  Surgeon: Governor Specking, MD;  Location: [facility];  Service: Gynecology;  Laterality: Bilateral;  . LAPAROSCOPY Left 06/07/2013   Procedure: LAPAROSCOPY DIAGNOSTIC with aspiration of left peritubal cyst  ;  Surgeon: Governor Specking, MD;  Location: [facility];  Service: Gynecology;  Laterality: Left;  . LAPAROTOMY Right 06/07/2013   Procedure: LAPAROTOMY with bilateral corneal segmental salpingectomy right corneal anastamosis;  Surgeon: Governor Specking, MD;  Location: [facility];  Service: Gynecology;  Laterality: Right;    Allergies: Patient has no known allergies.  Medications: Prior to Admission medications   Medication Sig Start Date End Date Taking? Authorizing Provider  dexamethasone (DECADRON) 4 MG tablet Take 2 tabs at the night before and 2 tab the morning of chemotherapy, every 3 weeks, by mouth 02/23/19   Heath Lark, MD   lidocaine-prilocaine (EMLA) cream Apply to affected area once 02/23/19   Heath Lark, MD  ondansetron (ZOFRAN) 8 MG tablet Take 1 tablet (8 mg total) by mouth every 8 (eight) hours as needed for refractory nausea / vomiting. Start on day 3 after carboplatin chemo. 02/23/19   Heath Lark, MD  prochlorperazine (COMPAZINE) 10 MG tablet Take 1 tablet (10 mg total) by mouth every 6 (six) hours as needed (Nausea or vomiting). 02/23/19   Heath Lark, MD     Family History  Problem Relation Age of Onset  . Ovarian cancer Mother 90  . Diabetes Paternal Grandmother   . Cancer Father        unknown type - possibly skin  . HIV Father   . Heart Problems Maternal Grandfather   . Cancer Paternal Grandfather        unknown type, diagnosed older than 46  . Cancer Maternal Uncle        unknown type, diagnosed older than 35  . Throat cancer Maternal Uncle        diagnosed older than 54s, smoker  . Cervical cancer Cousin        paternal cousin  . Breast cancer Neg Hx   . Colon cancer Neg Hx     Social History   Socioeconomic History  . Marital status: Married    Spouse name: Izora Gala  . Number of children: 1  . Years of education: Not on file  . Highest education level: Not on file  Occupational History  . Occupation: receptionist  Social Needs  . Financial resource strain: Not on file  . Food insecurity    Worry: Not on file    Inability: Not on  file  . Transportation needs    Medical: Not on file    Non-medical: Not on file  Tobacco Use  . Smoking status: Never Smoker  . Smokeless tobacco: Never Used  Substance and Sexual Activity  . Alcohol use: No  . Drug use: No  . Sexual activity: Yes    Birth control/protection: None  Lifestyle  . Physical activity    Days per week: Not on file    Minutes per session: Not on file  . Stress: Not on file  Relationships  . Social Herbalist on phone: Not on file    Gets together: Not on file    Attends religious service: Not on  file    Active member of club or organization: Not on file    Attends meetings of clubs or organizations: Not on file    Relationship status: Not on file  Other Topics Concern  . Not on file  Social History Narrative  . Not on file      Review of Systems currently denies fever, headache, chest pain, dyspnea, cough, back pain, nausea, vomiting or bleeding.  She does have some mild abdominal discomfort  Vital Signs: Blood pressure 169/99, heart rate 66, temp 98.2, respirations 15, O2 sat 100% room air LMP 01/23/2019 (Within Weeks)   Physical Exam awake, alert.  Chest clear to auscultation bilaterally.  Heart with regular rate and rhythm.  Abdomen obese, soft, positive bowel sounds, mild generalized tenderness to palpation.  Extremities with full range of motion.  Imaging: No results found.  Labs:  CBC: Recent Labs    02/28/19 0951 03/03/19 1235  WBC 6.2 6.9  HGB 12.4 12.8  HCT 39.2 40.8  PLT 317 310    COAGS: No results for input(s): INR, APTT in the last 8760 hours.  BMP: Recent Labs    02/28/19 0951  NA 143  K 4.2  CL 105  CO2 29  GLUCOSE 97  BUN 15  CALCIUM 10.0  CREATININE 0.72  GFRNONAA >60  GFRAA >60    LIVER FUNCTION TESTS: Recent Labs    02/28/19 0951  BILITOT 0.5  AST 21  ALT 20  ALKPHOS 70  PROT 7.6  ALBUMIN 3.9    TUMOR MARKERS: No results for input(s): AFPTM, CEA, CA199, CHROMGRNA in the last 8760 hours.  Assessment and Plan: 43 y.o. female with history of newly diagnosed primary peritoneal adenocarcinoma, status post hysterectomy/BSO/pelvic lymphadenectomy/right common iliac lymphadenectomy, peritoneal biopsies and mini laparotomy for infra-gastric omentectomy on 02/08/2019.  She presents today for Port-A-Cath placement for chemotherapy.Risks and benefits of image guided port-a-catheter placement was discussed with the patient including, but not limited to bleeding, infection, pneumothorax, or fibrin sheath development and need for  additional procedures.  All of the patient's questions were answered, patient is agreeable to proceed. Consent signed and in chart.      Thank you for this interesting consult.  I greatly enjoyed meeting Lynita Buetow and look forward to participating in their care.  A copy of this report was sent to the requesting provider on this date.  Electronically Signed: D. Rowe Robert, PA-C 03/03/2019, 12:58 PM   I spent a total of 25 minutes    in face to face in clinical consultation, greater than 50% of which was counseling/coordinating care for port a cath placement

## 2019-03-03 NOTE — Progress Notes (Signed)
Patient's spouse called regarding available possible assistance.  Advised him I will be meeting with his spouse in person at [facility]/19 to discuss with her, however discussed available copay assistance and what is needed to apply as well as the one-time $1000 [employer] and qualifications to assist with personal expenses while going through treatment. Advised he may discuss with spouse and be prepared to provide documentation. He verbalized understanding.  She will be given my card at appointment as well.

## 2019-03-03 NOTE — Procedures (Signed)
Interventional Radiology Procedure Note  Procedure: Placement of a right IJ approach single lumen PowerPort.  Tip is positioned at the superior cavoatrial junction and catheter is ready for immediate use.  Complications: No immediate Recommendations:  - Ok to shower tomorrow - Do not submerge for 7 days - Routine line care   Signed,  Leslye Puccini K. Clint Biello, MD   

## 2019-03-03 NOTE — Progress Notes (Signed)
Pt updated that IR is running behind, about 30-40 minutes.  Pt voiced understanding.

## 2019-03-03 NOTE — Discharge Instructions (Signed)
Implanted Port Insertion, Care After °This sheet gives you information about how to care for yourself after your procedure. Your health care provider may also give you more specific instructions. If you have problems or questions, contact your health care provider. °What can I expect after the procedure? °After the procedure, it is common to have: °· Discomfort at the port insertion site. °· Bruising on the skin over the port. This should improve over 3-4 days. °Follow these instructions at home: °Port care °· After your port is placed, you will get a manufacturer's information card. The card has information about your port. Keep this card with you at all times. °· Take care of the port as told by your health care provider. Ask your health care provider if you or a family member can get training for taking care of the port at home. A home health care nurse may also take care of the port. °· Make sure to remember what type of port you have. °Incision care ° °  ° °· Follow instructions from your health care provider about how to take care of your port insertion site. Make sure you: °? Wash your hands with soap and water before and after you change your bandage (dressing). If soap and water are not available, use hand sanitizer. °? Change your dressing as told by your health care provider. °? Leave stitches (sutures), skin glue, or adhesive strips in place. These skin closures may need to stay in place for 2 weeks or longer. If adhesive strip edges start to loosen and curl up, you may trim the loose edges. Do not remove adhesive strips completely unless your health care provider tells you to do that. °· Check your port insertion site every day for signs of infection. Check for: °? Redness, swelling, or pain. °? Fluid or blood. °? Warmth. °? Pus or a bad smell. °Activity °· Return to your normal activities as told by your health care provider. Ask your health care provider what activities are safe for you. °· Do not  lift anything that is heavier than 10 lb (4.5 kg), or the limit that you are told, until your health care provider says that it is safe. °General instructions °· Take over-the-counter and prescription medicines only as told by your health care provider. °· Do not take baths, swim, or use a hot tub until your health care provider approves. Ask your health care provider if you may take showers. You may only be allowed to take sponge baths. °· Do not drive for 24 hours if you were given a sedative during your procedure. °· Wear a medical alert bracelet in case of an emergency. This will tell any health care providers that you have a port. °· Keep all follow-up visits as told by your health care provider. This is important. °Contact a health care provider if: °· You cannot flush your port with saline as directed, or you cannot draw blood from the port. °· You have a fever or chills. °· You have redness, swelling, or pain around your port insertion site. °· You have fluid or blood coming from your port insertion site. °· Your port insertion site feels warm to the touch. °· You have pus or a bad smell coming from the port insertion site. °Get help right away if: °· You have chest pain or shortness of breath. °· You have bleeding from your port that you cannot control. °Summary °· Take care of the port as told by your health   care provider. Keep the manufacturer's information card with you at all times. °· Change your dressing as told by your health care provider. °· Contact a health care provider if you have a fever or chills or if you have redness, swelling, or pain around your port insertion site. °· Keep all follow-up visits as told by your health care provider. °This information is not intended to replace advice given to you by your health care provider. Make sure you discuss any questions you have with your health care provider. °Document Released: 02/22/2013 Document Revised: 11/30/2017 Document Reviewed:  11/30/2017 °Elsevier Patient Education © 2020 Elsevier Inc. ° °Moderate Conscious Sedation, Adult, Care After °These instructions provide you with information about caring for yourself after your procedure. Your health care provider may also give you more specific instructions. Your treatment has been planned according to current medical practices, but problems sometimes occur. Call your health care provider if you have any problems or questions after your procedure. °What can I expect after the procedure? °After your procedure, it is common: °· To feel sleepy for several hours. °· To feel clumsy and have poor balance for several hours. °· To have poor judgment for several hours. °· To vomit if you eat too soon. °Follow these instructions at home: °For at least 24 hours after the procedure: ° °· Do not: °? Participate in activities where you could fall or become injured. °? Drive. °? Use heavy machinery. °? Drink alcohol. °? Take sleeping pills or medicines that cause drowsiness. °? Make important decisions or sign legal documents. °? Take care of children on your own. °· Rest. °Eating and drinking °· Follow the diet recommended by your health care provider. °· If you vomit: °? Drink water, juice, or soup when you can drink without vomiting. °? Make sure you have little or no nausea before eating solid foods. °General instructions °· Have a responsible adult stay with you until you are awake and alert. °· Take over-the-counter and prescription medicines only as told by your health care provider. °· If you smoke, do not smoke without supervision. °· Keep all follow-up visits as told by your health care provider. This is important. °Contact a health care provider if: °· You keep feeling nauseous or you keep vomiting. °· You feel light-headed. °· You develop a rash. °· You have a fever. °Get help right away if: °· You have trouble breathing. °This information is not intended to replace advice given to you by your  health care provider. Make sure you discuss any questions you have with your health care provider. °Document Released: 02/22/2013 Document Revised: 04/16/2017 Document Reviewed: 08/24/2015 °Elsevier Patient Education © 2020 Elsevier Inc. ° °

## 2019-03-03 NOTE — Progress Notes (Signed)
Called Terrance Wimberly(pt's spouse) to confirm ride. [phone].  No answer.  Electronic message states voicemail has not been set up.  Will try again later.

## 2019-03-03 NOTE — Progress Notes (Signed)
Spoke with [name], pt's spouse.  Ride confirmed.

## 2019-03-06 ENCOUNTER — Other Ambulatory Visit: Payer: Self-pay

## 2019-03-06 ENCOUNTER — Encounter: Payer: Self-pay | Admitting: Oncology

## 2019-03-06 ENCOUNTER — Inpatient Hospital Stay: Payer: Managed Care, Other (non HMO)

## 2019-03-06 VITALS — BP 108/72 | HR 93 | Temp 99.2°F | Resp 18 | Wt 216.5 lb

## 2019-03-06 DIAGNOSIS — Z5111 Encounter for antineoplastic chemotherapy: Secondary | ICD-10-CM | POA: Diagnosis not present

## 2019-03-06 DIAGNOSIS — C482 Malignant neoplasm of peritoneum, unspecified: Secondary | ICD-10-CM

## 2019-03-06 MED ORDER — HEPARIN SOD (PORK) LOCK FLUSH 100 UNIT/ML IV SOLN
500.0000 [IU] | Freq: Once | INTRAVENOUS | Status: AC | PRN
  Administered 2019-03-06: 16:00:00 500 [IU]
  Filled 2019-03-06: qty 5

## 2019-03-06 MED ORDER — DIPHENHYDRAMINE HCL 50 MG/ML IJ SOLN
50.0000 mg | Freq: Once | INTRAMUSCULAR | Status: AC
  Administered 2019-03-06: 50 mg via INTRAVENOUS

## 2019-03-06 MED ORDER — SODIUM CHLORIDE 0.9% FLUSH
10.0000 mL | INTRAVENOUS | Status: DC | PRN
  Administered 2019-03-06: 16:00:00 10 mL
  Filled 2019-03-06: qty 10

## 2019-03-06 MED ORDER — SODIUM CHLORIDE 0.9 % IV SOLN
175.0000 mg/m2 | Freq: Once | INTRAVENOUS | Status: AC
  Administered 2019-03-06: 11:00:00 366 mg via INTRAVENOUS
  Filled 2019-03-06: qty 61

## 2019-03-06 MED ORDER — PALONOSETRON HCL INJECTION 0.25 MG/5ML
0.2500 mg | Freq: Once | INTRAVENOUS | Status: AC
  Administered 2019-03-06: 0.25 mg via INTRAVENOUS

## 2019-03-06 MED ORDER — PALONOSETRON HCL INJECTION 0.25 MG/5ML
INTRAVENOUS | Status: AC
  Filled 2019-03-06: qty 5

## 2019-03-06 MED ORDER — SODIUM CHLORIDE 0.9 % IV SOLN
Freq: Once | INTRAVENOUS | Status: AC
  Administered 2019-03-06: 10:00:00 via INTRAVENOUS
  Filled 2019-03-06: qty 5

## 2019-03-06 MED ORDER — SODIUM CHLORIDE 0.9 % IV SOLN
Freq: Once | INTRAVENOUS | Status: AC
  Administered 2019-03-06: 10:00:00 via INTRAVENOUS
  Filled 2019-03-06: qty 250

## 2019-03-06 MED ORDER — FAMOTIDINE IN NACL 20-0.9 MG/50ML-% IV SOLN
20.0000 mg | Freq: Once | INTRAVENOUS | Status: AC
  Administered 2019-03-06: 10:00:00 20 mg via INTRAVENOUS

## 2019-03-06 MED ORDER — DIPHENHYDRAMINE HCL 50 MG/ML IJ SOLN
INTRAMUSCULAR | Status: AC
  Filled 2019-03-06: qty 1

## 2019-03-06 MED ORDER — SODIUM CHLORIDE 0.9 % IV SOLN
750.0000 mg | Freq: Once | INTRAVENOUS | Status: AC
  Administered 2019-03-06: 15:00:00 750 mg via INTRAVENOUS
  Filled 2019-03-06: qty 75

## 2019-03-06 MED ORDER — FAMOTIDINE IN NACL 20-0.9 MG/50ML-% IV SOLN
INTRAVENOUS | Status: AC
  Filled 2019-03-06: qty 50

## 2019-03-06 NOTE — Patient Instructions (Signed)
[facility] Discharge Instructions for Patients Receiving Chemotherapy  Today you received the following chemotherapy agents Paclitaxel (TAXOL) & Carboplatin (PARAPLATIN).  To help prevent nausea and vomiting after your treatment, we encourage you to take your nausea medication as prescribed.  If you develop nausea and vomiting that is not controlled by your nausea medication, call the clinic.   BELOW ARE SYMPTOMS THAT SHOULD BE REPORTED IMMEDIATELY:  *FEVER GREATER THAN 100.5 F  *CHILLS WITH OR WITHOUT FEVER  NAUSEA AND VOMITING THAT IS NOT CONTROLLED WITH YOUR NAUSEA MEDICATION  *UNUSUAL SHORTNESS OF BREATH  *UNUSUAL BRUISING OR BLEEDING  TENDERNESS IN MOUTH AND THROAT WITH OR WITHOUT PRESENCE OF ULCERS  *URINARY PROBLEMS  *BOWEL PROBLEMS  UNUSUAL RASH Items with * indicate a potential emergency and should be followed up as soon as possible.  Feel free to call the clinic should you have any questions or concerns. The clinic phone number is [phone].  Please show the CHEMO ALERT CARD at check-in to the Emergency Department and triage nurse.  Paclitaxel injection What is this medicine? PACLITAXEL (PAK li TAX el) is a chemotherapy drug. It targets fast dividing cells, like cancer cells, and causes these cells to die. This medicine is used to treat ovarian cancer, breast cancer, lung cancer, Kaposi's sarcoma, and other cancers. This medicine may be used for other purposes; ask your health care provider or pharmacist if you have questions. COMMON BRAND NAME(S): Onxol, Taxol What should I tell my health care provider before I take this medicine? They need to know if you have any of these conditions:  history of irregular heartbeat  liver disease  low blood counts, like low white cell, platelet, or red cell counts  lung or breathing disease, like asthma  tingling of the fingers or toes, or other nerve disorder  an unusual or allergic reaction to  paclitaxel, alcohol, polyoxyethylated castor oil, other chemotherapy, other medicines, foods, dyes, or preservatives  pregnant or trying to get pregnant  breast-feeding How should I use this medicine? This drug is given as an infusion into a vein. It is administered in a hospital or clinic by a specially trained health care professional. Talk to your pediatrician regarding the use of this medicine in children. Special care may be needed. Overdosage: If you think you have taken too much of this medicine contact a poison control center or emergency room at once. NOTE: This medicine is only for you. Do not share this medicine with others. What if I miss a dose? It is important not to miss your dose. Call your doctor or health care professional if you are unable to keep an appointment. What may interact with this medicine? Do not take this medicine with any of the following medications:  disulfiram  metronidazole This medicine may also interact with the following medications:  antiviral medicines for hepatitis, HIV or AIDS  certain antibiotics like erythromycin and clarithromycin  certain medicines for fungal infections like ketoconazole and itraconazole  certain medicines for seizures like carbamazepine, phenobarbital, phenytoin  gemfibrozil  nefazodone  rifampin  St. John's wort This list may not describe all possible interactions. Give your health care provider a list of all the medicines, herbs, non-prescription drugs, or dietary supplements you use. Also tell them if you smoke, drink alcohol, or use illegal drugs. Some items may interact with your medicine. What should I watch for while using this medicine? Your condition will be monitored carefully while you are receiving this medicine. You will need   important blood work done while you are taking this medicine. This medicine can cause serious allergic reactions. To reduce your risk you will need to take other medicine(s)  before treatment with this medicine. If you experience allergic reactions like skin rash, itching or hives, swelling of the face, lips, or tongue, tell your doctor or health care professional right away. In some cases, you may be given additional medicines to help with side effects. Follow all directions for their use. This drug may make you feel generally unwell. This is not uncommon, as chemotherapy can affect healthy cells as well as cancer cells. Report any side effects. Continue your course of treatment even though you feel ill unless your doctor tells you to stop. Call your doctor or health care professional for advice if you get a fever, chills or sore throat, or other symptoms of a cold or flu. Do not treat yourself. This drug decreases your body's ability to fight infections. Try to avoid being around people who are sick. This medicine may increase your risk to bruise or bleed. Call your doctor or health care professional if you notice any unusual bleeding. Be careful brushing and flossing your teeth or using a toothpick because you may get an infection or bleed more easily. If you have any dental work done, tell your dentist you are receiving this medicine. Avoid taking products that contain aspirin, acetaminophen, ibuprofen, naproxen, or ketoprofen unless instructed by your doctor. These medicines may hide a fever. Do not become pregnant while taking this medicine. Women should inform their doctor if they wish to become pregnant or think they might be pregnant. There is a potential for serious side effects to an unborn child. Talk to your health care professional or pharmacist for more information. Do not breast-feed an infant while taking this medicine. Men are advised not to father a child while receiving this medicine. This product may contain alcohol. Ask your pharmacist or healthcare provider if this medicine contains alcohol. Be sure to tell all healthcare providers you are taking this  medicine. Certain medicines, like metronidazole and disulfiram, can cause an unpleasant reaction when taken with alcohol. The reaction includes flushing, headache, nausea, vomiting, sweating, and increased thirst. The reaction can last from 30 minutes to several hours. What side effects may I notice from receiving this medicine? Side effects that you should report to your doctor or health care professional as soon as possible:  allergic reactions like skin rash, itching or hives, swelling of the face, lips, or tongue  breathing problems  changes in vision  fast, irregular heartbeat  high or low blood pressure  mouth sores  pain, tingling, numbness in the hands or feet  signs of decreased platelets or bleeding - bruising, pinpoint red spots on the skin, black, tarry stools, blood in the urine  signs of decreased red blood cells - unusually weak or tired, feeling faint or lightheaded, falls  signs of infection - fever or chills, cough, sore throat, pain or difficulty passing urine  signs and symptoms of liver injury like dark yellow or brown urine; general ill feeling or flu-like symptoms; light-colored stools; loss of appetite; nausea; right upper belly pain; unusually weak or tired; yellowing of the eyes or skin  swelling of the ankles, feet, hands  unusually slow heartbeat Side effects that usually do not require medical attention (report to your doctor or health care professional if they continue or are bothersome):  diarrhea  hair loss  loss of appetite  muscle or   joint pain  nausea, vomiting  pain, redness, or irritation at site where injected  tiredness This list may not describe all possible side effects. Call your doctor for medical advice about side effects. You may report side effects to FDA at 1-800-FDA-1088. Where should I keep my medicine? This drug is given in a hospital or clinic and will not be stored at home. NOTE: This sheet is a summary. It may not  cover all possible information. If you have questions about this medicine, talk to your doctor, pharmacist, or health care provider.  2020 Elsevier/Gold Standard (2017-01-05 13:14:55)  Carboplatin injection What is this medicine? CARBOPLATIN (KAR boe pla tin) is a chemotherapy drug. It targets fast dividing cells, like cancer cells, and causes these cells to die. This medicine is used to treat ovarian cancer and many other cancers. This medicine may be used for other purposes; ask your health care provider or pharmacist if you have questions. COMMON BRAND NAME(S): Paraplatin What should I tell my health care provider before I take this medicine? They need to know if you have any of these conditions:  blood disorders  hearing problems  kidney disease  recent or ongoing radiation therapy  an unusual or allergic reaction to carboplatin, cisplatin, other chemotherapy, other medicines, foods, dyes, or preservatives  pregnant or trying to get pregnant  breast-feeding How should I use this medicine? This drug is usually given as an infusion into a vein. It is administered in a hospital or clinic by a specially trained health care professional. Talk to your pediatrician regarding the use of this medicine in children. Special care may be needed. Overdosage: If you think you have taken too much of this medicine contact a poison control center or emergency room at once. NOTE: This medicine is only for you. Do not share this medicine with others. What if I miss a dose? It is important not to miss a dose. Call your doctor or health care professional if you are unable to keep an appointment. What may interact with this medicine?  medicines for seizures  medicines to increase blood counts like filgrastim, pegfilgrastim, sargramostim  some antibiotics like amikacin, gentamicin, neomycin, streptomycin, tobramycin  vaccines Talk to your doctor or health care professional before taking any of  these medicines:  acetaminophen  aspirin  ibuprofen  ketoprofen  naproxen This list may not describe all possible interactions. Give your health care provider a list of all the medicines, herbs, non-prescription drugs, or dietary supplements you use. Also tell them if you smoke, drink alcohol, or use illegal drugs. Some items may interact with your medicine. What should I watch for while using this medicine? Your condition will be monitored carefully while you are receiving this medicine. You will need important blood work done while you are taking this medicine. This drug may make you feel generally unwell. This is not uncommon, as chemotherapy can affect healthy cells as well as cancer cells. Report any side effects. Continue your course of treatment even though you feel ill unless your doctor tells you to stop. In some cases, you may be given additional medicines to help with side effects. Follow all directions for their use. Call your doctor or health care professional for advice if you get a fever, chills or sore throat, or other symptoms of a cold or flu. Do not treat yourself. This drug decreases your body's ability to fight infections. Try to avoid being around people who are sick. This medicine may increase your risk to   bruise or bleed. Call your doctor or health care professional if you notice any unusual bleeding. Be careful brushing and flossing your teeth or using a toothpick because you may get an infection or bleed more easily. If you have any dental work done, tell your dentist you are receiving this medicine. Avoid taking products that contain aspirin, acetaminophen, ibuprofen, naproxen, or ketoprofen unless instructed by your doctor. These medicines may hide a fever. Do not become pregnant while taking this medicine. Women should inform their doctor if they wish to become pregnant or think they might be pregnant. There is a potential for serious side effects to an unborn child.  Talk to your health care professional or pharmacist for more information. Do not breast-feed an infant while taking this medicine. What side effects may I notice from receiving this medicine? Side effects that you should report to your doctor or health care professional as soon as possible:  allergic reactions like skin rash, itching or hives, swelling of the face, lips, or tongue  signs of infection - fever or chills, cough, sore throat, pain or difficulty passing urine  signs of decreased platelets or bleeding - bruising, pinpoint red spots on the skin, black, tarry stools, nosebleeds  signs of decreased red blood cells - unusually weak or tired, fainting spells, lightheadedness  breathing problems  changes in hearing  changes in vision  chest pain  high blood pressure  low blood counts - This drug may decrease the number of white blood cells, red blood cells and platelets. You may be at increased risk for infections and bleeding.  nausea and vomiting  pain, swelling, redness or irritation at the injection site  pain, tingling, numbness in the hands or feet  problems with balance, talking, walking  trouble passing urine or change in the amount of urine Side effects that usually do not require medical attention (report to your doctor or health care professional if they continue or are bothersome):  hair loss  loss of appetite  metallic taste in the mouth or changes in taste This list may not describe all possible side effects. Call your doctor for medical advice about side effects. You may report side effects to FDA at 1-800-FDA-1088. Where should I keep my medicine? This drug is given in a hospital or clinic and will not be stored at home. NOTE: This sheet is a summary. It may not cover all possible information. If you have questions about this medicine, talk to your doctor, pharmacist, or health care provider.  2020 Elsevier/Gold Standard (2007-08-09  14:38:05)  Coronavirus (COVID-19) Are you at risk?  Are you at risk for the Coronavirus (COVID-19)?  To be considered HIGH RISK for Coronavirus (COVID-19), you have to meet the following criteria:  . Traveled to [place], [place], [place], [place] or [place]; or in the [place] to [place], [place], [place], or [place]; and have fever, cough, and shortness of breath within the last 2 weeks of travel OR . Been in close contact with a person diagnosed with COVID-19 within the last 2 weeks and have fever, cough, and shortness of breath . IF YOU DO NOT MEET THESE CRITERIA, YOU ARE CONSIDERED LOW RISK FOR COVID-19.  What to do if you are HIGH RISK for COVID-19?  . If you are having a medical emergency, call 911. . Seek medical care right away. Before you go to a doctor's office, urgent care or emergency department, call ahead and tell them about your recent travel, contact   with someone diagnosed with COVID-19, and your symptoms. You should receive instructions from your physician's office regarding next steps of care.  . When you arrive at healthcare provider, tell the healthcare staff immediately you have returned from visiting [place], [place], [place], [place] or [place]; or traveled in the [place] to [place], [place], [place], or [place]; in the last two weeks or you have been in close contact with a person diagnosed with COVID-19 in the last 2 weeks.   . Tell the health care staff about your symptoms: fever, cough and shortness of breath. . After you have been seen by a medical provider, you will be either: o Tested for (COVID-19) and discharged home on quarantine except to seek medical care if symptoms worsen, and asked to  - Stay home and avoid contact with others until you get your results (4-5 days)  - Avoid travel on public transportation if possible (such as bus, train, or airplane) or o Sent to the Emergency Department by EMS for evaluation, COVID-19 testing, and  possible admission depending on your condition and test results.  What to do if you are LOW RISK for COVID-19?  Reduce your risk of any infection by using the same precautions used for avoiding the common cold or flu:  . Wash your hands often with soap and warm water for at least 20 seconds.  If soap and water are not readily available, use an alcohol-based hand sanitizer with at least 60% alcohol.  . If coughing or sneezing, cover your mouth and nose by coughing or sneezing into the elbow areas of your shirt or coat, into a tissue or into your sleeve (not your hands). . Avoid shaking hands with others and consider head nods or verbal greetings only. . Avoid touching your eyes, nose, or mouth with unwashed hands.  . Avoid close contact with people who are sick. . Avoid places or events with large numbers of people in one location, like concerts or sporting events. . Carefully consider travel plans you have or are making. . If you are planning any travel outside or inside the [place], visit the CDC's Travelers' Health webpage for the latest health notices. . If you have some symptoms but not all symptoms, continue to monitor at home and seek medical attention if your symptoms worsen. . If you are having a medical emergency, call 911.   ADDITIONAL HEALTHCARE OPTIONS FOR PATIENTS  [facility] / e-Visit: [email or web address]         [facility]: [phone]  [facility]: [phone]                   [facility]: [phone]   

## 2019-03-07 ENCOUNTER — Telehealth: Payer: Self-pay | Admitting: *Deleted

## 2019-03-07 ENCOUNTER — Encounter: Payer: Self-pay | Admitting: Hematology and Oncology

## 2019-03-07 NOTE — Progress Notes (Signed)
Received income documentation for one-time $1000 [employer] grant via mailbox left by spouse.  Will have patient sign her approval letter and go over expenses and how they are covered.

## 2019-03-08 ENCOUNTER — Telehealth: Payer: Self-pay | Admitting: *Deleted

## 2019-03-08 NOTE — Telephone Encounter (Signed)
Patient's husband called with concerns that Caitlin [name] is experiencing pain in her thighs. She has been very uncomfortable. She has not tried using any OTC medication to help. She will try to use Tylenol and will call this clinic back if it does not alleviated the pain. Patient denies any other concerns at this time.   Will follow up with patient tomorrow.

## 2019-03-09 ENCOUNTER — Encounter: Payer: Self-pay | Admitting: Hematology and Oncology

## 2019-03-09 ENCOUNTER — Other Ambulatory Visit: Payer: Self-pay | Admitting: Hematology

## 2019-03-09 ENCOUNTER — Telehealth: Payer: Self-pay | Admitting: *Deleted

## 2019-03-09 DIAGNOSIS — C482 Malignant neoplasm of peritoneum, unspecified: Secondary | ICD-10-CM

## 2019-03-09 MED ORDER — HYDROCODONE-ACETAMINOPHEN 5-325 MG PO TABS: 1.0000 | ORAL_TABLET | Freq: Four times a day (QID) | ORAL | 0 refills | Status: DC | PRN

## 2019-03-09 NOTE — Progress Notes (Signed)
Called patient to schedule appointment to sign grant approval and go over expense sheet if interested before next appointment.  Patient agreed. Appointment scheduled for Tues 10/27 at 9am with me to finalize grant.  She has my card for any additional financial questions or concerns.

## 2019-03-09 NOTE — Telephone Encounter (Signed)
"  Kale Millstein spouse Esmarie Padrick [phone]).  Called yesterday and told to use OTC Ibuprofen, Tylenol for pain to hands, legs and feet which is a side effect of the chemotherapy and to call back after hours for on-call provider if no relief.   Also tried heating pad and cold packs with no relief.  Can something be sent to [facility] on [facility]."

## 2019-03-09 NOTE — Telephone Encounter (Signed)
I spoke with the patient, and have prescribed Norco q6hrs PRN x 30 tabs. If the pain improves, she should call the clinic for refill before she runs out.  Dr. Maylon Peppers

## 2019-03-09 NOTE — Telephone Encounter (Signed)
Awaiting instructions or orders if needed.  Patient and spouse notified no new orders at this time.  Continue use of OTC until further notice.

## 2019-03-10 ENCOUNTER — Telehealth: Payer: Self-pay

## 2019-03-10 ENCOUNTER — Other Ambulatory Visit: Payer: Self-pay | Admitting: Oncology

## 2019-03-10 DIAGNOSIS — C482 Malignant neoplasm of peritoneum, unspecified: Secondary | ICD-10-CM

## 2019-03-10 MED ORDER — GABAPENTIN 300 MG PO CAPS: 300.0000 mg | ORAL_CAPSULE | Freq: Three times a day (TID) | ORAL | 0 refills | Status: DC | PRN

## 2019-03-10 NOTE — Telephone Encounter (Signed)
Patient husband called to inform that her pain in hands, feet and legs continue as well as fatigue and inability to sleep.  Norco that was prescribed is upsetting her stomach and wants something else if possible.  Spoke with on call MD and patient prescribed Gabapentin.  Patient knows to call center if no improvement by next week.

## 2019-03-10 NOTE — Progress Notes (Signed)
Gabapentin 300 mg TID PRN sent into patient pharmacy today per Dr. Jana Hakim.  Norco that was previously prescribed is upsetting patient's stomach.

## 2019-03-13 ENCOUNTER — Telehealth: Payer: Self-pay | Admitting: *Deleted

## 2019-03-13 NOTE — Telephone Encounter (Signed)
-----   Message from Heath Lark, MD sent at 03/13/2019  8:09 AM EDT ----- Regarding: pain management Dr. Maylon Peppers wrote for a few pain medication last week for bone pain after chemo Is she better now? Does she needs to be evaluated or wait until next visit?

## 2019-03-13 NOTE — Telephone Encounter (Signed)
Husband advised appt scheduled to see Dr. Alvy Bimler tomorrow following the financial appt.

## 2019-03-13 NOTE — Telephone Encounter (Signed)
I can see her tomorrow at 9:45 am

## 2019-03-13 NOTE — Telephone Encounter (Signed)
Called to check on patient. Per husband he is frustrated that she is not really back to her "normal" self. She is still very fatigued and foggy. She will just sit and stare. She is not doing her normal activities. She is stating she is not able to continue the treatment plan if she feels like this afterward. The pain is better controlled with the Gabapentin more so than the Norco.   Husband would like patient to be evaluated sooner than next month. They have an appt with financial tomorrow at [place].

## 2019-03-14 ENCOUNTER — Other Ambulatory Visit: Payer: Self-pay

## 2019-03-14 ENCOUNTER — Encounter: Payer: Self-pay | Admitting: Hematology and Oncology

## 2019-03-14 ENCOUNTER — Inpatient Hospital Stay: Payer: Managed Care, Other (non HMO)

## 2019-03-14 ENCOUNTER — Inpatient Hospital Stay (HOSPITAL_BASED_OUTPATIENT_CLINIC_OR_DEPARTMENT_OTHER): Payer: Managed Care, Other (non HMO) | Admitting: Hematology and Oncology

## 2019-03-14 DIAGNOSIS — F19982 Other psychoactive substance use, unspecified with psychoactive substance-induced sleep disorder: Secondary | ICD-10-CM | POA: Diagnosis not present

## 2019-03-14 DIAGNOSIS — M898X9 Other specified disorders of bone, unspecified site: Secondary | ICD-10-CM | POA: Insufficient documentation

## 2019-03-14 DIAGNOSIS — Z5111 Encounter for antineoplastic chemotherapy: Secondary | ICD-10-CM | POA: Diagnosis not present

## 2019-03-14 DIAGNOSIS — G4700 Insomnia, unspecified: Secondary | ICD-10-CM | POA: Insufficient documentation

## 2019-03-14 DIAGNOSIS — C482 Malignant neoplasm of peritoneum, unspecified: Secondary | ICD-10-CM

## 2019-03-14 NOTE — Progress Notes (Signed)
Met with patient and spouse at lobby area to have grant paperwork signed and reviewed expense sheet.  Patient approved for the one-time $1000 [employer]. She has a copy of the approval letter as well as the expense sheet. Discussed in detail expenses and how they are covered.  Asked about ded/OOP to determine if copay assistance is needed. Patient states she has met everything and her plan renews in June of 2021. Advised to contact me if anything changes regarding this.  She has my card for any additional financial questions or concerns.

## 2019-03-14 NOTE — Progress Notes (Signed)
[place] OFFICE PROGRESS NOTE  Patient Care Team: [place]. 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[place]) Her husband is visibly upset with her recent clinical course after treatment We discussed expected side effects of treatment She is recovering well from recent side effects For future chemotherapy, I plan to see her within 2 days of chemotherapy to assess and to manage side effects of treatment They are in agreement with the plan of care  Bone pain She experience profound side effects with bone pain within 2 days of chemotherapy Over-the-counter analgesics such as Tylenol or ibuprofen was not helpful but the prescribed hydrocodone/Vicodin was too strong Currently, her pain has resolved I will see her in the future within 2 days of chemotherapy to manage her side effects We discussed other conservative approach such as heat pad and Claritin to be used in the future  Insomnia disorder Her insomnia disorder has resolved We discussed management of insomnia in the future due to his corticosteroid therapy   No orders of the defined types were placed in this encounter.   INTERVAL HISTORY: Please see below for problem oriented charting. She is seen with her husband today to review plan of care Her husband was quite upset due to her inability to cope with some of the side effects from chemotherapy She could not sleep well within the first few days after treatment Within 48 hours of chemotherapy, she developed severe and diffuse bone pain Over-the-counter analgesics such as Tylenol or ibuprofen was not helpful Ultimately, when he contacted the cancer center for help, she was prescribed narcotic prescription With narcotic prescription, her pain was well controlled but then she became excessively sedated She also have poor oral intake and excessive fatigue during that time Her pain eventually went away and she become  more alert and needed to take less pain medicine She is now recovered back to her baseline She denies nausea or vomiting after chemotherapy SUMMARY OF ONCOLOGIC HISTORY: Oncology History  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 These are unchanged from November 2017 and are benign in nature. No new nodules.    01/03/2019 Imaging   MRI pelvis Uterus: The uterus measures 5.8 x 9.3 x 5.8 cm. Unremarkable ENDOMETRIUM. Multiple uterine body intramural fibroids measuring up to 14 mm in size.  Mild endometrial fluid.   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 - Carcinoma appears to be arising in adnexal soft tissue, considered most consistent with stage pT2 (see comment) - Ovary with physiologic changes and and fallopian tube with paratubal cyst and surface adhesions; no definite parenchymal involvement by carcinoma identified - See synoptic report and comment  B: Lymph nodes, right common iliac, lymphadenectomy - Seven lymph nodes with no metastatic carcinoma identified (0/7)  C: Uterus with cervix and left ovary and fallopian tube, hysterectomy and left salpingo-oophorectomy Cervix: Ectocervix and endocervix with no dysplasia or malignancy identified Endometrium: Endometrium with hemorrhage and weakly proliferative features; no hyperplasia, atypia, or malignancy identified Myometrium: Adenomyosis and benign leiomyomata measuring up to 1.1 cm Ovary, left: Hemorrhagic follicular cyst, small serous inclusions, and physiologic changes Fallopian tube, left: Paratubal cysts and no malignancy identified  D: Lymph nodes, right pelvic, lymphadenectomy - Nine lymph nodes with no  metastatic carcinoma identified (0/9)  E: Lymph nodes, left  pelvic, lymphadenectomy - Three lymph nodes with no metastatic carcinoma identified (0/3)  F: Peritoneum, left abdominal, biopsy - Fibroadipose tissue with no malignancy identified  G: Peritoneum, right pelvic, biopsy - Fibroadipose tissue with no malignancy identified  H: Peritoneum, right abdominal, biopsy - Fibroadipose tissue with no malignancy identified  I: Peritoneum, left pelvic, biopsy - Fibroadipose tissue with no malignancy identified  J: Peritoneum, posterior cul de sac, biopsy - Fibroadipose tissue with calcifications and no malignancy identified  K: Peritoneum, anterior cul de sac, biopsy - Fibroadipose tissue with no malignancy identified  L: Omentum, omentectomy - Adipose and fibrovascular tissue consistent with omentum  - No malignancy identified    02/08/2019 Surgery   Preoperative Diagnoses: Adnexal mass, Obesity BMI 40  Postoperative Diagnoses: Right adnexal mass, fallopian tube adenocarcinoma; Obesity BMI 40  Procedures: Diagnostic laparoscopy, Robotic-assisted total laparoscopic hysterectomy, bilateral salpingo-oophorectomy, bilateral pelvic lymphadenectomy, right common iliac lymphadenectomy, peritoneal biopsies, minilaparotomy for infragastric ometectomy  Surgeon: Janie Morning, MD, PhD  Findings: Normal upper abdominal survey: normal liver surface, diaphragm and stomach, normal appearing small and large bowel, normal omentum. No evidence of peritoneal disease or carcinomatosis. Small amount of ascites in the posterior cul-de-sac upon entry. Enlarged right adnexal mass (~8cm), appears to be arising from the fallopian tube with an otherwise normal appearing ovary. There was no intraoperative, intraperitoneal rupture of the mass; controlled removal of the mass occurred extraperitoneally in a bag. Normal appearing uterus and left adnexa. Normal appearing omentum. Patient with a short mesentery precluding adequate visualization of the aorta. Small nodules in the  posterior cul-de-sac, biopsied; no additional peritoneal disease. R0 resection.    02/20/2019 Cancer Staging   Staging form: Ovary, Fallopian Tube, and Primary Peritoneal Carcinoma, AJCC 8th Edition - Pathologic: Stage II (pT2, pN0, cM0) - Signed by Heath Lark, MD on 02/20/2019   02/28/2019 Tumor Marker   Patient's tumor was tested for the following markers: CA-125 Results of the tumor marker test revealed 26.8.   03/03/2019 Procedure   Successful placement of a right IJ approach Power Port with ultrasound and fluoroscopic guidance. The catheter is ready for use.     REVIEW OF SYSTEMS:   Constitutional: Denies fevers, chills or abnormal weight loss Eyes: Denies blurriness of vision Ears, nose, mouth, throat, and face: Denies mucositis or sore throat Respiratory: Denies cough, dyspnea or wheezes Cardiovascular: Denies palpitation, chest discomfort or lower extremity swelling Gastrointestinal:  Denies nausea, heartburn or change in bowel habits Skin: Denies abnormal skin rashes Lymphatics: Denies new lymphadenopathy or easy bruising Neurological:Denies numbness, tingling or new weaknesses Behavioral/Psych: Mood is stable, no new changes  All other systems were reviewed with the patient and are negative.  I have reviewed the past medical history, past surgical history, social history and family history with the patient and they are unchanged from previous note.  ALLERGIES:  has No Known Allergies.  MEDICATIONS:  Current Outpatient Medications  Medication Sig Dispense Refill  . dexamethasone (DECADRON) 4 MG tablet Take 2 tabs at the night before and 2 tab the morning of chemotherapy, every 3 weeks, by mouth 24 tablet 9  . gabapentin (NEURONTIN) 300 MG capsule Take 1 capsule (300 mg total) by mouth 3 (three) times daily as needed. 90 capsule 0  . HYDROcodone-acetaminophen (NORCO) 5-325 MG tablet Take 1 tablet by mouth every 6 (six) hours as needed for moderate pain. 30 tablet 0  .  lidocaine-prilocaine (EMLA) cream Apply to affected area once 30  g 3  . ondansetron (ZOFRAN) 8 MG tablet Take 1 tablet (8 mg total) by mouth every 8 (eight) hours as needed for refractory nausea / vomiting. Start on day 3 after carboplatin chemo. 30 tablet 1  . prochlorperazine (COMPAZINE) 10 MG tablet Take 1 tablet (10 mg total) by mouth every 6 (six) hours as needed (Nausea or vomiting). 30 tablet 1   No current facility-administered medications for this visit.     PHYSICAL EXAMINATION: ECOG PERFORMANCE STATUS: 1 - Symptomatic but completely ambulatory  Vitals:   03/14/19 0953  BP: 130/79  Pulse: 93  Resp: 18  Temp: 98 F (36.7 C)  SpO2: 98%   Filed Weights   03/14/19 0953  Weight: 210 lb 11.2 oz (95.6 kg)    GENERAL:alert, no distress and comfortable Musculoskeletal:no cyanosis of digits and no clubbing  NEURO: alert & oriented x 3 with fluent speech, no focal motor/sensory deficits  LABORATORY DATA:  I have reviewed the data as listed    Component Value Date/Time   NA 143 02/28/2019 0951   K 4.2 02/28/2019 0951   CL 105 02/28/2019 0951   CO2 29 02/28/2019 0951   GLUCOSE 97 02/28/2019 0951   BUN 15 02/28/2019 0951   CREATININE 0.72 02/28/2019 0951   CALCIUM 10.0 02/28/2019 0951   PROT 7.6 02/28/2019 0951   ALBUMIN 3.9 02/28/2019 0951   AST 21 02/28/2019 0951   ALT 20 02/28/2019 0951   ALKPHOS 70 02/28/2019 0951   BILITOT 0.5 02/28/2019 0951   GFRNONAA >60 02/28/2019 0951   GFRAA >60 02/28/2019 0951    No results found for: SPEP, UPEP  Lab Results  Component Value Date   WBC 6.9 03/03/2019   NEUTROABS 4.2 02/28/2019   HGB 12.8 03/03/2019   HCT 40.8 03/03/2019   MCV 88.3 03/03/2019   PLT 310 03/03/2019      Chemistry      Component Value Date/Time   NA 143 02/28/2019 0951   K 4.2 02/28/2019 0951   CL 105 02/28/2019 0951   CO2 29 02/28/2019 0951   BUN 15 02/28/2019 0951   CREATININE 0.72 02/28/2019 0951      Component Value Date/Time   CALCIUM  10.0 02/28/2019 0951   ALKPHOS 70 02/28/2019 0951   AST 21 02/28/2019 0951   ALT 20 02/28/2019 0951   BILITOT 0.5 02/28/2019 0951       RADIOGRAPHIC STUDIES: I have personally reviewed the radiological images as listed and agreed with the findings in the report. Ir Imaging Guided Port Insertion  Result Date: 03/03/2019 INDICATION: 43 year old female with peritoneal adenocarcinoma. She presents for port catheter placement. EXAM: IMPLANTED PORT A CATH PLACEMENT WITH ULTRASOUND AND FLUOROSCOPIC GUIDANCE MEDICATIONS: 2 g Ancef; The antibiotic was administered within an appropriate time interval prior to skin puncture. ANESTHESIA/SEDATION: Versed 3 mg IV; Fentanyl 100 mcg IV; Moderate Sedation Time:  24 minutes The patient was continuously monitored during the procedure by the interventional radiology nurse under my direct supervision. FLUOROSCOPY TIME:  0 minutes, 18 seconds (12 mGy) COMPLICATIONS: None immediate. PROCEDURE: The right neck and chest was prepped with chlorhexidine, and draped in the usual sterile fashion using maximum barrier technique (cap and mask, sterile gown, sterile gloves, large sterile sheet, hand hygiene and cutaneous antiseptic). Local anesthesia was attained by infiltration with 1% lidocaine with epinephrine. Ultrasound demonstrated patency of the right internal jugular vein, and this was documented with an image. Under real-time ultrasound guidance, this vein was accessed with a 21 gauge micropuncture  needle and image documentation was performed. A small dermatotomy was made at the access site with an 11 scalpel. A 0.018" wire was advanced into the SVC and the access needle exchanged for a 33F micropuncture vascular sheath. The 0.018" wire was then removed and a 0.035" wire advanced into the IVC. An appropriate location for the subcutaneous reservoir was selected below the clavicle and an incision was made through the skin and underlying soft tissues. The subcutaneous tissues  were then dissected using a combination of blunt and sharp surgical technique and a pocket was formed. A single lumen power injectable portacatheter was then tunneled through the subcutaneous tissues from the pocket to the dermatotomy and the port reservoir placed within the subcutaneous pocket. The venous access site was then serially dilated and a peel away vascular sheath placed over the wire. The wire was removed and the port catheter advanced into position under fluoroscopic guidance. The catheter tip is positioned in the superior cavoatrial junction. This was documented with a spot image. The portacatheter was then tested and found to flush and aspirate well. The port was flushed with saline followed by 100 units/mL heparinized saline. The pocket was then closed in two layers using first subdermal inverted interrupted absorbable sutures followed by a running subcuticular suture. The epidermis was then sealed with Dermabond. The dermatotomy at the venous access site was also closed with Dermabond. IMPRESSION: Successful placement of a right IJ approach Power Port with ultrasound and fluoroscopic guidance. The catheter is ready for use. Electronically Signed   By: Jacqulynn Cadet M.D.   On: 03/03/2019 17:47    All questions were answered. The patient knows to call the clinic with any problems, questions or concerns. No barriers to learning was detected.  I spent 15 minutes counseling the patient face to face. The total time spent in the appointment was 20 minutes and more than 50% was on counseling and review of test results  Heath Lark, MD 03/14/2019 4:06 PM

## 2019-03-14 NOTE — Assessment & Plan Note (Signed)
She experience profound side effects with bone pain within 2 days of chemotherapy Over-the-counter analgesics such as Tylenol or ibuprofen was not helpful but the prescribed hydrocodone/Vicodin was too strong Currently, her pain has resolved I will see her in the future within 2 days of chemotherapy to manage her side effects We discussed other conservative approach such as heat pad and Claritin to be used in the future

## 2019-03-14 NOTE — Assessment & Plan Note (Signed)
Her husband is visibly upset with her recent clinical course after treatment We discussed expected side effects of treatment She is recovering well from recent side effects For future chemotherapy, I plan to see her within 2 days of chemotherapy to assess and to manage side effects of treatment They are in agreement with the plan of care

## 2019-03-14 NOTE — Assessment & Plan Note (Signed)
Her insomnia disorder has resolved We discussed management of insomnia in the future due to his corticosteroid therapy

## 2019-03-20 MED FILL — DEXAMETHASONE 4 MG TABLET: 4 | 21 days supply | Qty: 4 | Fill #1

## 2019-03-22 ENCOUNTER — Telehealth: Payer: Self-pay | Admitting: Oncology

## 2019-03-22 NOTE — Telephone Encounter (Signed)
Caitlin [name] called and wanted to make sure that we received her FMLA paperwork.  Advised her that we received it this morning.

## 2019-03-27 ENCOUNTER — Inpatient Hospital Stay: Payer: Managed Care, Other (non HMO)

## 2019-03-27 ENCOUNTER — Inpatient Hospital Stay: Payer: Managed Care, Other (non HMO) | Attending: Gynecologic Oncology

## 2019-03-27 ENCOUNTER — Encounter: Payer: Self-pay | Admitting: Hematology and Oncology

## 2019-03-27 ENCOUNTER — Inpatient Hospital Stay (HOSPITAL_BASED_OUTPATIENT_CLINIC_OR_DEPARTMENT_OTHER): Payer: Managed Care, Other (non HMO) | Admitting: Hematology and Oncology

## 2019-03-27 ENCOUNTER — Other Ambulatory Visit: Payer: Self-pay

## 2019-03-27 DIAGNOSIS — G4700 Insomnia, unspecified: Secondary | ICD-10-CM | POA: Insufficient documentation

## 2019-03-27 DIAGNOSIS — C482 Malignant neoplasm of peritoneum, unspecified: Secondary | ICD-10-CM | POA: Diagnosis not present

## 2019-03-27 DIAGNOSIS — G620 Drug-induced polyneuropathy: Secondary | ICD-10-CM | POA: Insufficient documentation

## 2019-03-27 DIAGNOSIS — Z90722 Acquired absence of ovaries, bilateral: Secondary | ICD-10-CM | POA: Insufficient documentation

## 2019-03-27 DIAGNOSIS — M898X9 Other specified disorders of bone, unspecified site: Secondary | ICD-10-CM

## 2019-03-27 DIAGNOSIS — Z79899 Other long term (current) drug therapy: Secondary | ICD-10-CM | POA: Diagnosis not present

## 2019-03-27 DIAGNOSIS — R918 Other nonspecific abnormal finding of lung field: Secondary | ICD-10-CM | POA: Insufficient documentation

## 2019-03-27 DIAGNOSIS — F19982 Other psychoactive substance use, unspecified with psychoactive substance-induced sleep disorder: Secondary | ICD-10-CM

## 2019-03-27 DIAGNOSIS — Z5111 Encounter for antineoplastic chemotherapy: Secondary | ICD-10-CM | POA: Diagnosis not present

## 2019-03-27 DIAGNOSIS — D251 Intramural leiomyoma of uterus: Secondary | ICD-10-CM | POA: Diagnosis not present

## 2019-03-27 DIAGNOSIS — Z90710 Acquired absence of both cervix and uterus: Secondary | ICD-10-CM | POA: Diagnosis not present

## 2019-03-27 LAB — CBC WITH DIFFERENTIAL (CANCER CENTER ONLY)
Abs Immature Granulocytes: 0.16 10*3/uL — ABNORMAL HIGH (ref 0.00–0.07)
Basophils Absolute: 0 10*3/uL (ref 0.0–0.1)
Basophils Relative: 0 %
Eosinophils Absolute: 0 10*3/uL (ref 0.0–0.5)
Eosinophils Relative: 0 %
HCT: 35.2 % — ABNORMAL LOW (ref 36.0–46.0)
Hemoglobin: 11.6 g/dL — ABNORMAL LOW (ref 12.0–15.0)
Immature Granulocytes: 1 %
Lymphocytes Relative: 9 %
Lymphs Abs: 1.1 10*3/uL (ref 0.7–4.0)
MCH: 27.8 pg (ref 26.0–34.0)
MCHC: 33 g/dL (ref 30.0–36.0)
MCV: 84.2 fL (ref 80.0–100.0)
Monocytes Absolute: 0.3 10*3/uL (ref 0.1–1.0)
Monocytes Relative: 2 %
Neutro Abs: 11.1 10*3/uL — ABNORMAL HIGH (ref 1.7–7.7)
Neutrophils Relative %: 88 %
Platelet Count: 293 10*3/uL (ref 150–400)
RBC: 4.18 MIL/uL (ref 3.87–5.11)
RDW: 11.2 % — ABNORMAL LOW (ref 11.5–15.5)
WBC Count: 12.6 10*3/uL — ABNORMAL HIGH (ref 4.0–10.5)
nRBC: 0 % (ref 0.0–0.2)

## 2019-03-27 LAB — CMP (CANCER CENTER ONLY)
ALT: 19 U/L (ref 0–44)
AST: 16 U/L (ref 15–41)
Albumin: 3.7 g/dL (ref 3.5–5.0)
Alkaline Phosphatase: 87 U/L (ref 38–126)
Anion gap: 12 (ref 5–15)
BUN: 15 mg/dL (ref 6–20)
CO2: 23 mmol/L (ref 22–32)
Calcium: 9.4 mg/dL (ref 8.9–10.3)
Chloride: 106 mmol/L (ref 98–111)
Creatinine: 0.82 mg/dL (ref 0.44–1.00)
GFR, Est AFR Am: >60 mL/min (ref >60)
GFR, Estimated: >60 mL/min (ref >60)
Glucose, Bld: 142 mg/dL — ABNORMAL HIGH (ref 70–99)
Potassium: 3.9 mmol/L (ref 3.5–5.1)
Sodium: 141 mmol/L (ref 135–145)
Total Bilirubin: <0.2 mg/dL — ABNORMAL LOW (ref 0.3–1.2)
Total Protein: 7.7 g/dL (ref 6.5–8.1)

## 2019-03-27 MED ORDER — FAMOTIDINE IN NACL 20-0.9 MG/50ML-% IV SOLN
20.0000 mg | Freq: Once | INTRAVENOUS | Status: AC
  Administered 2019-03-27: 20 mg via INTRAVENOUS

## 2019-03-27 MED ORDER — DIPHENHYDRAMINE HCL 50 MG/ML IJ SOLN
INTRAMUSCULAR | Status: AC
  Filled 2019-03-27: qty 1

## 2019-03-27 MED ORDER — PALONOSETRON HCL INJECTION 0.25 MG/5ML
0.2500 mg | Freq: Once | INTRAVENOUS | Status: AC
  Administered 2019-03-27: 0.25 mg via INTRAVENOUS

## 2019-03-27 MED ORDER — SODIUM CHLORIDE 0.9 % IV SOLN
175.0000 mg/m2 | Freq: Once | INTRAVENOUS | Status: AC
  Administered 2019-03-27: 366 mg via INTRAVENOUS
  Filled 2019-03-27: qty 61

## 2019-03-27 MED ORDER — SODIUM CHLORIDE 0.9 % IV SOLN
Freq: Once | INTRAVENOUS | Status: AC
  Administered 2019-03-27: 11:00:00 via INTRAVENOUS
  Filled 2019-03-27: qty 5

## 2019-03-27 MED ORDER — SODIUM CHLORIDE 0.9 % IV SOLN
Freq: Once | INTRAVENOUS | Status: AC
  Administered 2019-03-27: 11:00:00 via INTRAVENOUS
  Filled 2019-03-27: qty 250

## 2019-03-27 MED ORDER — SODIUM CHLORIDE 0.9% FLUSH
10.0000 mL | INTRAVENOUS | Status: DC | PRN
  Administered 2019-03-27: 10 mL
  Filled 2019-03-27: qty 10

## 2019-03-27 MED ORDER — PALONOSETRON HCL INJECTION 0.25 MG/5ML
INTRAVENOUS | Status: AC
  Filled 2019-03-27: qty 5

## 2019-03-27 MED ORDER — HEPARIN SOD (PORK) LOCK FLUSH 100 UNIT/ML IV SOLN
500.0000 [IU] | Freq: Once | INTRAVENOUS | Status: AC | PRN
  Administered 2019-03-27: 500 [IU]
  Filled 2019-03-27: qty 5

## 2019-03-27 MED ORDER — SODIUM CHLORIDE 0.9% FLUSH
10.0000 mL | Freq: Once | INTRAVENOUS | Status: AC
  Administered 2019-03-27: 10 mL
  Filled 2019-03-27: qty 10

## 2019-03-27 MED ORDER — FAMOTIDINE IN NACL 20-0.9 MG/50ML-% IV SOLN
INTRAVENOUS | Status: AC
  Filled 2019-03-27: qty 50

## 2019-03-27 MED ORDER — DIPHENHYDRAMINE HCL 50 MG/ML IJ SOLN
50.0000 mg | Freq: Once | INTRAMUSCULAR | Status: AC
  Administered 2019-03-27: 50 mg via INTRAVENOUS

## 2019-03-27 MED ORDER — SODIUM CHLORIDE 0.9 % IV SOLN
750.0000 mg | Freq: Once | INTRAVENOUS | Status: AC
  Administered 2019-03-27: 15:00:00 750 mg via INTRAVENOUS
  Filled 2019-03-27: qty 75

## 2019-03-27 NOTE — Assessment & Plan Note (Signed)
She is sleeping well but will likely have interrupted sleep pattern with chemotherapy and steroids If she have no infusion reaction today, I plan to reduce oral premedication dexamethasone in the future

## 2019-03-27 NOTE — Assessment & Plan Note (Signed)
She is fully recovered from recent side effects of chemotherapy Per previous discussion, I plan to see her again in 2 days for symptom management and assessment of side effects of therapy

## 2019-03-27 NOTE — Progress Notes (Signed)
[facility] OFFICE PROGRESS NOTE  Patient Care Team: [place]. 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[place]) She is fully recovered from recent side effects of chemotherapy Per previous discussion, I plan to see her again in 2 days for symptom management and assessment of side effects of therapy  Insomnia disorder She is sleeping well but will likely have interrupted sleep pattern with chemotherapy and steroids If she have no infusion reaction today, I plan to reduce oral premedication dexamethasone in the future  Bone pain She has no bone pain since her last time I saw her but will likely have to side effects from chemotherapy causing recurrence of bone pain again She is on gabapentin She had pain medicine to take as needed As above, I plan to see her again in 2 days for further assessment and pain management   No orders of the defined types were placed in this encounter.   INTERVAL HISTORY: Please see below for problem oriented charting. 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 These are unchanged from November 2017 and are benign in nature. No new nodules.    01/03/2019 Imaging   MRI pelvis Uterus: The uterus measures 5.8 x 9.3 x 5.8 cm. Unremarkable ENDOMETRIUM. Multiple uterine body intramural fibroids measuring up to 14 mm in size.  Mild endometrial fluid.   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 - Carcinoma appears to be arising in adnexal soft tissue, considered most consistent with stage pT2 (see comment) - Ovary with physiologic changes and and fallopian tube with paratubal cyst and surface adhesions; no definite parenchymal involvement by carcinoma identified - See synoptic report and comment  B: Lymph nodes, right common iliac, lymphadenectomy - Seven lymph nodes with no metastatic carcinoma identified (0/7)  C: Uterus with cervix and left ovary and fallopian tube, hysterectomy and left salpingo-oophorectomy Cervix: Ectocervix and endocervix with no dysplasia or malignancy identified Endometrium: Endometrium with hemorrhage and weakly proliferative features; no hyperplasia, atypia, or malignancy identified Myometrium: Adenomyosis and benign leiomyomata measuring up to 1.1 cm Ovary, left: Hemorrhagic follicular cyst, small serous inclusions, and physiologic changes Fallopian tube, left: Paratubal cysts and no malignancy identified  D: Lymph nodes, right pelvic, lymphadenectomy - Nine lymph nodes with no metastatic carcinoma identified (0/9)  E: Lymph nodes, left pelvic, lymphadenectomy - Three lymph nodes with no metastatic carcinoma identified (0/3)  F: Peritoneum, left abdominal, biopsy - Fibroadipose tissue with no malignancy identified  G: Peritoneum, right pelvic, biopsy - Fibroadipose tissue with no malignancy identified  H: Peritoneum, right abdominal, biopsy - Fibroadipose tissue with no malignancy identified  I: Peritoneum, left pelvic, biopsy - Fibroadipose tissue with no malignancy identified  J: Peritoneum, posterior cul de sac, biopsy - Fibroadipose tissue with calcifications and no malignancy identified  K: Peritoneum, anterior cul de sac, biopsy - Fibroadipose tissue with no malignancy identified  L: Omentum, omentectomy - Adipose and fibrovascular tissue consistent with omentum  - No  malignancy identified    02/08/2019  Surgery   Preoperative Diagnoses: Adnexal mass, Obesity BMI 40  Postoperative Diagnoses: Right adnexal mass, fallopian tube adenocarcinoma; Obesity BMI 40  Procedures: Diagnostic laparoscopy, Robotic-assisted total laparoscopic hysterectomy, bilateral salpingo-oophorectomy, bilateral pelvic lymphadenectomy, right common iliac lymphadenectomy, peritoneal biopsies, minilaparotomy for infragastric ometectomy  Surgeon: Janie Morning, MD, PhD  Findings: Normal upper abdominal survey: normal liver surface, diaphragm and stomach, normal appearing small and large bowel, normal omentum. No evidence of peritoneal disease or carcinomatosis. Small amount of ascites in the posterior cul-de-sac upon entry. Enlarged right adnexal mass (~8cm), appears to be arising from the fallopian tube with an otherwise normal appearing ovary. There was no intraoperative, intraperitoneal rupture of the mass; controlled removal of the mass occurred extraperitoneally in a bag. Normal appearing uterus and left adnexa. Normal appearing omentum. Patient with a short mesentery precluding adequate visualization of the aorta. Small nodules in the posterior cul-de-sac, biopsied; no additional peritoneal disease. R0 resection.    02/20/2019 Cancer Staging   Staging form: Ovary, Fallopian Tube, and Primary Peritoneal Carcinoma, AJCC 8th Edition - Pathologic: Stage II (pT2, pN0, cM0) - Signed by Heath Lark, MD on 02/20/2019   02/28/2019 Tumor Marker   Patient's tumor was tested for the following markers: CA-125 Results of the tumor marker test revealed 26.8.   03/03/2019 Procedure   Successful placement of a right IJ approach Power Port with ultrasound and fluoroscopic guidance. The catheter is ready for use.     REVIEW OF SYSTEMS:   Constitutional: Denies fevers, chills or abnormal weight loss Eyes: Denies blurriness of vision Ears, nose, mouth, throat, and face: Denies mucositis or sore throat Respiratory: Denies cough, dyspnea  or wheezes Cardiovascular: Denies palpitation, chest discomfort or lower extremity swelling Gastrointestinal:  Denies nausea, heartburn or change in bowel habits Skin: Denies abnormal skin rashes Lymphatics: Denies new lymphadenopathy or easy bruising Neurological:Denies numbness, tingling or new weaknesses Behavioral/Psych: Mood is stable, no new changes  All other systems were reviewed with the patient and are negative.  I have reviewed the past medical history, past surgical history, social history and family history with the patient and they are unchanged from previous note.  ALLERGIES:  has No Known Allergies.  MEDICATIONS:  Current Outpatient Medications  Medication Sig Dispense Refill  . dexamethasone (DECADRON) 4 MG tablet Take 2 tabs at the night before and 2 tab the morning of chemotherapy, every 3 weeks, by mouth 24 tablet 9  . gabapentin (NEURONTIN) 300 MG capsule Take 1 capsule (300 mg total) by mouth 3 (three) times daily as needed. 90 capsule 0  . HYDROcodone-acetaminophen (NORCO) 5-325 MG tablet Take 1 tablet by mouth every 6 (six) hours as needed for moderate pain. 30 tablet 0  . lidocaine-prilocaine (EMLA) cream Apply to affected area once 30 g 3  . ondansetron (ZOFRAN) 8 MG tablet Take 1 tablet (8 mg total) by mouth every 8 (eight) hours as needed for refractory nausea / vomiting. Start on day 3 after carboplatin chemo. 30 tablet 1  . prochlorperazine (COMPAZINE) 10 MG tablet Take 1 tablet (10 mg total) by mouth every 6 (six) hours as needed (Nausea or vomiting). 30 tablet 1   No current facility-administered medications for this visit.    Facility-Administered Medications Ordered in Other Visits  Medication Dose Route Frequency Provider Last Rate Last Dose  . CARBOplatin (PARAPLATIN) 750 mg in sodium chloride 0.9 % 250 mL chemo infusion  750 mg Intravenous Once Heath Lark, MD      .  diphenhydrAMINE (BENADRYL) injection 50 mg  50 mg Intravenous Once Alvy Bimler, Wendle Kina, MD       . famotidine (PEPCID) IVPB 20 mg premix  20 mg Intravenous Once Alvy Bimler, Darely Becknell, MD      . fosaprepitant (EMEND) 150 mg, dexamethasone (DECADRON) 12 mg in sodium chloride 0.9 % 145 mL IVPB   Intravenous Once Alvy Bimler, Amalya Salmons, MD      . heparin lock flush 100 unit/mL  500 Units Intracatheter Once PRN Alvy Bimler, Jeree Delcid, MD      . PACLitaxel (TAXOL) 366 mg in sodium chloride 0.9 % 500 mL chemo infusion (> 80mg /m2)  175 mg/m2 (Treatment Plan Recorded) Intravenous Once Alvy Bimler, Kaulana Brindle, MD      . palonosetron (ALOXI) injection 0.25 mg  0.25 mg Intravenous Once Carmine Carrozza, MD      . sodium chloride flush (NS) 0.9 % injection 10 mL  10 mL Intracatheter PRN Alvy Bimler, Emin Foree, MD        PHYSICAL EXAMINATION: ECOG PERFORMANCE STATUS: 0 - Asymptomatic  Vitals:   03/27/19 0956  BP: 140/82  Pulse: 82  Resp: 18  Temp: 97.9 F (36.6 C)  SpO2: 99%   Filed Weights   03/27/19 0956  Weight: 214 lb 14.4 oz (97.5 kg)    GENERAL:alert, no distress and comfortable SKIN: skin color, texture, turgor are normal, no rashes or significant lesions EYES: normal, Conjunctiva are pink and non-injected, sclera clear OROPHARYNX:no exudate, no erythema and lips, buccal mucosa, and tongue normal  NECK: supple, thyroid normal size, non-tender, without nodularity LYMPH:  no palpable lymphadenopathy in the cervical, axillary or inguinal LUNGS: clear to auscultation and percussion with normal breathing effort HEART: regular rate & rhythm and no murmurs and no lower extremity edema ABDOMEN:abdomen soft, non-tender and normal bowel sounds Musculoskeletal:no cyanosis of digits and no clubbing  NEURO: alert & oriented x 3 with fluent speech, no focal motor/sensory deficits  LABORATORY DATA:  I have reviewed the data as listed    Component Value Date/Time   NA 141 03/27/2019 0939   K 3.9 03/27/2019 0939   CL 106 03/27/2019 0939   CO2 23 03/27/2019 0939   GLUCOSE 142 (H) 03/27/2019 0939   BUN 15 03/27/2019 0939   CREATININE 0.82  03/27/2019 0939   CALCIUM 9.4 03/27/2019 0939   PROT 7.7 03/27/2019 0939   ALBUMIN 3.7 03/27/2019 0939   AST 16 03/27/2019 0939   ALT 19 03/27/2019 0939   ALKPHOS 87 03/27/2019 0939   BILITOT <0.2 (L) 03/27/2019 0939   GFRNONAA >60 03/27/2019 0939   GFRAA >60 03/27/2019 0939    No results found for: SPEP, UPEP  Lab Results  Component Value Date   WBC 12.6 (H) 03/27/2019   NEUTROABS 11.1 (H) 03/27/2019   HGB 11.6 (L) 03/27/2019   HCT 35.2 (L) 03/27/2019   MCV 84.2 03/27/2019   PLT 293 03/27/2019      Chemistry      Component Value Date/Time   NA 141 03/27/2019 0939   K 3.9 03/27/2019 0939   CL 106 03/27/2019 0939   CO2 23 03/27/2019 0939   BUN 15 03/27/2019 0939   CREATININE 0.82 03/27/2019 0939      Component Value Date/Time   CALCIUM 9.4 03/27/2019 0939   ALKPHOS 87 03/27/2019 0939   AST 16 03/27/2019 0939   ALT 19 03/27/2019 0939   BILITOT <0.2 (L) 03/27/2019 0939       RADIOGRAPHIC STUDIES: I have personally reviewed the radiological images as listed and agreed with the findings  in the report. Ir Imaging Guided Port Insertion  Result Date: 03/03/2019 INDICATION: 43 year old female with peritoneal adenocarcinoma. She presents for port catheter placement. EXAM: IMPLANTED PORT A CATH PLACEMENT WITH ULTRASOUND AND FLUOROSCOPIC GUIDANCE MEDICATIONS: 2 g Ancef; The antibiotic was administered within an appropriate time interval prior to skin puncture. ANESTHESIA/SEDATION: Versed 3 mg IV; Fentanyl 100 mcg IV; Moderate Sedation Time:  24 minutes The patient was continuously monitored during the procedure by the interventional radiology nurse under my direct supervision. FLUOROSCOPY TIME:  0 minutes, 18 seconds (12 mGy) COMPLICATIONS: None immediate. PROCEDURE: The right neck and chest was prepped with chlorhexidine, and draped in the usual sterile fashion using maximum barrier technique (cap and mask, sterile gown, sterile gloves, large sterile sheet, hand hygiene and  cutaneous antiseptic). Local anesthesia was attained by infiltration with 1% lidocaine with epinephrine. Ultrasound demonstrated patency of the right internal jugular vein, and this was documented with an image. Under real-time ultrasound guidance, this vein was accessed with a 21 gauge micropuncture needle and image documentation was performed. A small dermatotomy was made at the access site with an 11 scalpel. A 0.018" wire was advanced into the SVC and the access needle exchanged for a 18F micropuncture vascular sheath. The 0.018" wire was then removed and a 0.035" wire advanced into the IVC. An appropriate location for the subcutaneous reservoir was selected below the clavicle and an incision was made through the skin and underlying soft tissues. The subcutaneous tissues were then dissected using a combination of blunt and sharp surgical technique and a pocket was formed. A single lumen power injectable portacatheter was then tunneled through the subcutaneous tissues from the pocket to the dermatotomy and the port reservoir placed within the subcutaneous pocket. The venous access site was then serially dilated and a peel away vascular sheath placed over the wire. The wire was removed and the port catheter advanced into position under fluoroscopic guidance. The catheter tip is positioned in the superior cavoatrial junction. This was documented with a spot image. The portacatheter was then tested and found to flush and aspirate well. The port was flushed with saline followed by 100 units/mL heparinized saline. The pocket was then closed in two layers using first subdermal inverted interrupted absorbable sutures followed by a running subcuticular suture. The epidermis was then sealed with Dermabond. The dermatotomy at the venous access site was also closed with Dermabond. IMPRESSION: Successful placement of a right IJ approach Power Port with ultrasound and fluoroscopic guidance. The catheter is ready for use.  Electronically Signed   By: Jacqulynn Cadet M.D.   On: 03/03/2019 17:47    All questions were answered. The patient knows to call the clinic with any problems, questions or concerns. No barriers to learning was detected.  I spent 15 minutes counseling the patient face to face. The total time spent in the appointment was 20 minutes and more than 50% was on counseling and review of test results  Heath Lark, MD 03/27/2019 10:44 AM

## 2019-03-27 NOTE — Assessment & Plan Note (Signed)
She has no bone pain since her last time I saw her but will likely have to side effects from chemotherapy causing recurrence of bone pain again She is on gabapentin She had pain medicine to take as needed As above, I plan to see her again in 2 days for further assessment and pain management

## 2019-03-27 NOTE — Patient Instructions (Signed)

## 2019-03-27 NOTE — Patient Instructions (Signed)
[facility] Discharge Instructions for Patients Receiving Chemotherapy  Today you received the following chemotherapy agents: Paclitaxel (Taxol) and Carboplatin (Paraplatin)  To help prevent nausea and vomiting after your treatment, we encourage you to take your nausea medication as directed. Received Aloxi during treatment today-->For the next 3 days take your Compazine prescription (not your Zofran) as needed for break through nausea. You can add the Zofran into you regimen starting Thursday as needed.   If you develop nausea and vomiting that is not controlled by your nausea medication, call the clinic.   BELOW ARE SYMPTOMS THAT SHOULD BE REPORTED IMMEDIATELY:  *FEVER GREATER THAN 100.5 F  *CHILLS WITH OR WITHOUT FEVER  NAUSEA AND VOMITING THAT IS NOT CONTROLLED WITH YOUR NAUSEA MEDICATION  *UNUSUAL SHORTNESS OF BREATH  *UNUSUAL BRUISING OR BLEEDING  TENDERNESS IN MOUTH AND THROAT WITH OR WITHOUT PRESENCE OF ULCERS  *URINARY PROBLEMS  *BOWEL PROBLEMS  UNUSUAL RASH Items with * indicate a potential emergency and should be followed up as soon as possible.  Feel free to call the clinic should you have any questions or concerns. The clinic phone number is [phone].  Please show the [place] at check-in to the Emergency Department and triage nurse.  Coronavirus (COVID-19) Are you at risk?  Are you at risk for the Coronavirus (COVID-19)?  To be considered HIGH RISK for Coronavirus (COVID-19), you have to meet the following criteria:  . Traveled to [place], [place], [place], [place] or [place]; or in the [place] to [place], [place], [place], or [place]; and have fever, cough, and shortness of breath within the last 2 weeks of travel OR . Been in close contact with a person diagnosed with COVID-19 within the last 2 weeks and have fever, cough, and shortness of breath . IF YOU DO NOT MEET THESE CRITERIA, YOU ARE CONSIDERED LOW RISK FOR  COVID-19.  What to do if you are HIGH RISK for COVID-19?  Marland Kitchen If you are having a medical emergency, call 911. . Seek medical care right away. Before you go to a doctor's office, urgent care or emergency department, call ahead and tell them about your recent travel, contact with someone diagnosed with COVID-19, and your symptoms. You should receive instructions from your physician's office regarding next steps of care.  . When you arrive at healthcare provider, tell the healthcare staff immediately you have returned from visiting [place], [place], [place], [place] or [place]; or traveled in the [place] to [place], [place], [place], or [place]; in the last two weeks or you have been in close contact with a person diagnosed with COVID-19 in the last 2 weeks.   . Tell the health care staff about your symptoms: fever, cough and shortness of breath. . After you have been seen by a medical provider, you will be either: o Tested for (COVID-19) and discharged home on quarantine except to seek medical care if symptoms worsen, and asked to  - Stay home and avoid contact with others until you get your results (4-5 days)  - Avoid travel on public transportation if possible (such as bus, train, or airplane) or o Sent to the Emergency Department by EMS for evaluation, COVID-19 testing, and possible admission depending on your condition and test results.  What to do if you are LOW RISK for COVID-19?  Reduce your risk of any infection by using the same precautions used for avoiding the common cold or flu:  Marland Kitchen Wash  your hands often with soap and warm water for at least 20 seconds.  If soap and water are not readily available, use an alcohol-based hand sanitizer with at least 60% alcohol.  . If coughing or sneezing, cover your mouth and nose by coughing or sneezing into the elbow areas of your shirt or coat, into a tissue or into your sleeve (not your hands). . Avoid shaking hands with others and consider  head nods or verbal greetings only. . Avoid touching your eyes, nose, or mouth with unwashed hands.  . Avoid close contact with people who are sick. . Avoid places or events with large numbers of people in one location, like concerts or sporting events. . Carefully consider travel plans you have or are making. . If you are planning any travel outside or inside the [place], visit the CDC's Travelers' Health webpage for the latest health notices. . If you have some symptoms but not all symptoms, continue to monitor at home and seek medical attention if your symptoms worsen. . If you are having a medical emergency, call 911.   [place] / e-Visit: [email or web address]         [facility]: [facility]: [id number]                   [facility]: [phone]

## 2019-03-28 LAB — CA 125: Cancer Antigen (CA) 125: 8.8 U/mL (ref 0.0–38.1)

## 2019-03-29 ENCOUNTER — Ambulatory Visit (INDEPENDENT_AMBULATORY_CARE_PROVIDER_SITE_OTHER): Payer: Managed Care, Other (non HMO) | Admitting: Neurology

## 2019-03-29 ENCOUNTER — Encounter: Payer: Self-pay | Admitting: Neurology

## 2019-03-29 ENCOUNTER — Inpatient Hospital Stay (HOSPITAL_BASED_OUTPATIENT_CLINIC_OR_DEPARTMENT_OTHER): Payer: Managed Care, Other (non HMO) | Admitting: Hematology and Oncology

## 2019-03-29 ENCOUNTER — Encounter: Payer: Self-pay | Admitting: Hematology and Oncology

## 2019-03-29 ENCOUNTER — Other Ambulatory Visit: Payer: Self-pay

## 2019-03-29 VITALS — BP 123/74 | HR 81 | Temp 97.8°F | Ht 62.0 in | Wt 220.0 lb

## 2019-03-29 DIAGNOSIS — T451X5S Adverse effect of antineoplastic and immunosuppressive drugs, sequela: Secondary | ICD-10-CM

## 2019-03-29 DIAGNOSIS — M898X9 Other specified disorders of bone, unspecified site: Secondary | ICD-10-CM

## 2019-03-29 DIAGNOSIS — M542 Cervicalgia: Secondary | ICD-10-CM | POA: Diagnosis not present

## 2019-03-29 DIAGNOSIS — C482 Malignant neoplasm of peritoneum, unspecified: Secondary | ICD-10-CM

## 2019-03-29 DIAGNOSIS — F19982 Other psychoactive substance use, unspecified with psychoactive substance-induced sleep disorder: Secondary | ICD-10-CM | POA: Diagnosis not present

## 2019-03-29 DIAGNOSIS — R202 Paresthesia of skin: Secondary | ICD-10-CM

## 2019-03-29 DIAGNOSIS — T451X5A Adverse effect of antineoplastic and immunosuppressive drugs, initial encounter: Secondary | ICD-10-CM | POA: Insufficient documentation

## 2019-03-29 DIAGNOSIS — Z9884 Bariatric surgery status: Secondary | ICD-10-CM | POA: Insufficient documentation

## 2019-03-29 DIAGNOSIS — Z5111 Encounter for antineoplastic chemotherapy: Secondary | ICD-10-CM | POA: Diagnosis not present

## 2019-03-29 MED ORDER — GABAPENTIN 100 MG PO CAPS: 100.0000 mg | ORAL_CAPSULE | Freq: Three times a day (TID) | ORAL | 11 refills | Status: DC

## 2019-03-29 NOTE — Progress Notes (Signed)
PATIENT: Caitlin [name] DOB: 03-Sep-1975  Chief Complaint  Patient presents with  . Numbness    She is here with her husband, [name]. Reports bilateral arm numbness.  She had negative NCV/EMG.  She is currently in chemotherapy.  Symptoms were present prior to starting it but have worsened.  . Orthopaedics    Daryll Brod, MD - referring provider  . PCP    [place] is a 43 year old female, accompanied by her husband [name], seen in request by her primary care physician from [facility], and orthopedic surgeon Daryll Brod for evaluation of bilateral hands and feet paresthesia, initial evaluation was on March 29, 2019.  I have reviewed and summarized the referring note from the referring physician, also her oncology evaluation from Dr. Alvy Bimler, she had a past medical history of obesity, bariatric surgery in 2017, with 60 pounds of weight loss, she was diagnosed with right fallopian tube adenocarcinoma, and primary peritoneal carcinoma, had robotic assistant total laparoscopic hysterectomy, bilateral salpingo-oophorectomy, bilateral pelvic lymphadenectomy, right common iliac lymphadenectomy, peritoneal biopsy, lobectomy in September 2020, this is followed by chemotherapy since October 19, again on November 9 with Paclitaxel, Carboplatin   She used to work as a Research scientist (physical sciences), around May 2020, she noticed intermittent bilateral hands paresthesia, sometimes wake her up from sleep, her bilateral fingertips paresthesia become more obvious 2 days following her first chemotherapy on March 06, 2019, she describes 6 out of 10 constant bilateral fingertip numb tingly achy sensation, radiating to bilateral forearm, she also complains of neck pain, radiating pain to bilateral shoulder, also described bilateral feet, below the knee paresthesia, achy pain  She has taking gabapentin 300 mg every night, which does help her sleep better, but  complains of excessive fatigue and drowsiness during the day  She denies significant weakness, mild gait abnormality due to heel pain chest tube insertion site of back surgery more she denies bowel and bladder incontinence   REVIEW OF SYSTEMS: Full 14 system review of systems performed and notable only for as above All other review of systems were negative.  ALLERGIES: No Known Allergies  HOME MEDICATIONS: Current Outpatient Medications  Medication Sig Dispense Refill  . acetaminophen (TYLENOL) 325 MG tablet Take 650 mg by mouth every 6 (six) hours as needed.    Marland Kitchen dexamethasone (DECADRON) 4 MG tablet Take 2 tabs at the night before and 2 tab the morning of chemotherapy, every 3 weeks, by mouth 24 tablet 9  . gabapentin (NEURONTIN) 300 MG capsule Take 1 capsule (300 mg total) by mouth 3 (three) times daily as needed. 90 capsule 0  . ibuprofen (ADVIL) 600 MG tablet Take 600 mg by mouth every 6 (six) hours as needed.    . lidocaine-prilocaine (EMLA) cream Apply to affected area once 30 g 3  . ondansetron (ZOFRAN) 8 MG tablet Take 1 tablet (8 mg total) by mouth every 8 (eight) hours as needed for refractory nausea / vomiting. Start on day 3 after carboplatin chemo. 30 tablet 1  . prochlorperazine (COMPAZINE) 10 MG tablet Take 1 tablet (10 mg total) by mouth every 6 (six) hours as needed (Nausea or vomiting). 30 tablet 1   No current facility-administered medications for this visit.     PAST MEDICAL HISTORY: Past Medical History:  Diagnosis Date  . Bacterial vaginitis   . Cancer [facility])    Primary peritoneal carcinoma  . Depression   . Family history of cervical cancer   .  Family history of ovarian cancer   . Family history of throat cancer   . Hypercholesterolemia 05/17/2017    PAST SURGICAL HISTORY: Past Surgical History:  Procedure Laterality Date  . ABDOMINAL HYSTERECTOMY    . DIAGNOSTIC LAPAROSCOPY    . DILATION AND CURETTAGE OF UTERUS    . GASTRIC BYPASS  2017  .  HYSTEROSCOPY W/D&C Bilateral 06/07/2013   Procedure: DILATATION AND CURETTAGE /HYSTEROSCOPY with bilateral tubal cathetrization  ;  Surgeon: Governor Specking, MD;  Location: [facility];  Service: Gynecology;  Laterality: Bilateral;  . IR IMAGING GUIDED PORT INSERTION  03/03/2019  . LAPAROSCOPY Left 06/07/2013   Procedure: LAPAROSCOPY DIAGNOSTIC with aspiration of left peritubal cyst  ;  Surgeon: Governor Specking, MD;  Location: [facility];  Service: Gynecology;  Laterality: Left;  . LAPAROTOMY Right 06/07/2013   Procedure: LAPAROTOMY with bilateral corneal segmental salpingectomy right corneal anastamosis;  Surgeon: Governor Specking, MD;  Location: [facility];  Service: Gynecology;  Laterality: Right;    FAMILY HISTORY: Family History  Problem Relation Age of Onset  . Ovarian cancer Mother 64  . Diabetes Paternal Grandmother   . Cancer Father        unknown type - possibly skin  . HIV Father   . Heart Problems Maternal Grandfather   . Cancer Paternal Grandfather        unknown type, diagnosed older than 22  . Cancer Maternal Uncle        unknown type, diagnosed older than 30  . Throat cancer Maternal Uncle        diagnosed older than 48s, smoker  . Cervical cancer Cousin        paternal cousin  . Breast cancer Neg Hx   . Colon cancer Neg Hx     SOCIAL HISTORY: Social History   Socioeconomic History  . Marital status: Married    Spouse name: Izora Gala  . Number of children: 1  . Years of education: college  . Highest education level: Not on file  Occupational History  . Occupation: receptionist  Social Needs  . Financial resource strain: Not on file  . Food insecurity    Worry: Not on file    Inability: Not on file  . Transportation needs    Medical: Not on file    Non-medical: Not on file  Tobacco Use  . Smoking status: Never Smoker  . Smokeless tobacco: Never Used  Substance and Sexual Activity  . Alcohol use: No  . Drug use: No  . Sexual activity: Yes    Birth  control/protection: None  Lifestyle  . Physical activity    Days per week: Not on file    Minutes per session: Not on file  . Stress: Not on file  Relationships  . Social Herbalist on phone: Not on file    Gets together: Not on file    Attends religious service: Not on file    Active member of club or organization: Not on file    Attends meetings of clubs or organizations: Not on file    Relationship status: Not on file  . Intimate partner violence    Fear of current or ex partner: Not on file    Emotionally abused: Not on file    Physically abused: Not on file    Forced sexual activity: Not on file  Other Topics Concern  . Not on file  Social History Narrative   Lives at home with her husband.   Right-handed.  No daily caffeine use.     PHYSICAL EXAM   Vitals:   03/29/19 0728  BP: 123/74  Pulse: 81  Temp: 97.8 F (36.6 C)  Weight: 220 lb (99.8 kg)  Height: 5\' 2"  (1.575 m)    Not recorded      Body mass index is 40.24 kg/m.  PHYSICAL EXAMNIATION:  Gen: NAD, conversant, well nourised, well groomed                     Cardiovascular: Regular rate rhythm, no peripheral edema, warm, nontender. Eyes: Conjunctivae clear without exudates or hemorrhage Neck: Supple, no carotid bruits. Pulmonary: Clear to auscultation bilaterally   NEUROLOGICAL EXAM:  MENTAL STATUS: Speech:    Speech is normal; fluent and spontaneous with normal comprehension.  Cognition:     Orientation to time, place and person     Normal recent and remote memory     Normal Attention span and concentration     Normal Language, naming, repeating,spontaneous speech     Fund of knowledge   CRANIAL NERVES: CN II: Visual fields are full to confrontation.  Pupils are round equal and briskly reactive to light. CN III, IV, VI: extraocular movement are normal. No ptosis. CN V: Facial sensation is intact to pinprick in all 3 divisions bilaterally. Corneal responses are intact.  CN VII:  Face is symmetric with normal eye closure and smile. CN VIII: Hearing is normal to causal conversation. CN IX, X: Palate elevates symmetrically. Phonation is normal. CN XI: Head turning and shoulder shrug are intact CN XII: Tongue is midline with normal movements and no atrophy.  MOTOR: There is no pronator drift of out-stretched arms. Muscle bulk and tone are normal. Muscle strength is normal.  REFLEXES: Reflexes are 1 and symmetric at the biceps, triceps, knees, and ankles. Plantar responses are flexor.  SENSORY: Mildly length dependent decreased light touch pinprick to palm level, decreased light touch, pinprick, vibratory sensation to ankle level  COORDINATION: Rapid alternating movements and fine finger movements are intact. There is no dysmetria on finger-to-nose and heel-knee-shin.    GAIT/STANCE: Posture is normal. Gait is steady with normal steps, base, arm swing, and turning. Heel and toe walking are normal.   DIAGNOSTIC DATA (LABS, IMAGING, TESTING) - I reviewed patient records, labs, notes, testing and imaging myself where available.   ASSESSMENT AND PLAN  Lekita Birt is a 43 y.o. 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,  Most likely related to the side effect of chemotherapy treatment  History of bariatric surgery, laboratory evaluation to rule out treatable etiology, such as nutritional deficiency  Keep gabapentin 300 mg at nighttime, add on 100 mg as needed during the day  Neck pain, radiating pain to bilateral shoulder and upper extremity  MRI of cervical spine to rule out cervical radiculopathy   Marcial Pacas, M.D. Ph.D.  [facility] [address], [place], [place] [address] Ph: [phone] Fax: [phone]  CC: Daryll Brod, MD [place]

## 2019-03-29 NOTE — Assessment & Plan Note (Signed)
This is less compared to prior visit Continue to observe closely

## 2019-03-29 NOTE — Assessment & Plan Note (Signed)
She has minimum bone pain with treatment Observe only for now She has pain medicine to take as needed

## 2019-03-29 NOTE — Progress Notes (Signed)
[facility] OFFICE PROGRESS NOTE  Patient Care Team: [place]. 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([place]) Overall, she tolerated cycle 2 of treatment better without major side effects She has minimum sleep disruption and very mild peripheral neuropathy that does not bother her She denies bone pain I will see her again in 2-1/2 weeks before cycle 3 of therapy I have addressed all her questions and concerns  Insomnia disorder This is less compared to prior visit Continue to observe closely  Bone pain She has minimum bone pain with treatment Observe only for now She has pain medicine to take as needed   No orders of the defined types were placed in this encounter.   INTERVAL HISTORY: Please see below for problem oriented charting. She returns for her husband for further follow-up after cycle 2 of treatment She tolerated this cycle better She denies bone pain She has minimum sleep description Very mild trace neuropathy at the tips of fingers and toes No nausea or changes in bowel habits Her energy level is fair Her husband have general questions about her diet  SUMMARY OF ONCOLOGIC HISTORY: Oncology History  Primary peritoneal adenocarcinoma ([place])  11/24/2018 Imaging   CT chest with IV contrast elsewhere Stable small lung nodules, the largest is in the left lower lobe measuring 6 mm. These are unchanged from November 2017 and are benign in nature. No new nodules.    01/03/2019 Imaging   MRI pelvis Uterus: The uterus measures 5.8 x 9.3 x 5.8 cm. Unremarkable ENDOMETRIUM. Multiple uterine body intramural fibroids measuring up to 14 mm in size.  Mild endometrial fluid.   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 - Carcinoma  appears to be arising in adnexal soft tissue, considered most consistent with stage pT2 (see comment) - Ovary with physiologic changes and and fallopian tube with paratubal cyst and surface adhesions; no definite parenchymal involvement by carcinoma identified - See synoptic report and comment  B: Lymph nodes, right common iliac, lymphadenectomy - Seven lymph nodes with no metastatic carcinoma identified (0/7)  C: Uterus with cervix and left ovary and fallopian tube, hysterectomy and left salpingo-oophorectomy Cervix: Ectocervix and endocervix with no dysplasia or malignancy identified Endometrium: Endometrium with hemorrhage and weakly proliferative features; no hyperplasia, atypia, or malignancy identified Myometrium: Adenomyosis and benign leiomyomata measuring up to 1.1 cm Ovary, left: Hemorrhagic follicular cyst, small serous inclusions, and physiologic changes Fallopian tube, left: Paratubal cysts and no malignancy identified  D: Lymph nodes, right pelvic, lymphadenectomy - Nine lymph nodes with no metastatic carcinoma identified (0/9)  E: Lymph nodes, left pelvic, lymphadenectomy - Three lymph nodes with no metastatic carcinoma identified (0/3)  F: Peritoneum, left abdominal, biopsy - Fibroadipose tissue with no malignancy identified  G: Peritoneum, right pelvic, biopsy - Fibroadipose tissue with no malignancy identified  H: Peritoneum, right abdominal, biopsy - Fibroadipose tissue with no malignancy identified  I: Peritoneum, left pelvic, biopsy - Fibroadipose tissue with no malignancy identified  J: Peritoneum, posterior cul de sac, biopsy - Fibroadipose tissue with calcifications and no malignancy identified  K: Peritoneum, anterior cul de sac, biopsy - Fibroadipose tissue with no malignancy identified  L: Omentum, omentectomy - Adipose and fibrovascular tissue consistent with omentum  - No malignancy identified    02/08/2019 Surgery   Preoperative Diagnoses:  Adnexal  mass, Obesity BMI 40  Postoperative Diagnoses: Right adnexal mass, fallopian tube adenocarcinoma; Obesity BMI 40  Procedures: Diagnostic laparoscopy, Robotic-assisted total laparoscopic hysterectomy, bilateral salpingo-oophorectomy, bilateral pelvic lymphadenectomy, right common iliac lymphadenectomy, peritoneal biopsies, minilaparotomy for infragastric ometectomy  Surgeon: Janie Morning, MD, PhD  Findings: Normal upper abdominal survey: normal liver surface, diaphragm and stomach, normal appearing small and large bowel, normal omentum. No evidence of peritoneal disease or carcinomatosis. Small amount of ascites in the posterior cul-de-sac upon entry. Enlarged right adnexal mass (~8cm), appears to be arising from the fallopian tube with an otherwise normal appearing ovary. There was no intraoperative, intraperitoneal rupture of the mass; controlled removal of the mass occurred extraperitoneally in a bag. Normal appearing uterus and left adnexa. Normal appearing omentum. Patient with a short mesentery precluding adequate visualization of the aorta. Small nodules in the posterior cul-de-sac, biopsied; no additional peritoneal disease. R0 resection.    02/20/2019 Cancer Staging   Staging form: Ovary, Fallopian Tube, and Primary Peritoneal Carcinoma, AJCC 8th Edition - Pathologic: Stage II (pT2, pN0, cM0) - Signed by Heath Lark, MD on 02/20/2019   02/28/2019 Tumor Marker   Patient's tumor was tested for the following markers: CA-125 Results of the tumor marker test revealed 26.8.   03/03/2019 Procedure   Successful placement of a right IJ approach Power Port with ultrasound and fluoroscopic guidance. The catheter is ready for use.   03/06/2019 -  Chemotherapy   The patient had carboplatin and taxol for chemotherapy treatment.     03/27/2019 Tumor Marker   Patient's tumor was tested for the following markers: CA-125. Results of the tumor marker test revealed 8.8     REVIEW OF  SYSTEMS:   Constitutional: Denies fevers, chills or abnormal weight loss Eyes: Denies blurriness of vision Ears, nose, mouth, throat, and face: Denies mucositis or sore throat Respiratory: Denies cough, dyspnea or wheezes Cardiovascular: Denies palpitation, chest discomfort or lower extremity swelling Gastrointestinal:  Denies nausea, heartburn or change in bowel habits Skin: Denies abnormal skin rashes Lymphatics: Denies new lymphadenopathy or easy bruising Behavioral/Psych: Mood is stable, no new changes  All other systems were reviewed with the patient and are negative.  I have reviewed the past medical history, past surgical history, social history and family history with the patient and they are unchanged from previous note.  ALLERGIES:  has No Known Allergies.  MEDICATIONS:  Current Outpatient Medications  Medication Sig Dispense Refill  . acetaminophen (TYLENOL) 325 MG tablet Take 650 mg by mouth every 6 (six) hours as needed.    Marland Kitchen dexamethasone (DECADRON) 4 MG tablet Take 2 tabs at the night before and 2 tab the morning of chemotherapy, every 3 weeks, by mouth 24 tablet 9  . gabapentin (NEURONTIN) 100 MG capsule Take 1 capsule (100 mg total) by mouth 3 (three) times daily. 90 capsule 11  . gabapentin (NEURONTIN) 300 MG capsule Take 1 capsule (300 mg total) by mouth 3 (three) times daily as needed. 90 capsule 0  . ibuprofen (ADVIL) 600 MG tablet Take 600 mg by mouth every 6 (six) hours as needed.    . lidocaine-prilocaine (EMLA) cream Apply to affected area once 30 g 3  . ondansetron (ZOFRAN) 8 MG tablet Take 1 tablet (8 mg total) by mouth every 8 (eight) hours as needed for refractory nausea / vomiting. Start on day 3 after carboplatin chemo. 30 tablet 1  . prochlorperazine (COMPAZINE) 10 MG tablet Take 1 tablet (10 mg total) by mouth every 6 (six) hours as needed (  Nausea or vomiting). 30 tablet 1   No current facility-administered medications for this visit.     PHYSICAL  EXAMINATION: ECOG PERFORMANCE STATUS: 1 - Symptomatic but completely ambulatory  Vitals:   03/29/19 0923  BP: (!) 147/79  Pulse: 78  Resp: 18  Temp: 98.2 F (36.8 C)  SpO2: 100%   Filed Weights   03/29/19 0923  Weight: 219 lb 12.8 oz (99.7 kg)    GENERAL:alert, no distress and comfortable SKIN: skin color, texture, turgor are normal, no rashes or significant lesions EYES: normal, Conjunctiva are pink and non-injected, sclera clear OROPHARYNX:no exudate, no erythema and lips, buccal mucosa, and tongue normal  NECK: supple, thyroid normal size, non-tender, without nodularity LYMPH:  no palpable lymphadenopathy in the cervical, axillary or inguinal LUNGS: clear to auscultation and percussion with normal breathing effort HEART: regular rate & rhythm and no murmurs and no lower extremity edema ABDOMEN:abdomen soft, non-tender and normal bowel sounds Musculoskeletal:no cyanosis of digits and no clubbing  NEURO: alert & oriented x 3 with fluent speech, no focal motor/sensory deficits  LABORATORY DATA:  I have reviewed the data as listed    Component Value Date/Time   NA 141 03/27/2019 0939   K 3.9 03/27/2019 0939   CL 106 03/27/2019 0939   CO2 23 03/27/2019 0939   GLUCOSE 142 (H) 03/27/2019 0939   BUN 15 03/27/2019 0939   CREATININE 0.82 03/27/2019 0939   CALCIUM 9.4 03/27/2019 0939   PROT 7.7 03/27/2019 0939   ALBUMIN 3.7 03/27/2019 0939   AST 16 03/27/2019 0939   ALT 19 03/27/2019 0939   ALKPHOS 87 03/27/2019 0939   BILITOT <0.2 (L) 03/27/2019 0939   GFRNONAA >60 03/27/2019 0939   GFRAA >60 03/27/2019 0939    No results found for: SPEP, UPEP  Lab Results  Component Value Date   WBC 12.6 (H) 03/27/2019   NEUTROABS 11.1 (H) 03/27/2019   HGB 11.6 (L) 03/27/2019   HCT 35.2 (L) 03/27/2019   MCV 84.2 03/27/2019   PLT 293 03/27/2019      Chemistry      Component Value Date/Time   NA 141 03/27/2019 0939   K 3.9 03/27/2019 0939   CL 106 03/27/2019 0939   CO2 23  03/27/2019 0939   BUN 15 03/27/2019 0939   CREATININE 0.82 03/27/2019 0939      Component Value Date/Time   CALCIUM 9.4 03/27/2019 0939   ALKPHOS 87 03/27/2019 0939   AST 16 03/27/2019 0939   ALT 19 03/27/2019 0939   BILITOT <0.2 (L) 03/27/2019 0939       RADIOGRAPHIC STUDIES: I have personally reviewed the radiological images as listed and agreed with the findings in the report. Ir Imaging Guided Port Insertion  Result Date: 03/03/2019 INDICATION: 43 year old female with peritoneal adenocarcinoma. She presents for port catheter placement. EXAM: IMPLANTED PORT A CATH PLACEMENT WITH ULTRASOUND AND FLUOROSCOPIC GUIDANCE MEDICATIONS: 2 g Ancef; The antibiotic was administered within an appropriate time interval prior to skin puncture. ANESTHESIA/SEDATION: Versed 3 mg IV; Fentanyl 100 mcg IV; Moderate Sedation Time:  24 minutes The patient was continuously monitored during the procedure by the interventional radiology nurse under my direct supervision. FLUOROSCOPY TIME:  0 minutes, 18 seconds (12 mGy) COMPLICATIONS: None immediate. PROCEDURE: The right neck and chest was prepped with chlorhexidine, and draped in the usual sterile fashion using maximum barrier technique (cap and mask, sterile gown, sterile gloves, large sterile sheet, hand hygiene and cutaneous antiseptic). Local anesthesia was attained by infiltration with 1%  lidocaine with epinephrine. Ultrasound demonstrated patency of the right internal jugular vein, and this was documented with an image. Under real-time ultrasound guidance, this vein was accessed with a 21 gauge micropuncture needle and image documentation was performed. A small dermatotomy was made at the access site with an 11 scalpel. A 0.018" wire was advanced into the SVC and the access needle exchanged for a 68F micropuncture vascular sheath. The 0.018" wire was then removed and a 0.035" wire advanced into the IVC. An appropriate location for the subcutaneous reservoir was  selected below the clavicle and an incision was made through the skin and underlying soft tissues. The subcutaneous tissues were then dissected using a combination of blunt and sharp surgical technique and a pocket was formed. A single lumen power injectable portacatheter was then tunneled through the subcutaneous tissues from the pocket to the dermatotomy and the port reservoir placed within the subcutaneous pocket. The venous access site was then serially dilated and a peel away vascular sheath placed over the wire. The wire was removed and the port catheter advanced into position under fluoroscopic guidance. The catheter tip is positioned in the superior cavoatrial junction. This was documented with a spot image. The portacatheter was then tested and found to flush and aspirate well. The port was flushed with saline followed by 100 units/mL heparinized saline. The pocket was then closed in two layers using first subdermal inverted interrupted absorbable sutures followed by a running subcuticular suture. The epidermis was then sealed with Dermabond. The dermatotomy at the venous access site was also closed with Dermabond. IMPRESSION: Successful placement of a right IJ approach Power Port with ultrasound and fluoroscopic guidance. The catheter is ready for use. Electronically Signed   By: Jacqulynn Cadet M.D.   On: 03/03/2019 17:47    All questions were answered. The patient knows to call the clinic with any problems, questions or concerns. No barriers to learning was detected.  I spent 10 minutes counseling the patient face to face. The total time spent in the appointment was 15 minutes and more than 50% was on counseling and review of test results  Heath Lark, MD 03/29/2019 9:45 AM

## 2019-03-29 NOTE — Assessment & Plan Note (Signed)
Overall, she tolerated cycle 2 of treatment better without major side effects She has minimum sleep disruption and very mild peripheral neuropathy that does not bother her She denies bone pain I will see her again in 2-1/2 weeks before cycle 3 of therapy I have addressed all her questions and concerns

## 2019-03-30 ENCOUNTER — Telehealth: Payer: Self-pay

## 2019-03-30 ENCOUNTER — Telehealth: Payer: Self-pay | Admitting: Neurology

## 2019-03-30 DIAGNOSIS — C482 Malignant neoplasm of peritoneum, unspecified: Secondary | ICD-10-CM

## 2019-03-30 MED ORDER — DICLOFENAC SODIUM 1 % EX CREA: 4.0000 g | TOPICAL_CREAM | CUTANEOUS | 11 refills | Status: DC | PRN

## 2019-03-30 NOTE — Telephone Encounter (Signed)
Pt aware diclofenac cream has been sent to [facility].  EMLA cream sent already by other MD.  Pt's husband aware and will pick up the medication.

## 2019-03-30 NOTE — Telephone Encounter (Signed)
Pt's husband called stating that the pt was to get a cream prescribed to her yesterday at her appt and the pharmacy has not received it yet. Please advise.

## 2019-03-30 NOTE — Telephone Encounter (Signed)
Patient sent to GI via email, [facility], they will obain. DW

## 2019-03-30 NOTE — Telephone Encounter (Signed)
Please advise patient, holding diclofenac and EMLA gel to her pharmacy

## 2019-03-31 ENCOUNTER — Other Ambulatory Visit: Payer: Self-pay | Admitting: *Deleted

## 2019-03-31 ENCOUNTER — Other Ambulatory Visit: Payer: Self-pay | Admitting: Neurology

## 2019-03-31 MED ORDER — DICLOFENAC SODIUM 1 % EX CREA: 4.0000 g | TOPICAL_CREAM | Freq: Four times a day (QID) | CUTANEOUS | 11 refills | Status: DC | PRN

## 2019-03-31 MED ORDER — LIDOCAINE-PRILOCAINE 2.5-2.5 % EX CREA: TOPICAL_CREAM | CUTANEOUS | 3 refills | Status: DC

## 2019-03-31 NOTE — Telephone Encounter (Signed)
Pt's husband called again and states the pharmacy is informing him that the directions for the cream is still pending and they are needing the directions before they can give him the pt's cream. He states he called till they closed and they still have not received the directions. Please advise.

## 2019-03-31 NOTE — Telephone Encounter (Signed)
Pt's husband called again stating that the pharmacy states that the insurance is needing to know the frequency stated on the prescription before they release the cream to the pt. Pt would like to know if this prescription can be switched to [facility] because [facility] is giving them a [facility] of problems and [facility] are [facility]. Please advise.

## 2019-03-31 NOTE — Telephone Encounter (Signed)
I changed the cream Rx to 4 times a day prn

## 2019-03-31 NOTE — Addendum Note (Signed)
Addended by: Marcial Pacas on: 03/31/2019 11:30 AM   Modules accepted: Orders

## 2019-04-03 ENCOUNTER — Other Ambulatory Visit: Payer: Self-pay | Admitting: *Deleted

## 2019-04-03 MED ORDER — DICLOFENAC SODIUM 1 % EX GEL: 4.0000 g | Freq: Four times a day (QID) | CUTANEOUS | 5 refills | Status: DC | PRN

## 2019-04-05 ENCOUNTER — Telehealth: Payer: Self-pay | Admitting: Neurology

## 2019-04-05 LAB — IRON AND TIBC
Iron Saturation: 53 % (ref 15–55)
Iron: 134 ug/dL (ref 27–159)
Total Iron Binding Capacity: 252 ug/dL (ref 250–450)
UIBC: 118 ug/dL — ABNORMAL LOW (ref 131–425)

## 2019-04-05 LAB — COPPER, SERUM: Copper: 119 ug/dL (ref 72–166)

## 2019-04-05 LAB — VITAMIN E
Vitamin E (Alpha Tocopherol): 11.6 mg/L (ref 7.0–25.1)
Vitamin E(Gamma Tocopherol): 2.1 mg/L (ref 0.5–5.5)

## 2019-04-05 LAB — RPR: RPR Ser Ql: NONREACTIVE

## 2019-04-05 LAB — VITAMIN B1: Thiamine: 127.7 nmol/L (ref 66.5–200.0)

## 2019-04-05 LAB — FERRITIN: Ferritin: 145 ng/mL (ref 15–150)

## 2019-04-05 LAB — TSH: TSH: 4.27 u[IU]/mL (ref 0.450–4.500)

## 2019-04-05 LAB — HGB A1C W/O EAG: Hgb A1c MFr Bld: 5.7 % — ABNORMAL HIGH (ref 4.8–5.6)

## 2019-04-05 LAB — VITAMIN D 25 HYDROXY (VIT D DEFICIENCY, FRACTURES): Vit D, 25-Hydroxy: 12.7 ng/mL — ABNORMAL LOW (ref 30.0–100.0)

## 2019-04-05 LAB — CK: Total CK: 53 U/L (ref 32–182)

## 2019-04-05 LAB — VITAMIN B12: Vitamin B-12: 247 pg/mL (ref 232–1245)

## 2019-04-05 NOTE — Telephone Encounter (Signed)
Please call patient, laboratory evaluation showed vitamin D deficiency, with a level of 12.7 ng/ mL, she would benefit over-the-counter vitamin D3 supplement 1000 units 2 tablets daily.  Rest of the laboratory evaluations showed no significant abnormalities.

## 2019-04-05 NOTE — Telephone Encounter (Signed)
Left patient a detailed message, with results, on voicemail (ok per DPR).  Provided our number to call back with any questions.  

## 2019-04-10 MED FILL — PROCHLORPERAZINE 10 MG TAB: 10 | 30 days supply | Qty: 30 | Fill #1

## 2019-04-10 MED FILL — ONDANSETRON HCL 8 MG TABLET: 8 | 21 days supply | Qty: 18 | Fill #1

## 2019-04-10 MED FILL — DEXAMETHASONE 4 MG TABLET: 4 | 21 days supply | Qty: 4 | Fill #2

## 2019-04-17 ENCOUNTER — Inpatient Hospital Stay: Payer: Managed Care, Other (non HMO)

## 2019-04-17 ENCOUNTER — Telehealth: Payer: Self-pay | Admitting: Hematology and Oncology

## 2019-04-17 ENCOUNTER — Encounter: Payer: Self-pay | Admitting: Hematology and Oncology

## 2019-04-17 ENCOUNTER — Other Ambulatory Visit: Payer: Self-pay

## 2019-04-17 ENCOUNTER — Inpatient Hospital Stay (HOSPITAL_BASED_OUTPATIENT_CLINIC_OR_DEPARTMENT_OTHER): Payer: Managed Care, Other (non HMO) | Admitting: Hematology and Oncology

## 2019-04-17 ENCOUNTER — Other Ambulatory Visit: Payer: Self-pay | Admitting: Hematology and Oncology

## 2019-04-17 DIAGNOSIS — T451X5A Adverse effect of antineoplastic and immunosuppressive drugs, initial encounter: Secondary | ICD-10-CM

## 2019-04-17 DIAGNOSIS — C482 Malignant neoplasm of peritoneum, unspecified: Secondary | ICD-10-CM

## 2019-04-17 DIAGNOSIS — M898X9 Other specified disorders of bone, unspecified site: Secondary | ICD-10-CM | POA: Diagnosis not present

## 2019-04-17 DIAGNOSIS — G620 Drug-induced polyneuropathy: Secondary | ICD-10-CM | POA: Diagnosis not present

## 2019-04-17 DIAGNOSIS — Z5111 Encounter for antineoplastic chemotherapy: Secondary | ICD-10-CM | POA: Diagnosis not present

## 2019-04-17 LAB — CMP (CANCER CENTER ONLY)
ALT: 29 U/L (ref 0–44)
AST: 24 U/L (ref 15–41)
Albumin: 3.7 g/dL (ref 3.5–5.0)
Alkaline Phosphatase: 86 U/L (ref 38–126)
Anion gap: 12 (ref 5–15)
BUN: 15 mg/dL (ref 6–20)
CO2: 24 mmol/L (ref 22–32)
Calcium: 9.7 mg/dL (ref 8.9–10.3)
Chloride: 105 mmol/L (ref 98–111)
Creatinine: 0.84 mg/dL (ref 0.44–1.00)
GFR, Est AFR Am: >60 mL/min (ref >60)
GFR, Estimated: >60 mL/min (ref >60)
Glucose, Bld: 149 mg/dL — ABNORMAL HIGH (ref 70–99)
Potassium: 4 mmol/L (ref 3.5–5.1)
Sodium: 141 mmol/L (ref 135–145)
Total Bilirubin: 0.2 mg/dL — ABNORMAL LOW (ref 0.3–1.2)
Total Protein: 7.9 g/dL (ref 6.5–8.1)

## 2019-04-17 LAB — CBC WITH DIFFERENTIAL (CANCER CENTER ONLY)
Abs Immature Granulocytes: 0.12 10*3/uL — ABNORMAL HIGH (ref 0.00–0.07)
Basophils Absolute: 0 10*3/uL (ref 0.0–0.1)
Basophils Relative: 0 %
Eosinophils Absolute: 0 10*3/uL (ref 0.0–0.5)
Eosinophils Relative: 0 %
HCT: 35.9 % — ABNORMAL LOW (ref 36.0–46.0)
Hemoglobin: 11.6 g/dL — ABNORMAL LOW (ref 12.0–15.0)
Immature Granulocytes: 1 %
Lymphocytes Relative: 9 %
Lymphs Abs: 0.9 10*3/uL (ref 0.7–4.0)
MCH: 27.4 pg (ref 26.0–34.0)
MCHC: 32.3 g/dL (ref 30.0–36.0)
MCV: 84.9 fL (ref 80.0–100.0)
Monocytes Absolute: 0.2 10*3/uL (ref 0.1–1.0)
Monocytes Relative: 2 %
Neutro Abs: 9 10*3/uL — ABNORMAL HIGH (ref 1.7–7.7)
Neutrophils Relative %: 88 %
Platelet Count: 272 10*3/uL (ref 150–400)
RBC: 4.23 MIL/uL (ref 3.87–5.11)
RDW: 12.3 % (ref 11.5–15.5)
WBC Count: 10.2 10*3/uL (ref 4.0–10.5)
nRBC: 0 % (ref 0.0–0.2)

## 2019-04-17 MED ORDER — SODIUM CHLORIDE 0.9 % IV SOLN
Freq: Once | INTRAVENOUS | Status: AC
  Administered 2019-04-17: 11:00:00 via INTRAVENOUS
  Filled 2019-04-17: qty 5

## 2019-04-17 MED ORDER — FAMOTIDINE IN NACL 20-0.9 MG/50ML-% IV SOLN
INTRAVENOUS | Status: AC
  Filled 2019-04-17: qty 50

## 2019-04-17 MED ORDER — DIPHENHYDRAMINE HCL 50 MG/ML IJ SOLN
INTRAMUSCULAR | Status: AC
  Filled 2019-04-17: qty 1

## 2019-04-17 MED ORDER — PALONOSETRON HCL INJECTION 0.25 MG/5ML
0.2500 mg | Freq: Once | INTRAVENOUS | Status: AC
  Administered 2019-04-17: 0.25 mg via INTRAVENOUS

## 2019-04-17 MED ORDER — FAMOTIDINE IN NACL 20-0.9 MG/50ML-% IV SOLN
20.0000 mg | Freq: Once | INTRAVENOUS | Status: AC
  Administered 2019-04-17: 20 mg via INTRAVENOUS

## 2019-04-17 MED ORDER — SODIUM CHLORIDE 0.9 % IV SOLN
750.0000 mg | Freq: Once | INTRAVENOUS | Status: AC
  Administered 2019-04-17: 750 mg via INTRAVENOUS
  Filled 2019-04-17: qty 75

## 2019-04-17 MED ORDER — PALONOSETRON HCL INJECTION 0.25 MG/5ML
INTRAVENOUS | Status: AC
  Filled 2019-04-17: qty 5

## 2019-04-17 MED ORDER — HEPARIN SOD (PORK) LOCK FLUSH 100 UNIT/ML IV SOLN
500.0000 [IU] | Freq: Once | INTRAVENOUS | Status: AC | PRN
  Administered 2019-04-17: 500 [IU]
  Filled 2019-04-17: qty 5

## 2019-04-17 MED ORDER — SODIUM CHLORIDE 0.9% FLUSH
10.0000 mL | INTRAVENOUS | Status: DC | PRN
  Administered 2019-04-17: 10 mL
  Filled 2019-04-17: qty 10

## 2019-04-17 MED ORDER — DIPHENHYDRAMINE HCL 50 MG/ML IJ SOLN
50.0000 mg | Freq: Once | INTRAMUSCULAR | Status: AC
  Administered 2019-04-17: 50 mg via INTRAVENOUS

## 2019-04-17 MED ORDER — SODIUM CHLORIDE 0.9 % IV SOLN
140.0000 mg/m2 | Freq: Once | INTRAVENOUS | Status: AC
  Administered 2019-04-17: 294 mg via INTRAVENOUS
  Filled 2019-04-17: qty 49

## 2019-04-17 MED ORDER — SODIUM CHLORIDE 0.9% FLUSH
10.0000 mL | Freq: Once | INTRAVENOUS | Status: AC
  Administered 2019-04-17: 10 mL
  Filled 2019-04-17: qty 10

## 2019-04-17 MED ORDER — SODIUM CHLORIDE 0.9 % IV SOLN
Freq: Once | INTRAVENOUS | Status: AC
  Administered 2019-04-17: 10:00:00 via INTRAVENOUS
  Filled 2019-04-17: qty 250

## 2019-04-17 NOTE — Telephone Encounter (Signed)
Confirmed December, January and February appointments with patient.

## 2019-04-17 NOTE — Assessment & Plan Note (Signed)
She tolerated recent chemotherapy better She still have bone pain, insomnia and neuropathy from treatment I plan to adjust the dose of Taxol a little bit I explained to her why she is not able to work until she has completed her chemotherapy I spent some time completing her disability paperwork today

## 2019-04-17 NOTE — Assessment & Plan Note (Signed)
she has mild peripheral neuropathy, likely related to side effects of treatment. °I plan to reduce the dose of treatment as outlined above.  °I explained to the patient the rationale of this strategy and reassured the patient it would not compromise the efficacy of treatment ° °

## 2019-04-17 NOTE — Patient Instructions (Signed)
[facility] Discharge Instructions for Patients Receiving Chemotherapy  Today you received the following chemotherapy agents: Paclitaxel (Taxol) and Carboplatin (Paraplatin)  To help prevent nausea and vomiting after your treatment, we encourage you to take your nausea medication as directed. Received Aloxi during treatment today-->For the next 3 days take your Compazine prescription (not your Zofran) as needed for break through nausea. You can add the Zofran into you regimen starting Thursday as needed.   If you develop nausea and vomiting that is not controlled by your nausea medication, call the clinic.   BELOW ARE SYMPTOMS THAT SHOULD BE REPORTED IMMEDIATELY:  *FEVER GREATER THAN 100.5 F  *CHILLS WITH OR WITHOUT FEVER  NAUSEA AND VOMITING THAT IS NOT CONTROLLED WITH YOUR NAUSEA MEDICATION  *UNUSUAL SHORTNESS OF BREATH  *UNUSUAL BRUISING OR BLEEDING  TENDERNESS IN MOUTH AND THROAT WITH OR WITHOUT PRESENCE OF ULCERS  *URINARY PROBLEMS  *BOWEL PROBLEMS  UNUSUAL RASH Items with * indicate a potential emergency and should be followed up as soon as possible.  Feel free to call the clinic should you have any questions or concerns. The clinic phone number is [phone].  Please show the [place] at check-in to the Emergency Department and triage nurse.

## 2019-04-17 NOTE — Progress Notes (Signed)
[place] OFFICE PROGRESS NOTE  Patient Care Team: [place]. 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 I plan to reduce the dose of treatment as outlined above.  I explained to the patient the rationale of this strategy and reassured the patient it would not compromise the efficacy of treatment   Bone pain She has minimum bone pain with treatment Observe only for now She has pain medicine to take as needed   No orders of the defined types were placed in this encounter.   INTERVAL HISTORY: Please see below for problem oriented charting. She returns for further follow-up She tolerated last cycle of therapy well but still have persistent bone pain, insomnia and she had persistent neuropathy She can only tolerate low-dose gabapentin without feeling excessively sedated No recent infection, fever or chills She is here accompanied by her mother-in-law Denies recent nausea vomiting  SUMMARY OF ONCOLOGIC HISTORY: Oncology History  Primary peritoneal adenocarcinoma ([place])  11/24/2018 Imaging   CT chest with IV contrast elsewhere Stable small lung nodules, the largest is in the left lower lobe measuring 6 mm. These are unchanged from November 2017 and are benign in nature. No new nodules.    01/03/2019 Imaging   MRI pelvis Uterus: The uterus measures 5.8 x 9.3 x 5.8 cm. Unremarkable ENDOMETRIUM. Multiple uterine body intramural  fibroids measuring up to 14 mm in size.  Mild endometrial fluid.   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 - Carcinoma appears to be arising in adnexal soft tissue, considered most consistent with stage pT2 (see comment) - Ovary with physiologic changes and and fallopian tube with paratubal cyst and surface adhesions; no definite parenchymal involvement by carcinoma identified - See synoptic report and comment  B: Lymph nodes, right common iliac, lymphadenectomy - Seven lymph nodes with no metastatic carcinoma identified (0/7)  C: Uterus with cervix and left ovary and fallopian tube, hysterectomy and left salpingo-oophorectomy Cervix: Ectocervix and endocervix with no dysplasia or malignancy identified Endometrium: Endometrium with hemorrhage and weakly proliferative features; no hyperplasia, atypia, or malignancy identified Myometrium: Adenomyosis and benign leiomyomata measuring up to 1.1 cm Ovary, left: Hemorrhagic follicular cyst, small serous inclusions, and physiologic changes Fallopian tube, left: Paratubal cysts and no malignancy identified  D: Lymph nodes, right pelvic, lymphadenectomy - Nine lymph nodes with no metastatic carcinoma identified (0/9)  E: Lymph nodes, left pelvic, lymphadenectomy - Three lymph nodes with no metastatic carcinoma identified (0/3)  F: Peritoneum, left abdominal, biopsy - Fibroadipose tissue with no malignancy identified  G: Peritoneum, right pelvic, biopsy - Fibroadipose tissue with no malignancy identified  H: Peritoneum, right abdominal, biopsy - Fibroadipose tissue with no malignancy identified  I: Peritoneum, left pelvic, biopsy - Fibroadipose tissue with no malignancy identified  J: Peritoneum, posterior cul de sac, biopsy - Fibroadipose tissue with calcifications and no malignancy identified  K: Peritoneum,  anterior cul de sac, biopsy -  Fibroadipose tissue with no malignancy identified  L: Omentum, omentectomy - Adipose and fibrovascular tissue consistent with omentum  - No malignancy identified    02/08/2019 Surgery   Preoperative Diagnoses: Adnexal mass, Obesity BMI 40  Postoperative Diagnoses: Right adnexal mass, fallopian tube adenocarcinoma; Obesity BMI 40  Procedures: Diagnostic laparoscopy, Robotic-assisted total laparoscopic hysterectomy, bilateral salpingo-oophorectomy, bilateral pelvic lymphadenectomy, right common iliac lymphadenectomy, peritoneal biopsies, minilaparotomy for infragastric ometectomy  Surgeon: Janie Morning, MD, PhD  Findings: Normal upper abdominal survey: normal liver surface, diaphragm and stomach, normal appearing small and large bowel, normal omentum. No evidence of peritoneal disease or carcinomatosis. Small amount of ascites in the posterior cul-de-sac upon entry. Enlarged right adnexal mass (~8cm), appears to be arising from the fallopian tube with an otherwise normal appearing ovary. There was no intraoperative, intraperitoneal rupture of the mass; controlled removal of the mass occurred extraperitoneally in a bag. Normal appearing uterus and left adnexa. Normal appearing omentum. Patient with a short mesentery precluding adequate visualization of the aorta. Small nodules in the posterior cul-de-sac, biopsied; no additional peritoneal disease. R0 resection.    02/20/2019 Cancer Staging   Staging form: Ovary, Fallopian Tube, and Primary Peritoneal Carcinoma, AJCC 8th Edition - Pathologic: Stage II (pT2, pN0, cM0) - Signed by Heath Lark, MD on 02/20/2019   02/28/2019 Tumor Marker   Patient's tumor was tested for the following markers: CA-125 Results of the tumor marker test revealed 26.8.   03/03/2019 Procedure   Successful placement of a right IJ approach Power Port with ultrasound and fluoroscopic guidance. The catheter is ready for use.   03/06/2019 -   Chemotherapy   The patient had carboplatin and taxol for chemotherapy treatment.     03/27/2019 Tumor Marker   Patient's tumor was tested for the following markers: CA-125. Results of the tumor marker test revealed 8.8     REVIEW OF SYSTEMS:   Constitutional: Denies fevers, chills or abnormal weight loss Eyes: Denies blurriness of vision Ears, nose, mouth, throat, and face: Denies mucositis or sore throat Respiratory: Denies cough, dyspnea or wheezes Cardiovascular: Denies palpitation, chest discomfort or lower extremity swelling Gastrointestinal:  Denies nausea, heartburn or change in bowel habits Skin: Denies abnormal skin rashes Lymphatics: Denies new lymphadenopathy or easy bruising Behavioral/Psych: Mood is stable, no new changes  All other systems were reviewed with the patient and are negative.  I have reviewed the past medical history, past surgical history, social history and family history with the patient and they are unchanged from previous note.  ALLERGIES:  has No Known Allergies.  MEDICATIONS:  Current Outpatient Medications  Medication Sig Dispense Refill  . acetaminophen (TYLENOL) 325 MG tablet Take 650 mg by mouth every 6 (six) hours as needed.    Marland Kitchen dexamethasone (DECADRON) 4 MG tablet Take 2 tabs at the night before and 2 tab the morning of chemotherapy, every 3 weeks, by mouth 24 tablet 9  . diclofenac Sodium (VOLTAREN) 1 % GEL Apply 4 g topically 4 (four) times daily as needed. 100 g 5  . gabapentin (NEURONTIN) 100 MG capsule Take 1 capsule (100 mg total) by mouth 3 (three) times daily. 90 capsule 11  . ibuprofen (ADVIL) 600 MG tablet Take 600 mg by mouth every 6 (six) hours as needed.    . lidocaine-prilocaine (EMLA) cream Apply to affected area 4 times a day prn 30 g 3  . ondansetron (ZOFRAN) 8 MG tablet Take 1 tablet (8 mg total) by mouth every 8 (eight) hours as needed for refractory  nausea / vomiting. Start on day 3 after carboplatin chemo. 30 tablet 1  .  prochlorperazine (COMPAZINE) 10 MG tablet Take 1 tablet (10 mg total) by mouth every 6 (six) hours as needed (Nausea or vomiting). 30 tablet 1   No current facility-administered medications for this visit.    Facility-Administered Medications Ordered in Other Visits  Medication Dose Route Frequency Provider Last Rate Last Dose  . CARBOplatin (PARAPLATIN) 750 mg in sodium chloride 0.9 % 250 mL chemo infusion  750 mg Intravenous Once Alvy Bimler, Jatoya Armbrister, MD      . heparin lock flush 100 unit/mL  500 Units Intracatheter Once PRN Alvy Bimler, Tonni Mansour, MD      . PACLitaxel (TAXOL) 294 mg in sodium chloride 0.9 % 250 mL chemo infusion (> 80mg /m2)  140 mg/m2 (Treatment Plan Recorded) Intravenous Once Alvy Bimler, Naevia Unterreiner, MD 100 mL/hr at 04/17/19 1125 294 mg at 04/17/19 1125  . sodium chloride flush (NS) 0.9 % injection 10 mL  10 mL Intracatheter PRN Alvy Bimler, Laycee Fitzsimmons, MD        PHYSICAL EXAMINATION: ECOG PERFORMANCE STATUS: 1 - Symptomatic but completely ambulatory  Vitals:   04/17/19 0900  BP: (!) 151/95  Pulse: 94  Resp: 18  Temp: 98.5 F (36.9 C)  SpO2: 100%   Filed Weights   04/17/19 0900  Weight: 215 lb (97.5 kg)    GENERAL:alert, no distress and comfortable SKIN: skin color, texture, turgor are normal, no rashes or significant lesions EYES: normal, Conjunctiva are pink and non-injected, sclera clear OROPHARYNX:no exudate, no erythema and lips, buccal mucosa, and tongue normal  NECK: supple, thyroid normal size, non-tender, without nodularity LYMPH:  no palpable lymphadenopathy in the cervical, axillary or inguinal LUNGS: clear to auscultation and percussion with normal breathing effort HEART: regular rate & rhythm and no murmurs and no lower extremity edema ABDOMEN:abdomen soft, non-tender and normal bowel sounds Musculoskeletal:no cyanosis of digits and no clubbing  NEURO: alert & oriented x 3 with fluent speech, no focal motor/sensory deficits  LABORATORY DATA:  I have reviewed the data as listed     Component Value Date/Time   NA 141 04/17/2019 0817   K 4.0 04/17/2019 0817   CL 105 04/17/2019 0817   CO2 24 04/17/2019 0817   GLUCOSE 149 (H) 04/17/2019 0817   BUN 15 04/17/2019 0817   CREATININE 0.84 04/17/2019 0817   CALCIUM 9.7 04/17/2019 0817   PROT 7.9 04/17/2019 0817   ALBUMIN 3.7 04/17/2019 0817   AST 24 04/17/2019 0817   ALT 29 04/17/2019 0817   ALKPHOS 86 04/17/2019 0817   BILITOT 0.2 (L) 04/17/2019 0817   GFRNONAA >60 04/17/2019 0817   GFRAA >60 04/17/2019 0817    No results found for: SPEP, UPEP  Lab Results  Component Value Date   WBC 10.2 04/17/2019   NEUTROABS 9.0 (H) 04/17/2019   HGB 11.6 (L) 04/17/2019   HCT 35.9 (L) 04/17/2019   MCV 84.9 04/17/2019   PLT 272 04/17/2019      Chemistry      Component Value Date/Time   NA 141 04/17/2019 0817   K 4.0 04/17/2019 0817   CL 105 04/17/2019 0817   CO2 24 04/17/2019 0817   BUN 15 04/17/2019 0817   CREATININE 0.84 04/17/2019 0817      Component Value Date/Time   CALCIUM 9.7 04/17/2019 0817   ALKPHOS 86 04/17/2019 0817   AST 24 04/17/2019 0817   ALT 29 04/17/2019 0817   BILITOT 0.2 (L) 04/17/2019 [id number]  All questions were answered. The patient knows to call the clinic with any problems, questions or concerns. No barriers to learning was detected.  I spent 25 minutes counseling the patient face to face. The total time spent in the appointment was 30 minutes and more than 50% was on counseling and review of test results  Heath Lark, MD 04/17/2019 12:42 PM

## 2019-04-17 NOTE — Assessment & Plan Note (Signed)
She has minimum bone pain with treatment Observe only for now She has pain medicine to take as needed

## 2019-04-18 LAB — CA 125: Cancer Antigen (CA) 125: 8.5 U/mL (ref 0.0–38.1)

## 2019-04-25 DIAGNOSIS — Z1379 Encounter for other screening for genetic and chromosomal anomalies: Secondary | ICD-10-CM | POA: Insufficient documentation

## 2019-04-27 ENCOUNTER — Ambulatory Visit: Payer: Self-pay | Admitting: Genetic Counselor

## 2019-04-27 ENCOUNTER — Encounter: Payer: Self-pay | Admitting: Genetic Counselor

## 2019-04-27 ENCOUNTER — Other Ambulatory Visit: Payer: Managed Care, Other (non HMO)

## 2019-04-27 ENCOUNTER — Telehealth: Payer: Self-pay | Admitting: Genetic Counselor

## 2019-04-27 DIAGNOSIS — Z1379 Encounter for other screening for genetic and chromosomal anomalies: Secondary | ICD-10-CM

## 2019-04-27 NOTE — Progress Notes (Signed)
HPI:  Caitlin [name] was previously seen in the [facility] due to a personal and family history of ovarian cancer and concerns regarding a hereditary predisposition to cancer. Please refer to our prior cancer genetics clinic note for more information regarding our discussion, assessment and recommendations, at the time. Caitlin [name] recent genetic test results were disclosed to her, as were recommendations warranted by these results. These results and recommendations are discussed in more detail below.  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 These are unchanged from November 2017 and are benign in nature. No new nodules.    01/03/2019 Imaging   MRI pelvis Uterus: The uterus measures 5.8 x 9.3 x 5.8 cm. Unremarkable ENDOMETRIUM. Multiple uterine body intramural fibroids measuring up to 14 mm in size.  Mild endometrial fluid.   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 - Carcinoma appears to be arising in adnexal soft tissue, considered most consistent with stage pT2 (see comment) - Ovary with physiologic changes and and fallopian tube with paratubal cyst and surface adhesions; no definite parenchymal involvement by carcinoma identified - See synoptic report and comment  B: Lymph nodes, right common iliac, lymphadenectomy - Seven lymph nodes with no metastatic carcinoma identified (0/7)  C: Uterus with cervix and left ovary and fallopian tube, hysterectomy and left salpingo-oophorectomy Cervix: Ectocervix and endocervix with no dysplasia or malignancy identified Endometrium: Endometrium with hemorrhage and weakly proliferative features; no hyperplasia, atypia, or malignancy  identified Myometrium: Adenomyosis and benign leiomyomata measuring up to 1.1 cm Ovary, left: Hemorrhagic follicular cyst, small serous inclusions, and physiologic changes Fallopian tube, left: Paratubal cysts and no malignancy identified  D: Lymph nodes, right pelvic, lymphadenectomy - Nine lymph nodes with no metastatic carcinoma identified (0/9)  E: Lymph nodes, left pelvic, lymphadenectomy - Three lymph nodes with no metastatic carcinoma identified (0/3)  F: Peritoneum, left abdominal, biopsy - Fibroadipose tissue with no malignancy identified  G: Peritoneum, right pelvic, biopsy - Fibroadipose tissue with no malignancy identified  H: Peritoneum, right abdominal, biopsy - Fibroadipose tissue with no malignancy identified  I: Peritoneum, left pelvic, biopsy - Fibroadipose tissue with no malignancy identified  J: Peritoneum, posterior cul de sac, biopsy - Fibroadipose tissue with calcifications and no malignancy identified  K: Peritoneum, anterior cul de sac, biopsy - Fibroadipose tissue with no malignancy identified  L: Omentum, omentectomy - Adipose and fibrovascular tissue consistent with omentum  - No malignancy identified    02/08/2019 Surgery   Preoperative Diagnoses: Adnexal mass, Obesity BMI 40  Postoperative Diagnoses: Right adnexal mass, fallopian tube adenocarcinoma; Obesity BMI 40  Procedures: Diagnostic laparoscopy, Robotic-assisted total laparoscopic hysterectomy, bilateral salpingo-oophorectomy, bilateral pelvic lymphadenectomy, right common iliac lymphadenectomy, peritoneal biopsies, minilaparotomy for infragastric ometectomy  Surgeon: Janie Morning, MD, PhD  Findings: Normal upper abdominal survey: normal liver surface, diaphragm and stomach, normal appearing small and large bowel, normal omentum. No evidence of peritoneal disease or carcinomatosis. Small amount of ascites in the posterior cul-de-sac upon entry. Enlarged right adnexal mass (~8cm), appears  to be arising from the fallopian tube with an otherwise normal appearing ovary. There was no intraoperative, intraperitoneal rupture of the mass; controlled removal of the mass occurred extraperitoneally in a bag. Normal appearing uterus and left adnexa. Normal  appearing omentum. Patient with a short mesentery precluding adequate visualization of the aorta. Small nodules in the posterior cul-de-sac, biopsied; no additional peritoneal disease. R0 resection.    02/20/2019 Cancer Staging   Staging form: Ovary, Fallopian Tube, and Primary Peritoneal Carcinoma, AJCC 8th Edition - Pathologic: Stage II (pT2, pN0, cM0) - Signed by Heath Lark, MD on 02/20/2019   02/28/2019 Tumor Marker   Patient's tumor was tested for the following markers: CA-125 Results of the tumor marker test revealed 26.8.   03/03/2019 Procedure   Successful placement of a right IJ approach Power Port with ultrasound and fluoroscopic guidance. The catheter is ready for use.   03/06/2019 -  Chemotherapy   The patient had carboplatin and taxol for chemotherapy treatment.     03/27/2019 Tumor Marker   Patient's tumor was tested for the following markers: CA-125. Results of the tumor marker test revealed 8.8   04/17/2019 Tumor Marker   Patient's tumor was tested for the following markers: CA-125 Results of the tumor marker test revealed 8.5   04/25/2019 Genetic Testing   Negative genetic testing:  No pathogenic variants detected on the [facility] TumorNext-HRD+CancerNext panel. Testing did identify two variants of uncertain significance (VUS). One germline VUS was detected in the NBN gene, called c.812T>C (p.V271A). One somatic VUS was detected in the CHEK2 gene, called c.1116_1117delCAinsTG. The report date is 04/25/2019.  The TumorNext-HRD panel offered by [facility] includes analysis of the tumor for somatic variants in the following 11 genes:  ATM, BARD1, BRIP1, CHEK2, MRE11A, NBN, PALB2, RAD51C, RAD51D, BRCA1, BRCA2 (sequencing  only). The CancerNext gene panel offered by [employer] includes sequencing and rearrangement analysis of the blood for germline variants in the following 36 genes:   APC, ATM, AXIN2, BARD1, BMPR1A, BRCA1, BRCA2, BRIP1, CDH1, CDK4, CDKN2A, CHEK2, DICER1, HOXB13, EPCAM, GREM1, MLH1, MSH2, MSH3, MSH6, MUTYH, NBN, NF1, NTHL1, PALB2, PMS2, POLD1, POLE, PTEN, RAD51C, RAD51D, RECQL, SMAD4, SMARCA4, STK11, and TP53.        FAMILY HISTORY:  We obtained a detailed, 4-generation family history.  Significant diagnoses are listed below: Family History  Problem Relation Age of Onset  . Ovarian cancer Mother 73  . Diabetes Paternal Grandmother   . Cancer Father        unknown type - possibly skin  . HIV Father   . Heart Problems Maternal Grandfather   . Cancer Paternal Grandfather        unknown type, diagnosed older than 22  . Cancer Maternal Uncle        unknown type, diagnosed older than 25  . Throat cancer Maternal Uncle        diagnosed older than 71s, smoker  . Cervical cancer Cousin        paternal cousin  . Breast cancer Neg Hx   . Colon cancer Neg Hx    Ms. Erichsen has one son who is 61 and has not had cancer. She has one full brother who is 23, two maternal half-sisters who are 76 and 33, and two maternal half-brothers who are 35 and 43. One of her nieces (half-sister's daughter) has had an abnormal pap smear, but there are no known diagnoses of cancer among any of these relatives.   Ms. Miskell mother was diagnosed with ovarian cancer at age 50, and died at age 46. She has one maternal aunt, who is in her 46s, and nine maternal uncles. Seven of her uncles have died, all over the age of 32, and one or two  of these individuals died from an unknown type of cancer. One of her living maternal uncles is in his 4s and has not had cancer, and the other is in his 23s and has had throat cancer that was diagnosed when he was older than his 76s. Of note, this uncle also has a history of smoking.  There are no known diagnoses of cancer among maternal cousins. Ms. Cherne maternal grandmother died in her 70s or 3s, and her grandfather died in his 62s from heart problems.  Ms. Belcher father died in his 51s and had many health problems, including HIV and an unknown type of cancer. Ms. Flammia believes this may have been a skin cancer, but she is not sure. She has three paternal aunts who are in their 17s and early 32s and have not had cancer. One of her paternal first cousins has had cervical cancer that required radiation. Her paternal grandmother is currently living and is in her 64s, and her paternal grandfather died at an unknown age (but older than 31) and had an unknown type of cancer.  Ms. Leu is unaware of previous family history of genetic testing for hereditary cancer risks. Her maternal and paternal ancestors are both from [place]. There is no reported Ashkenazi Jewish ancestry. There is no known consanguinity.  GENETIC TEST RESULTS: Genetic testing reported out on 04/25/2019 through the [facility] panel and detected no pathogenic variants.   The TumorNext-HRD panel offered by [facility] includes analysis of the tumor for somatic variants in the following 11 genes:  ATM, BARD1, BRIP1, CHEK2, MRE11A, NBN, PALB2, RAD51C, RAD51D, BRCA1, BRCA2 (sequencing only). The CancerNext gene panel offered by [employer] includes sequencing and rearrangement analysis of the blood for germline variants in the following 36 genes:   APC, ATM, AXIN2, BARD1, BMPR1A, BRCA1, BRCA2, BRIP1, CDH1, CDK4, CDKN2A, CHEK2, DICER1, HOXB13, EPCAM, GREM1, MLH1, MSH2, MSH3, MSH6, MUTYH, NBN, NF1, NTHL1, PALB2, PMS2, POLD1, POLE, PTEN, RAD51C, RAD51D, RECQL, SMAD4, SMARCA4, STK11, and TP53. The test report will be scanned into EPIC and located under the Molecular Pathology section of the Results Review tab.  A portion of the result report is included below for reference.   We discussed  that one goal of Ms. Spees's genetic testing was to identify if she may be a candidate for targeted treatment with PARP inhibitors. Therefore, in addition to having germline genetic testing to analyze for inherited variants, Ms. Suppa also had testing for homologous recombination deficiency (HRD). HRDtesting is a genetic test performed on the tumor that can determine somatic genetic changes that may indicate if Ms. Butz would benefit from PARP inhibitors. Because her test did not detect any pathogenic variants in her germline or somatic (tumor) testing, there are no targeted treatment recommendations based on these results.  We discussed with Ms. Johannsen that because current genetic testing is not perfect, it is possible there may be a gene mutation in one of these genes that current testing cannot detect, but that chance is small.  We also discussed, that there could be another gene that has not yet been discovered, or that we have not yet tested, that is responsible for the cancer diagnoses in the family. It is also possible there is a hereditary cause for the cancer in the family that Ms. Fulmore did not inherit and therefore was not identified in her testing.  Therefore, it is important to remain in touch with cancer genetics in the future so that we can continue to offer  Ms. Croson the most up to date genetic testing.   Genetic testing did identify two variants of uncertain significance (VUS) - one germline VUS in the NBN gene called c.812T>C, and one somatic VUS in the CHEK2 gene called c.1116_1117delCAinsTG.  At this time, it is unknown if these variants are associated with increased cancer risk or if they are normal findings, but most variants such as these get reclassified to being inconsequential. They should not be used to make medical management decisions. With time, we suspect the lab will determine the significance of these variants, if any. If we do learn more about them, we will try to  contact Ms. Iverson to discuss it further. However, it is important to stay in touch with [place] periodically and keep the address and phone number up to date.  CANCER SCREENING RECOMMENDATIONS: Ms. Dileo test result is considered negative (normal).  This means that we have not identified a hereditary cause for her personal and family history of cancer at this time. While reassuring, this does not definitively rule out a hereditary predisposition to cancer. It is still possible that there could be genetic mutations that are undetectable by current technology. There could be genetic mutations in genes that have not been tested or identified to increase cancer risk.  Therefore, it is recommended she continue to follow the cancer management and screening guidelines provided by her oncology and primary healthcare provider.   An individual's cancer risk and medical management are not determined by genetic test results alone. Overall cancer risk assessment incorporates additional factors, including personal medical history, family history, and any available genetic information that may result in a personalized plan for cancer prevention and surveillance.  RECOMMENDATIONS FOR FAMILY MEMBERS:  Individuals in this family might be at some increased risk of developing cancer, over the general population risk, simply due to the family history of cancer.  We recommended women in this family have a yearly mammogram beginning at age 58, or 77 years younger than the earliest onset of cancer, an annual clinical breast exam, and perform monthly breast self-exams. Women in this family should also have a gynecological exam as recommended by their primary provider. All family members should have a colonoscopy by age 58.  It is also possible there is a hereditary cause for the cancer in Ms. Ang's family that she did not inherit and therefore was not identified in her.  Based on Ms. Charrier's family history, we recommended close  relatives of her mother, who was diagnosed with ovarian cancer at age 65, have genetic counseling and testing. Ms. Kulesza will let us know if we can be of any assistance in coordinating genetic counseling and/or testing for this family member.   FOLLOW-UP: Lastly, we discussed with Ms. Schier that cancer genetics is a rapidly advancing field and it is possible that new genetic tests will be appropriate for her and/or her family members in the future. We encouraged her to remain in contact with cancer genetics on an annual basis so we can update her personal and family histories and let her know of advances in cancer genetics that may benefit this family.   Our contact number was provided. Ms. Ades's questions were answered to her satisfaction, and she knows she is welcome to call us at anytime with additional questions or concerns.   Clint Guy, MS, [facility] Certified Genetic Counselor [email or web address] Phone: [phone]

## 2019-04-27 NOTE — Telephone Encounter (Signed)
Revealed negative genetic testing.  Discussed that she had genetic testing on her blood that evaluated for inherited genetic changes that could explain her diagnosis of cancer and identify risks for other cancers. She also had HRD testing, which is genetic testing performed on the tumor that can determine somatic genetic changes that could influence her management.  Her results were normal for both of these tests. We discussed that we therefore do not know why she has ovarian cancer or why there is cancer in the family.  It could be due to a different gene that we are not testing, or our current technology may not be able detect something.  It will be important for her to keep in contact with genetics to keep up with whether additional testing may be appropriate in the future.   One germline variant of uncertain significance was detected in the NBN gene, called c.812T>C (p.V271A), and one somatic variant of uncertain significance was detected in the CHEK2 gene, called c.1116_1117delCAinsTG. Her results are still considered normal and should not influence her management.

## 2019-05-01 ENCOUNTER — Telehealth: Payer: Self-pay

## 2019-05-01 NOTE — Telephone Encounter (Signed)
Please let patient know, Insurance denied MRI cervical now

## 2019-05-01 NOTE — Telephone Encounter (Signed)
I have attached message below from [place];    Insurance denied this patient's exam.  Will the provider do a peer to peer, or should the exam be canceled?   INS:   [employer]  DOS:  Not yet scheduled  CPT:   [phone]   Phone number for peer to peer request:  [phone]  Ref# [id number]      Denial Reason:   Based on [employer] Guidelines Section(s): SP 3.1 Neck (Cervical Spine) Pain without and with Neurological Features (Including Stenosis), we cannot approve this request. Your records show that you have changes in the feeling in your arms and/or hand(s). The reason this request cannot be approved is because: 1. Guidelines may support imaging in the evaluation of suspected or known spinal disease with one of the following. -Failure to improve after a recent (within three months) six week trial of physician-guided clinical care with clinical re-evaluation. -Any signs or symptoms such as significant motor weakness, malignancy, infection, cauda equina syndrome, for which conservative treatment is not needed. The clinical information received fails to support meeting these requirements and, therefore, the request is not indicated at this time.

## 2019-05-02 ENCOUNTER — Telehealth: Payer: Self-pay | Admitting: Neurology

## 2019-05-02 NOTE — Telephone Encounter (Signed)
Sandre Kitty  [place], Myer Peer, Mellody Life   CcBerenice Primas, Jasmin  Insurance denied this patient's exam.  Will the provider do a peer to peer, or should the exam be canceled?   INS:   [employer]  DOS:  Not yet scheduled  CPT:   [phone]   Phone number for peer to peer request:  [phone]  Ref# [id number]      Denial Reason:   Based on [employer] Guidelines Section(s): SP 3.1 Neck (Cervical Spine) Pain without and with Neurological Features (Including Stenosis), we cannot approve this request. Your records show that you have changes in the feeling in your arms and/or hand(s). The reason this request cannot be approved is because: 1. Guidelines may support imaging in the evaluation of suspected or known spinal disease with one of the following. -Failure to improve after a recent (within three months) six week trial of physician-guided clinical care with clinical re-evaluation. -Any signs or symptoms such as significant motor weakness, malignancy, infection, cauda equina syndrome, for which conservative treatment is not needed. The clinical information received fails to support meeting these requirements and, therefore, the request is not indicated at this time.      Thank you,  Sandre Kitty

## 2019-05-02 NOTE — Telephone Encounter (Signed)
I called and informed the patient. DW

## 2019-05-02 NOTE — Telephone Encounter (Signed)
I will see her again in Feb, if needed, I will reorder MRI cervical

## 2019-05-04 MED FILL — DEXAMETHASONE 4 MG TABLET: 4 | 21 days supply | Qty: 4 | Fill #3

## 2019-05-05 ENCOUNTER — Inpatient Hospital Stay: Payer: Managed Care, Other (non HMO)

## 2019-05-05 ENCOUNTER — Encounter: Payer: Self-pay | Admitting: Hematology and Oncology

## 2019-05-05 ENCOUNTER — Other Ambulatory Visit: Payer: Self-pay

## 2019-05-05 ENCOUNTER — Inpatient Hospital Stay: Payer: Managed Care, Other (non HMO) | Attending: Gynecologic Oncology | Admitting: Hematology and Oncology

## 2019-05-05 DIAGNOSIS — R5381 Other malaise: Secondary | ICD-10-CM | POA: Insufficient documentation

## 2019-05-05 DIAGNOSIS — Z79899 Other long term (current) drug therapy: Secondary | ICD-10-CM | POA: Insufficient documentation

## 2019-05-05 DIAGNOSIS — G620 Drug-induced polyneuropathy: Secondary | ICD-10-CM | POA: Diagnosis not present

## 2019-05-05 DIAGNOSIS — Z90710 Acquired absence of both cervix and uterus: Secondary | ICD-10-CM | POA: Diagnosis not present

## 2019-05-05 DIAGNOSIS — G4700 Insomnia, unspecified: Secondary | ICD-10-CM | POA: Diagnosis not present

## 2019-05-05 DIAGNOSIS — C482 Malignant neoplasm of peritoneum, unspecified: Secondary | ICD-10-CM | POA: Insufficient documentation

## 2019-05-05 DIAGNOSIS — Z9079 Acquired absence of other genital organ(s): Secondary | ICD-10-CM | POA: Insufficient documentation

## 2019-05-05 DIAGNOSIS — Z5111 Encounter for antineoplastic chemotherapy: Secondary | ICD-10-CM | POA: Diagnosis not present

## 2019-05-05 DIAGNOSIS — T451X5A Adverse effect of antineoplastic and immunosuppressive drugs, initial encounter: Secondary | ICD-10-CM | POA: Diagnosis not present

## 2019-05-05 DIAGNOSIS — Z791 Long term (current) use of non-steroidal anti-inflammatories (NSAID): Secondary | ICD-10-CM | POA: Diagnosis not present

## 2019-05-05 DIAGNOSIS — Z90722 Acquired absence of ovaries, bilateral: Secondary | ICD-10-CM | POA: Insufficient documentation

## 2019-05-05 LAB — CMP (CANCER CENTER ONLY)
ALT: 30 U/L (ref 0–44)
AST: 27 U/L (ref 15–41)
Albumin: 3.5 g/dL (ref 3.5–5.0)
Alkaline Phosphatase: 83 U/L (ref 38–126)
Anion gap: 9 (ref 5–15)
BUN: 17 mg/dL (ref 6–20)
CO2: 28 mmol/L (ref 22–32)
Calcium: 9.1 mg/dL (ref 8.9–10.3)
Chloride: 104 mmol/L (ref 98–111)
Creatinine: 0.76 mg/dL (ref 0.44–1.00)
GFR, Est AFR Am: >60 mL/min (ref >60)
GFR, Estimated: >60 mL/min (ref >60)
Glucose, Bld: 115 mg/dL — ABNORMAL HIGH (ref 70–99)
Potassium: 3.8 mmol/L (ref 3.5–5.1)
Sodium: 141 mmol/L (ref 135–145)
Total Bilirubin: 0.3 mg/dL (ref 0.3–1.2)
Total Protein: 7.3 g/dL (ref 6.5–8.1)

## 2019-05-05 LAB — CBC WITH DIFFERENTIAL (CANCER CENTER ONLY)
Abs Immature Granulocytes: 0.03 10*3/uL (ref 0.00–0.07)
Basophils Absolute: 0 10*3/uL (ref 0.0–0.1)
Basophils Relative: 0 %
Eosinophils Absolute: 0 10*3/uL (ref 0.0–0.5)
Eosinophils Relative: 1 %
HCT: 32.6 % — ABNORMAL LOW (ref 36.0–46.0)
Hemoglobin: 10.6 g/dL — ABNORMAL LOW (ref 12.0–15.0)
Immature Granulocytes: 1 %
Lymphocytes Relative: 25 %
Lymphs Abs: 1.4 10*3/uL (ref 0.7–4.0)
MCH: 28.6 pg (ref 26.0–34.0)
MCHC: 32.5 g/dL (ref 30.0–36.0)
MCV: 87.9 fL (ref 80.0–100.0)
Monocytes Absolute: 0.6 10*3/uL (ref 0.1–1.0)
Monocytes Relative: 10 %
Neutro Abs: 3.4 10*3/uL (ref 1.7–7.7)
Neutrophils Relative %: 63 %
Platelet Count: 179 10*3/uL (ref 150–400)
RBC: 3.71 MIL/uL — ABNORMAL LOW (ref 3.87–5.11)
RDW: 13.8 % (ref 11.5–15.5)
WBC Count: 5.3 10*3/uL (ref 4.0–10.5)
nRBC: 0 % (ref 0.0–0.2)

## 2019-05-05 NOTE — Assessment & Plan Note (Signed)
She is immunocompromised and experiencing multiple side effects of therapy I recommend her not to work until she finished all her treatment

## 2019-05-05 NOTE — Assessment & Plan Note (Addendum)
She tolerated recent chemotherapy better She still have bone pain, insomnia and neuropathy from treatment She will proceed with treatment as scheduled next week I explained to her why she is not able to work until she has completed her chemotherapy I spent some time completing her disability paperwork today

## 2019-05-05 NOTE — Assessment & Plan Note (Signed)
she has mild peripheral neuropathy, likely related to side effects of treatment. She will continue with similar dose adjustment as before

## 2019-05-05 NOTE — Progress Notes (Signed)
[facility] OFFICE PROGRESS NOTE  Patient Care Team: [place]. 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 She will continue with similar dose adjustment as before  Physical debility She is immunocompromised and experiencing multiple side effects of therapy I recommend her not to work until she finished all her treatment   No orders of the defined types were placed in this encounter.   INTERVAL HISTORY: Please see below for problem oriented charting. She returns for further follow-up She tolerated recent treatment well except for fatigue, insomnia, bone pain and neuropathy She denies worsening neuropathy No recent nausea or constipation No recent infection, fever or chills  SUMMARY OF ONCOLOGIC HISTORY: Oncology History Overview Note  Negative genetics   Primary peritoneal adenocarcinoma ([place])  11/24/2018 Imaging   CT chest with IV contrast elsewhere Stable small lung nodules, the largest is in the left lower lobe measuring 6 mm. These are unchanged from November 2017 and are benign in nature. No new nodules.    01/03/2019 Imaging   MRI pelvis Uterus: The uterus measures 5.8 x 9.3 x 5.8 cm. Unremarkable ENDOMETRIUM. Multiple uterine body intramural fibroids measuring up to 14 mm in size.  Mild endometrial fluid.   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 - Carcinoma appears to be arising in adnexal soft tissue, considered most consistent with stage pT2 (see comment) - Ovary with physiologic changes and and fallopian tube with paratubal cyst and surface adhesions; no definite parenchymal involvement by carcinoma identified - See synoptic report and comment  B: Lymph nodes, right common iliac, lymphadenectomy - Seven lymph nodes with no metastatic carcinoma identified (0/7)  C: Uterus with cervix and left ovary and fallopian tube, hysterectomy and left salpingo-oophorectomy Cervix: Ectocervix and endocervix with no dysplasia or malignancy identified Endometrium: Endometrium with hemorrhage and weakly proliferative features; no hyperplasia, atypia, or malignancy identified Myometrium: Adenomyosis and benign leiomyomata measuring up to 1.1 cm Ovary, left: Hemorrhagic follicular cyst, small serous inclusions, and physiologic changes Fallopian tube, left: Paratubal cysts and no malignancy identified  D: Lymph nodes, right pelvic, lymphadenectomy - Nine lymph nodes with no metastatic carcinoma identified (0/9)  E: Lymph nodes, left pelvic, lymphadenectomy - Three lymph nodes with no metastatic carcinoma identified (0/3)  F: Peritoneum, left abdominal, biopsy - Fibroadipose tissue with no malignancy identified  G: Peritoneum, right pelvic, biopsy - Fibroadipose tissue with no malignancy identified  H: Peritoneum, right abdominal, biopsy - Fibroadipose tissue with no malignancy identified  I: Peritoneum, left pelvic, biopsy - Fibroadipose tissue with no malignancy identified  J: Peritoneum, posterior cul de sac, biopsy - Fibroadipose tissue with calcifications and no malignancy identified  K: Peritoneum, anterior cul de sac, biopsy - Fibroadipose tissue with no malignancy identified  L: Omentum, omentectomy - Adipose and fibrovascular tissue consistent with omentum  - No  malignancy identified    02/08/2019 Surgery   Preoperative Diagnoses: Adnexal mass, Obesity BMI 40  Postoperative Diagnoses: Right adnexal mass, fallopian tube adenocarcinoma; Obesity BMI 40  Procedures: Diagnostic laparoscopy, Robotic-assisted total laparoscopic hysterectomy, bilateral salpingo-oophorectomy, bilateral pelvic lymphadenectomy, right common iliac lymphadenectomy, peritoneal biopsies, minilaparotomy for infragastric ometectomy  Surgeon: Janie Morning, MD, PhD  Findings: Normal upper abdominal survey: normal liver surface, diaphragm and stomach, normal appearing small and large bowel, normal omentum. No evidence of peritoneal disease or carcinomatosis. Small amount of ascites in the posterior cul-de-sac upon entry. Enlarged right adnexal mass (~8cm), appears to be arising from the fallopian tube with an otherwise normal appearing ovary. There was no intraoperative, intraperitoneal rupture of the mass; controlled removal of the mass occurred extraperitoneally in a bag. Normal appearing uterus and left adnexa. Normal appearing omentum. Patient with a short mesentery precluding adequate visualization of the aorta. Small nodules in the posterior cul-de-sac, biopsied; no additional peritoneal disease. R0 resection.    02/20/2019 Cancer Staging   Staging form: Ovary, Fallopian Tube, and Primary Peritoneal Carcinoma, AJCC 8th Edition - Pathologic: Stage II (pT2, pN0, cM0) - Signed by Heath Lark, MD on 02/20/2019   02/28/2019 Tumor Marker   Patient's tumor was tested for the following markers: CA-125 Results of the tumor marker test revealed 26.8.   03/03/2019 Procedure   Successful placement of a right IJ approach Power Port with ultrasound and fluoroscopic guidance. The catheter is ready for use.   03/06/2019 -  Chemotherapy   The patient had carboplatin and taxol for chemotherapy treatment.     03/27/2019 Tumor Marker   Patient's tumor was tested for the following markers:  CA-125. Results of the tumor marker test revealed 8.8   04/17/2019 Tumor Marker   Patient's tumor was tested for the following markers: CA-125 Results of the tumor marker test revealed 8.5   04/25/2019 Genetic Testing   Negative genetic testing:  No pathogenic variants detected on the [employer] TumorNext-HRD+CancerNext panel. Testing did identify two variants of uncertain significance (VUS). One germline VUS was detected in the NBN gene, called c.812T>C (p.V271A). One somatic VUS was detected in the CHEK2 gene, called c.[id number]. The report date is 04/25/2019.  The TumorNext-HRD panel offered by [employer] includes analysis of the tumor for somatic variants in the following 11 genes:  ATM, BARD1, BRIP1, CHEK2, MRE11A, NBN, PALB2, RAD51C, RAD51D, BRCA1, BRCA2 (sequencing only). The CancerNext gene panel offered by [employer] includes sequencing and rearrangement analysis of the blood for germline variants in the following 36 genes:   APC, ATM, AXIN2, BARD1, BMPR1A, BRCA1, BRCA2, BRIP1, CDH1, CDK4, CDKN2A, CHEK2, DICER1, HOXB13, EPCAM, GREM1, MLH1, MSH2, MSH3, MSH6, MUTYH, NBN, NF1, NTHL1, PALB2, PMS2, POLD1, POLE, PTEN, RAD51C, RAD51D, RECQL, SMAD4, SMARCA4, STK11, and TP53.        REVIEW OF SYSTEMS:   Constitutional: Denies fevers, chills or abnormal weight loss Eyes: Denies blurriness of vision Ears, nose, mouth, throat, and face: Denies mucositis or sore throat Respiratory: Denies cough, dyspnea or wheezes Cardiovascular: Denies palpitation, chest discomfort or lower extremity swelling Gastrointestinal:  Denies nausea, heartburn or change in bowel habits Skin: Denies abnormal skin rashes Lymphatics: Denies new lymphadenopathy or easy bruising Neurological:Denies numbness, tingling or new weaknesses Behavioral/Psych: Mood is stable, no new changes  All other systems were reviewed with the patient and are negative.  I have reviewed the past medical history, past surgical  history, social history and family history with the patient and they are unchanged from previous note.  ALLERGIES:  has No Known Allergies.  MEDICATIONS:  Current Outpatient Medications  Medication Sig Dispense Refill  .  acetaminophen (TYLENOL) 325 MG tablet Take 650 mg by mouth every 6 (six) hours as needed.    Marland Kitchen dexamethasone (DECADRON) 4 MG tablet Take 2 tabs at the night before and 2 tab the morning of chemotherapy, every 3 weeks, by mouth 24 tablet 9  . diclofenac Sodium (VOLTAREN) 1 % GEL Apply 4 g topically 4 (four) times daily as needed. 100 g 5  . gabapentin (NEURONTIN) 100 MG capsule Take 1 capsule (100 mg total) by mouth 3 (three) times daily. 90 capsule 11  . ibuprofen (ADVIL) 600 MG tablet Take 600 mg by mouth every 6 (six) hours as needed.    . lidocaine-prilocaine (EMLA) cream Apply to affected area 4 times a day prn 30 g 3  . ondansetron (ZOFRAN) 8 MG tablet Take 1 tablet (8 mg total) by mouth every 8 (eight) hours as needed for refractory nausea / vomiting. Start on day 3 after carboplatin chemo. 30 tablet 1  . prochlorperazine (COMPAZINE) 10 MG tablet Take 1 tablet (10 mg total) by mouth every 6 (six) hours as needed (Nausea or vomiting). 30 tablet 1   No current facility-administered medications for this visit.    PHYSICAL EXAMINATION: ECOG PERFORMANCE STATUS: 1 - Symptomatic but completely ambulatory  Vitals:   05/05/19 1030  BP: 131/86  Pulse: 83  Resp: 18  Temp: 98.3 F (36.8 C)  SpO2: 100%   Filed Weights   05/05/19 1030  Weight: 218 lb 14.4 oz (99.3 kg)    GENERAL:alert, no distress and comfortable SKIN: skin color, texture, turgor are normal, no rashes or significant lesions EYES: normal, Conjunctiva are pink and non-injected, sclera clear OROPHARYNX:no exudate, no erythema and lips, buccal mucosa, and tongue normal  NECK: supple, thyroid normal size, non-tender, without nodularity LYMPH:  no palpable lymphadenopathy in the cervical, axillary or  inguinal LUNGS: clear to auscultation and percussion with normal breathing effort HEART: regular rate & rhythm and no murmurs and no lower extremity edema ABDOMEN:abdomen soft, non-tender and normal bowel sounds Musculoskeletal:no cyanosis of digits and no clubbing  NEURO: alert & oriented x 3 with fluent speech, no focal motor/sensory deficits  LABORATORY DATA:  I have reviewed the data as listed    Component Value Date/Time   NA 141 05/05/2019 1005   K 3.8 05/05/2019 1005   CL 104 05/05/2019 1005   CO2 28 05/05/2019 1005   GLUCOSE 115 (H) 05/05/2019 1005   BUN 17 05/05/2019 1005   CREATININE 0.76 05/05/2019 1005   CALCIUM 9.1 05/05/2019 1005   PROT 7.3 05/05/2019 1005   ALBUMIN 3.5 05/05/2019 1005   AST 27 05/05/2019 1005   ALT 30 05/05/2019 1005   ALKPHOS 83 05/05/2019 1005   BILITOT 0.3 05/05/2019 1005   GFRNONAA >60 05/05/2019 1005   GFRAA >60 05/05/2019 1005    No results found for: SPEP, UPEP  Lab Results  Component Value Date   WBC 5.3 05/05/2019   NEUTROABS 3.4 05/05/2019   HGB 10.6 (L) 05/05/2019   HCT 32.6 (L) 05/05/2019   MCV 87.9 05/05/2019   PLT 179 05/05/2019      Chemistry      Component Value Date/Time   NA 141 05/05/2019 1005   K 3.8 05/05/2019 1005   CL 104 05/05/2019 1005   CO2 28 05/05/2019 1005   BUN 17 05/05/2019 1005   CREATININE 0.76 05/05/2019 1005      Component Value Date/Time   CALCIUM 9.1 05/05/2019 1005   ALKPHOS 83 05/05/2019 1005   AST  27 05/05/2019 1005   ALT 30 05/05/2019 1005   BILITOT 0.3 05/05/2019 1005       All questions were answered. The patient knows to call the clinic with any problems, questions or concerns. No barriers to learning was detected.  I spent 15 minutes counseling the patient face to face. The total time spent in the appointment was 20 minutes and more than 50% was on counseling and review of test results  Heath Lark, MD 05/05/2019 1:11 PM

## 2019-05-06 LAB — CA 125: Cancer Antigen (CA) 125: 6.6 U/mL (ref 0.0–38.1)

## 2019-05-08 ENCOUNTER — Inpatient Hospital Stay: Payer: Managed Care, Other (non HMO)

## 2019-05-08 ENCOUNTER — Other Ambulatory Visit: Payer: Self-pay

## 2019-05-08 VITALS — BP 119/75 | HR 84 | Temp 98.0°F | Resp 18

## 2019-05-08 DIAGNOSIS — C482 Malignant neoplasm of peritoneum, unspecified: Secondary | ICD-10-CM

## 2019-05-08 DIAGNOSIS — Z5111 Encounter for antineoplastic chemotherapy: Secondary | ICD-10-CM | POA: Diagnosis not present

## 2019-05-08 MED ORDER — SODIUM CHLORIDE 0.9 % IV SOLN
140.0000 mg/m2 | Freq: Once | INTRAVENOUS | Status: AC
  Administered 2019-05-08: 294 mg via INTRAVENOUS
  Filled 2019-05-08: qty 49

## 2019-05-08 MED ORDER — DIPHENHYDRAMINE HCL 50 MG/ML IJ SOLN
50.0000 mg | Freq: Once | INTRAMUSCULAR | Status: AC
  Administered 2019-05-08: 50 mg via INTRAVENOUS

## 2019-05-08 MED ORDER — SODIUM CHLORIDE 0.9 % IV SOLN
750.0000 mg | Freq: Once | INTRAVENOUS | Status: AC
  Administered 2019-05-08: 750 mg via INTRAVENOUS
  Filled 2019-05-08: qty 75

## 2019-05-08 MED ORDER — HEPARIN SOD (PORK) LOCK FLUSH 100 UNIT/ML IV SOLN
500.0000 [IU] | Freq: Once | INTRAVENOUS | Status: AC | PRN
  Administered 2019-05-08: 500 [IU]
  Filled 2019-05-08: qty 5

## 2019-05-08 MED ORDER — SODIUM CHLORIDE 0.9 % IV SOLN
Freq: Once | INTRAVENOUS | Status: AC
  Filled 2019-05-08: qty 5

## 2019-05-08 MED ORDER — DIPHENHYDRAMINE HCL 50 MG/ML IJ SOLN
INTRAMUSCULAR | Status: AC
  Filled 2019-05-08: qty 1

## 2019-05-08 MED ORDER — SODIUM CHLORIDE 0.9 % IV SOLN
Freq: Once | INTRAVENOUS | Status: AC
  Filled 2019-05-08: qty 250

## 2019-05-08 MED ORDER — SODIUM CHLORIDE 0.9% FLUSH
10.0000 mL | INTRAVENOUS | Status: DC | PRN
  Administered 2019-05-08: 10 mL
  Filled 2019-05-08: qty 10

## 2019-05-08 MED ORDER — FAMOTIDINE IN NACL 20-0.9 MG/50ML-% IV SOLN
20.0000 mg | Freq: Once | INTRAVENOUS | Status: AC
  Administered 2019-05-08: 20 mg via INTRAVENOUS

## 2019-05-08 MED ORDER — FAMOTIDINE IN NACL 20-0.9 MG/50ML-% IV SOLN
INTRAVENOUS | Status: AC
  Filled 2019-05-08: qty 50

## 2019-05-08 MED ORDER — PALONOSETRON HCL INJECTION 0.25 MG/5ML
0.2500 mg | Freq: Once | INTRAVENOUS | Status: AC
  Administered 2019-05-08: 0.25 mg via INTRAVENOUS

## 2019-05-08 MED ORDER — PALONOSETRON HCL INJECTION 0.25 MG/5ML
INTRAVENOUS | Status: AC
  Filled 2019-05-08: qty 5

## 2019-05-08 NOTE — Patient Instructions (Signed)
   [facility] Discharge Instructions for Patients Receiving Chemotherapy  Today you received the following chemotherapy agents Taxol and Carboplatin   To help prevent nausea and vomiting after your treatment, we encourage you to take your nausea medication as directed.    If you develop nausea and vomiting that is not controlled by your nausea medication, call the clinic.   BELOW ARE SYMPTOMS THAT SHOULD BE REPORTED IMMEDIATELY:  *FEVER GREATER THAN 100.5 F  *CHILLS WITH OR WITHOUT FEVER  NAUSEA AND VOMITING THAT IS NOT CONTROLLED WITH YOUR NAUSEA MEDICATION  *UNUSUAL SHORTNESS OF BREATH  *UNUSUAL BRUISING OR BLEEDING  TENDERNESS IN MOUTH AND THROAT WITH OR WITHOUT PRESENCE OF ULCERS  *URINARY PROBLEMS  *BOWEL PROBLEMS  UNUSUAL RASH Items with * indicate a potential emergency and should be followed up as soon as possible.  Feel free to call the clinic should you have any questions or concerns. The clinic phone number is [phone].  Please show the CHEMO ALERT CARD at check-in to the Emergency Department and triage nurse.   

## 2019-05-25 MED FILL — DEXAMETHASONE 4 MG TABLET: 4 | 21 days supply | Qty: 4 | Fill #4

## 2019-05-25 MED FILL — ONDANSETRON HCL 8 MG TABLET: 8 | 21 days supply | Qty: 18 | Fill #2

## 2019-05-25 MED FILL — LIDOCAINE-PRILOCAINE CREAM: 2.5-2.5 | 30 days supply | Qty: 30 | Fill #1

## 2019-05-29 ENCOUNTER — Inpatient Hospital Stay: Payer: 59 | Attending: Gynecologic Oncology | Admitting: Hematology and Oncology

## 2019-05-29 ENCOUNTER — Inpatient Hospital Stay: Payer: 59

## 2019-05-29 ENCOUNTER — Other Ambulatory Visit: Payer: Self-pay

## 2019-05-29 DIAGNOSIS — M2550 Pain in unspecified joint: Secondary | ICD-10-CM | POA: Diagnosis not present

## 2019-05-29 DIAGNOSIS — T451X5A Adverse effect of antineoplastic and immunosuppressive drugs, initial encounter: Secondary | ICD-10-CM

## 2019-05-29 DIAGNOSIS — R5381 Other malaise: Secondary | ICD-10-CM

## 2019-05-29 DIAGNOSIS — Z6841 Body Mass Index (BMI) 40.0 and over, adult: Secondary | ICD-10-CM | POA: Diagnosis not present

## 2019-05-29 DIAGNOSIS — Z5111 Encounter for antineoplastic chemotherapy: Secondary | ICD-10-CM | POA: Insufficient documentation

## 2019-05-29 DIAGNOSIS — D251 Intramural leiomyoma of uterus: Secondary | ICD-10-CM | POA: Insufficient documentation

## 2019-05-29 DIAGNOSIS — G620 Drug-induced polyneuropathy: Secondary | ICD-10-CM | POA: Diagnosis not present

## 2019-05-29 DIAGNOSIS — G4700 Insomnia, unspecified: Secondary | ICD-10-CM | POA: Insufficient documentation

## 2019-05-29 DIAGNOSIS — K219 Gastro-esophageal reflux disease without esophagitis: Secondary | ICD-10-CM

## 2019-05-29 DIAGNOSIS — C482 Malignant neoplasm of peritoneum, unspecified: Secondary | ICD-10-CM

## 2019-05-29 DIAGNOSIS — Z90722 Acquired absence of ovaries, bilateral: Secondary | ICD-10-CM | POA: Insufficient documentation

## 2019-05-29 DIAGNOSIS — Z90710 Acquired absence of both cervix and uterus: Secondary | ICD-10-CM | POA: Insufficient documentation

## 2019-05-29 DIAGNOSIS — Z79899 Other long term (current) drug therapy: Secondary | ICD-10-CM | POA: Insufficient documentation

## 2019-05-29 DIAGNOSIS — M898X9 Other specified disorders of bone, unspecified site: Secondary | ICD-10-CM | POA: Insufficient documentation

## 2019-05-29 DIAGNOSIS — E669 Obesity, unspecified: Secondary | ICD-10-CM | POA: Diagnosis not present

## 2019-05-29 LAB — CMP (CANCER CENTER ONLY)
ALT: 36 U/L (ref 0–44)
AST: 24 U/L (ref 15–41)
Albumin: 3.6 g/dL (ref 3.5–5.0)
Alkaline Phosphatase: 86 U/L (ref 38–126)
Anion gap: 11 (ref 5–15)
BUN: 19 mg/dL (ref 6–20)
CO2: 23 mmol/L (ref 22–32)
Calcium: 9.1 mg/dL (ref 8.9–10.3)
Chloride: 104 mmol/L (ref 98–111)
Creatinine: 0.8 mg/dL (ref 0.44–1.00)
GFR, Est AFR Am: >60 mL/min (ref >60)
GFR, Estimated: >60 mL/min (ref >60)
Glucose, Bld: 231 mg/dL — ABNORMAL HIGH (ref 70–99)
Potassium: 3.8 mmol/L (ref 3.5–5.1)
Sodium: 138 mmol/L (ref 135–145)
Total Bilirubin: 0.3 mg/dL (ref 0.3–1.2)
Total Protein: 7.5 g/dL (ref 6.5–8.1)

## 2019-05-29 LAB — CBC WITH DIFFERENTIAL (CANCER CENTER ONLY)
Abs Immature Granulocytes: 0.07 10*3/uL (ref 0.00–0.07)
Basophils Absolute: 0 10*3/uL (ref 0.0–0.1)
Basophils Relative: 0 %
Eosinophils Absolute: 0 10*3/uL (ref 0.0–0.5)
Eosinophils Relative: 0 %
HCT: 33 % — ABNORMAL LOW (ref 36.0–46.0)
Hemoglobin: 10.9 g/dL — ABNORMAL LOW (ref 12.0–15.0)
Immature Granulocytes: 1 %
Lymphocytes Relative: 8 %
Lymphs Abs: 0.7 10*3/uL (ref 0.7–4.0)
MCH: 29.1 pg (ref 26.0–34.0)
MCHC: 33 g/dL (ref 30.0–36.0)
MCV: 88 fL (ref 80.0–100.0)
Monocytes Absolute: 0.1 10*3/uL (ref 0.1–1.0)
Monocytes Relative: 1 %
Neutro Abs: 7.1 10*3/uL (ref 1.7–7.7)
Neutrophils Relative %: 90 %
Platelet Count: 178 10*3/uL (ref 150–400)
RBC: 3.75 MIL/uL — ABNORMAL LOW (ref 3.87–5.11)
RDW: 14.7 % (ref 11.5–15.5)
WBC Count: 7.9 10*3/uL (ref 4.0–10.5)
nRBC: 0 % (ref 0.0–0.2)

## 2019-05-29 MED ORDER — DIPHENHYDRAMINE HCL 50 MG/ML IJ SOLN
50.0000 mg | Freq: Once | INTRAMUSCULAR | Status: AC
  Administered 2019-05-29: 50 mg via INTRAVENOUS

## 2019-05-29 MED ORDER — SODIUM CHLORIDE 0.9 % IV SOLN
Freq: Once | INTRAVENOUS | Status: AC
  Filled 2019-05-29: qty 5

## 2019-05-29 MED ORDER — SODIUM CHLORIDE 0.9% FLUSH
10.0000 mL | INTRAVENOUS | Status: DC | PRN
  Administered 2019-05-29: 10 mL
  Filled 2019-05-29: qty 10

## 2019-05-29 MED ORDER — SODIUM CHLORIDE 0.9% FLUSH
10.0000 mL | Freq: Once | INTRAVENOUS | Status: AC
  Administered 2019-05-29: 10 mL
  Filled 2019-05-29: qty 10

## 2019-05-29 MED ORDER — SODIUM CHLORIDE 0.9 % IV SOLN
140.0000 mg/m2 | Freq: Once | INTRAVENOUS | Status: AC
  Administered 2019-05-29: 294 mg via INTRAVENOUS
  Filled 2019-05-29: qty 49

## 2019-05-29 MED ORDER — DEXAMETHASONE 4 MG PO TABS: ORAL_TABLET | ORAL | 9 refills | Status: DC

## 2019-05-29 MED ORDER — FAMOTIDINE IN NACL 20-0.9 MG/50ML-% IV SOLN
20.0000 mg | Freq: Once | INTRAVENOUS | Status: AC
  Administered 2019-05-29: 20 mg via INTRAVENOUS

## 2019-05-29 MED ORDER — HEPARIN SOD (PORK) LOCK FLUSH 100 UNIT/ML IV SOLN
500.0000 [IU] | Freq: Once | INTRAVENOUS | Status: AC | PRN
  Administered 2019-05-29: 500 [IU]
  Filled 2019-05-29: qty 5

## 2019-05-29 MED ORDER — SODIUM CHLORIDE 0.9 % IV SOLN
Freq: Once | INTRAVENOUS | Status: AC
  Filled 2019-05-29: qty 250

## 2019-05-29 MED ORDER — DIPHENHYDRAMINE HCL 50 MG/ML IJ SOLN
INTRAMUSCULAR | Status: AC
  Filled 2019-05-29: qty 1

## 2019-05-29 MED ORDER — FAMOTIDINE IN NACL 20-0.9 MG/50ML-% IV SOLN
INTRAVENOUS | Status: AC
  Filled 2019-05-29: qty 50

## 2019-05-29 MED ORDER — PALONOSETRON HCL INJECTION 0.25 MG/5ML
0.2500 mg | Freq: Once | INTRAVENOUS | Status: AC
  Administered 2019-05-29: 0.25 mg via INTRAVENOUS

## 2019-05-29 MED ORDER — DEXAMETHASONE 4 MG PO TABS: ORAL_TABLET | ORAL | 0 refills | Status: DC

## 2019-05-29 MED ORDER — SODIUM CHLORIDE 0.9 % IV SOLN
750.0000 mg | Freq: Once | INTRAVENOUS | Status: AC
  Administered 2019-05-29: 750 mg via INTRAVENOUS
  Filled 2019-05-29: qty 75

## 2019-05-29 MED ORDER — PALONOSETRON HCL INJECTION 0.25 MG/5ML
INTRAVENOUS | Status: AC
  Filled 2019-05-29: qty 5

## 2019-05-29 NOTE — Patient Instructions (Signed)
[facility] Discharge Instructions for Patients Receiving Chemotherapy  Today you received the following chemotherapy agents: Taxol/Carbo  To help prevent nausea and vomiting after your treatment, we encourage you to take your nausea medication as directed.   If you develop nausea and vomiting that is not controlled by your nausea medication, call the clinic.   BELOW ARE SYMPTOMS THAT SHOULD BE REPORTED IMMEDIATELY:  *FEVER GREATER THAN 100.5 F  *CHILLS WITH OR WITHOUT FEVER  NAUSEA AND VOMITING THAT IS NOT CONTROLLED WITH YOUR NAUSEA MEDICATION  *UNUSUAL SHORTNESS OF BREATH  *UNUSUAL BRUISING OR BLEEDING  TENDERNESS IN MOUTH AND THROAT WITH OR WITHOUT PRESENCE OF ULCERS  *URINARY PROBLEMS  *BOWEL PROBLEMS  UNUSUAL RASH Items with * indicate a potential emergency and should be followed up as soon as possible.  Feel free to call the clinic should you have any questions or concerns. The clinic phone number is [phone].  Please show the CHEMO ALERT CARD at check-in to the Emergency Department and triage nurse.   

## 2019-05-30 ENCOUNTER — Encounter: Payer: Self-pay | Admitting: Hematology and Oncology

## 2019-05-30 DIAGNOSIS — K219 Gastro-esophageal reflux disease without esophagitis: Secondary | ICD-10-CM | POA: Insufficient documentation

## 2019-05-30 LAB — CA 125: Cancer Antigen (CA) 125: 7.1 U/mL (ref 0.0–38.1)

## 2019-05-30 NOTE — Assessment & Plan Note (Signed)
she has mild peripheral neuropathy, likely related to side effects of treatment. She will continue with similar dose adjustment as before

## 2019-05-30 NOTE — Assessment & Plan Note (Signed)
The patient has multiple side effects from treatment She is concerned about work I do not recommend her to return back to work until she has completed her treatment plus an additional month to recover I will try to get her additional help with paperwork if needed to help applying for disability

## 2019-05-30 NOTE — Progress Notes (Signed)
[place] OFFICE PROGRESS NOTE  Patient Care Team: Philip. 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 She will continue with similar dose adjustment as before  Physical debility The patient has multiple side effects from treatment She is concerned about work I do not recommend her to return back to work until she has completed her treatment plus an additional month to recover I will try to get her additional help with paperwork if needed to help applying for disability  GERD (gastroesophageal reflux disease) This is likely related to steroids treatment Recommend reduce oral steroids and IV steroids and conservative approach with antiacid   No orders of the defined types were placed in this encounter.   All questions were answered. The patient knows to call the clinic with any problems, questions or concerns. The total time spent in the appointment was 20 minutes encounter with patients including review of chart and various tests results, discussions about plan of care and coordination of care plan   Heath Lark, MD 05/30/2019 7:46 AM  INTERVAL HISTORY: Please see below for problem oriented charting. She returns for further follow-up She complained of stomach reflux Her neuropathy is persistent She have joint pain as well after treatment She is concerned about returning back to work According to the patient, her FMLA ran out by May 04, 2019 Denies recent  nausea or changes in bowel habits  SUMMARY OF ONCOLOGIC HISTORY: Oncology History Overview Note  Negative genetics   Primary peritoneal adenocarcinoma ([place])  11/24/2018 Imaging   CT chest with IV contrast elsewhere Stable small lung nodules, the largest is in the left lower lobe measuring 6 mm. These are unchanged from November 2017 and are benign in nature. No new nodules.    01/03/2019 Imaging   MRI pelvis Uterus: The uterus measures 5.8 x 9.3 x 5.8 cm. Unremarkable ENDOMETRIUM. Multiple uterine body intramural fibroids measuring up to 14 mm in size.  Mild endometrial fluid.   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 - Carcinoma appears to be arising in adnexal soft tissue, considered most consistent with stage pT2 (see comment) - Ovary with physiologic changes and and fallopian tube with paratubal cyst and surface adhesions; no definite parenchymal involvement by carcinoma identified - See synoptic report and comment  B: Lymph nodes, right common iliac, lymphadenectomy - Seven lymph nodes with no metastatic carcinoma identified (0/7)  C: Uterus with cervix and left ovary and fallopian tube, hysterectomy and left salpingo-oophorectomy Cervix: Ectocervix and endocervix with no dysplasia or malignancy identified Endometrium: Endometrium with hemorrhage and weakly proliferative features; no hyperplasia, atypia, or malignancy identified Myometrium: Adenomyosis and benign leiomyomata measuring up to 1.1 cm Ovary, left: Hemorrhagic follicular cyst, small serous inclusions, and physiologic changes Fallopian tube, left: Paratubal cysts and no malignancy identified  D: Lymph nodes, right pelvic, lymphadenectomy - Nine lymph nodes with no metastatic carcinoma identified (0/9)  E: Lymph nodes, left pelvic, lymphadenectomy - Three lymph nodes  with no metastatic carcinoma  identified (0/3)  F: Peritoneum, left abdominal, biopsy - Fibroadipose tissue with no malignancy identified  G: Peritoneum, right pelvic, biopsy - Fibroadipose tissue with no malignancy identified  H: Peritoneum, right abdominal, biopsy - Fibroadipose tissue with no malignancy identified  I: Peritoneum, left pelvic, biopsy - Fibroadipose tissue with no malignancy identified  J: Peritoneum, posterior cul de sac, biopsy - Fibroadipose tissue with calcifications and no malignancy identified  K: Peritoneum, anterior cul de sac, biopsy - Fibroadipose tissue with no malignancy identified  L: Omentum, omentectomy - Adipose and fibrovascular tissue consistent with omentum  - No malignancy identified    02/08/2019 Surgery   Preoperative Diagnoses: Adnexal mass, Obesity BMI 40  Postoperative Diagnoses: Right adnexal mass, fallopian tube adenocarcinoma; Obesity BMI 40  Procedures: Diagnostic laparoscopy, Robotic-assisted total laparoscopic hysterectomy, bilateral salpingo-oophorectomy, bilateral pelvic lymphadenectomy, right common iliac lymphadenectomy, peritoneal biopsies, minilaparotomy for infragastric ometectomy  Surgeon: Janie Morning, MD, PhD  Findings: Normal upper abdominal survey: normal liver surface, diaphragm and stomach, normal appearing small and large bowel, normal omentum. No evidence of peritoneal disease or carcinomatosis. Small amount of ascites in the posterior cul-de-sac upon entry. Enlarged right adnexal mass (~8cm), appears to be arising from the fallopian tube with an otherwise normal appearing ovary. There was no intraoperative, intraperitoneal rupture of the mass; controlled removal of the mass occurred extraperitoneally in a bag. Normal appearing uterus and left adnexa. Normal appearing omentum. Patient with a short mesentery precluding adequate visualization of the aorta. Small nodules in the posterior cul-de-sac, biopsied; no additional peritoneal disease. R0  resection.    02/20/2019 Cancer Staging   Staging form: Ovary, Fallopian Tube, and Primary Peritoneal Carcinoma, AJCC 8th Edition - Pathologic: Stage II (pT2, pN0, cM0) - Signed by Heath Lark, MD on 02/20/2019   02/28/2019 Tumor Marker   Patient's tumor was tested for the following markers: CA-125 Results of the tumor marker test revealed 26.8.   03/03/2019 Procedure   Successful placement of a right IJ approach Power Port with ultrasound and fluoroscopic guidance. The catheter is ready for use.   03/06/2019 -  Chemotherapy   The patient had carboplatin and taxol for chemotherapy treatment.     03/27/2019 Tumor Marker   Patient's tumor was tested for the following markers: CA-125. Results of the tumor marker test revealed 8.8   04/17/2019 Tumor Marker   Patient's tumor was tested for the following markers: CA-125 Results of the tumor marker test revealed 8.5   04/25/2019 Genetic Testing   Negative genetic testing:  No pathogenic variants detected on the [employer] TumorNext-HRD+CancerNext panel. Testing did identify two variants of uncertain significance (VUS). One germline VUS was detected in the NBN gene, called c.812T>C (p.V271A). One somatic VUS was detected in the CHEK2 gene, called c.1116_1117delCAinsTG. The report date is 04/25/2019.  The TumorNext-HRD panel offered by [employer] [facility] includes analysis of the tumor for somatic variants in the following 11 genes:  ATM, BARD1, BRIP1, CHEK2, MRE11A, NBN, PALB2, RAD51C, RAD51D, BRCA1, BRCA2 (sequencing only). The CancerNext gene panel offered by [employer] includes sequencing and rearrangement analysis of the blood for germline variants in the following 36 genes:   APC, ATM, AXIN2, BARD1, BMPR1A, BRCA1, BRCA2, BRIP1, CDH1, CDK4, CDKN2A, CHEK2, DICER1, HOXB13, EPCAM, GREM1, MLH1, MSH2, MSH3, MSH6, MUTYH, NBN, NF1, NTHL1, PALB2, PMS2, POLD1, POLE, PTEN, RAD51C, RAD51D, RECQL, SMAD4, SMARCA4, STK11, and TP53.        REVIEW OF SYSTEMS:    Constitutional: Denies fevers, chills or abnormal weight loss  Eyes: Denies blurriness of vision Ears, nose, mouth, throat, and face: Denies mucositis or sore throat Respiratory: Denies cough, dyspnea or wheezes Cardiovascular: Denies palpitation, chest discomfort or lower extremity swelling Skin: Denies abnormal skin rashes Lymphatics: Denies new lymphadenopathy or easy bruising Behavioral/Psych: Mood is stable, no new changes  All other systems were reviewed with the patient and are negative.  I have reviewed the past medical history, past surgical history, social history and family history with the patient and they are unchanged from previous note.  ALLERGIES:  has No Known Allergies.  MEDICATIONS:  Current Outpatient Medications  Medication Sig Dispense Refill  . acetaminophen (TYLENOL) 325 MG tablet Take 650 mg by mouth every 6 (six) hours as needed.    Marland Kitchen dexamethasone (DECADRON) 4 MG tablet Take 2 tabs at the night before chemotherapy, every 3 weeks, by mouth 2 tablet 0  . diclofenac Sodium (VOLTAREN) 1 % GEL Apply 4 g topically 4 (four) times daily as needed. 100 g 5  . gabapentin (NEURONTIN) 100 MG capsule Take 1 capsule (100 mg total) by mouth 3 (three) times daily. 90 capsule 11  . ibuprofen (ADVIL) 600 MG tablet Take 600 mg by mouth every 6 (six) hours as needed.    . lidocaine-prilocaine (EMLA) cream Apply to affected area 4 times a day prn 30 g 3  . ondansetron (ZOFRAN) 8 MG tablet Take 1 tablet (8 mg total) by mouth every 8 (eight) hours as needed for refractory nausea / vomiting. Start on day 3 after carboplatin chemo. 30 tablet 1  . prochlorperazine (COMPAZINE) 10 MG tablet Take 1 tablet (10 mg total) by mouth every 6 (six) hours as needed (Nausea or vomiting). 30 tablet 1   No current facility-administered medications for this visit.    PHYSICAL EXAMINATION: ECOG PERFORMANCE STATUS: 1 - Symptomatic but completely ambulatory  Vitals:   05/29/19 0849  BP: 134/72   Pulse: 89  Resp: 18  Temp: 97.8 F (36.6 C)  SpO2: 100%   Filed Weights   05/29/19 0849  Weight: 217 lb 6.4 oz (98.6 kg)    GENERAL:alert, no distress and comfortable SKIN: skin color, texture, turgor are normal, no rashes or significant lesions EYES: normal, Conjunctiva are pink and non-injected, sclera clear OROPHARYNX:no exudate, no erythema and lips, buccal mucosa, and tongue normal  NECK: supple, thyroid normal size, non-tender, without nodularity LYMPH:  no palpable lymphadenopathy in the cervical, axillary or inguinal LUNGS: clear to auscultation and percussion with normal breathing effort HEART: regular rate & rhythm and no murmurs and no lower extremity edema ABDOMEN:abdomen soft, non-tender and normal bowel sounds Musculoskeletal:no cyanosis of digits and no clubbing  NEURO: alert & oriented x 3 with fluent speech, no focal motor/sensory deficits  LABORATORY DATA:  I have reviewed the data as listed    Component Value Date/Time   NA 138 05/29/2019 0825   K 3.8 05/29/2019 0825   CL 104 05/29/2019 0825   CO2 23 05/29/2019 0825   GLUCOSE 231 (H) 05/29/2019 0825   BUN 19 05/29/2019 0825   CREATININE 0.80 05/29/2019 0825   CALCIUM 9.1 05/29/2019 0825   PROT 7.5 05/29/2019 0825   ALBUMIN 3.6 05/29/2019 0825   AST 24 05/29/2019 0825   ALT 36 05/29/2019 0825   ALKPHOS 86 05/29/2019 0825   BILITOT 0.3 05/29/2019 0825   GFRNONAA >60 05/29/2019 0825   GFRAA >60 05/29/2019 0825    No results found for: SPEP, UPEP  Lab Results  Component Value Date   WBC  7.9 05/29/2019   NEUTROABS 7.1 05/29/2019   HGB 10.9 (L) 05/29/2019   HCT 33.0 (L) 05/29/2019   MCV 88.0 05/29/2019   PLT 178 05/29/2019      Chemistry      Component Value Date/Time   NA 138 05/29/2019 0825   K 3.8 05/29/2019 0825   CL 104 05/29/2019 0825   CO2 23 05/29/2019 0825   BUN 19 05/29/2019 0825   CREATININE 0.80 05/29/2019 0825      Component Value Date/Time   CALCIUM 9.1 05/29/2019 0825    ALKPHOS 86 05/29/2019 0825   AST 24 05/29/2019 0825   ALT 36 05/29/2019 0825   BILITOT 0.3 05/29/2019 0825

## 2019-05-30 NOTE — Assessment & Plan Note (Signed)
She tolerated recent chemotherapy better last cycle She still have bone pain, insomnia and neuropathy from treatment She will proceed with treatment as scheduled next week I explained to her why she is not able to work until she has completed her chemotherapy

## 2019-05-30 NOTE — Assessment & Plan Note (Signed)
This is likely related to steroids treatment Recommend reduce oral steroids and IV steroids and conservative approach with antiacid

## 2019-06-07 ENCOUNTER — Other Ambulatory Visit: Payer: Self-pay

## 2019-06-07 ENCOUNTER — Emergency Department (HOSPITAL_COMMUNITY)
Admission: EM | Admit: 2019-06-07 | Discharge: 2019-06-07 | Disposition: A | Discharge status: Home / Self Care | Payer: Managed Care, Other (non HMO) | Attending: Emergency Medicine | Admitting: Emergency Medicine

## 2019-06-07 ENCOUNTER — Encounter (HOSPITAL_COMMUNITY): Payer: Self-pay | Admitting: Emergency Medicine

## 2019-06-07 DIAGNOSIS — Y9259 Other trade areas as the place of occurrence of the external cause: Secondary | ICD-10-CM | POA: Diagnosis not present

## 2019-06-07 DIAGNOSIS — C482 Malignant neoplasm of peritoneum, unspecified: Secondary | ICD-10-CM | POA: Insufficient documentation

## 2019-06-07 DIAGNOSIS — Y9389 Activity, other specified: Secondary | ICD-10-CM | POA: Diagnosis not present

## 2019-06-07 DIAGNOSIS — X102XXA Contact with fats and cooking oils, initial encounter: Secondary | ICD-10-CM | POA: Insufficient documentation

## 2019-06-07 DIAGNOSIS — Z79899 Other long term (current) drug therapy: Secondary | ICD-10-CM | POA: Diagnosis not present

## 2019-06-07 DIAGNOSIS — T22211A Burn of second degree of right forearm, initial encounter: Secondary | ICD-10-CM | POA: Insufficient documentation

## 2019-06-07 DIAGNOSIS — Y990 Civilian activity done for income or pay: Secondary | ICD-10-CM | POA: Insufficient documentation

## 2019-06-07 DIAGNOSIS — T22011A Burn of unspecified degree of right forearm, initial encounter: Secondary | ICD-10-CM | POA: Diagnosis present

## 2019-06-07 MED ORDER — SILVER SULFADIAZINE 1 % EX CREA: 1.0000 "application " | TOPICAL_CREAM | Freq: Two times a day (BID) | CUTANEOUS | 0 refills | Status: AC

## 2019-06-07 MED FILL — SILVADENE 1% CREAM: 1 | 5 days supply | Qty: 14 | Fill #0

## 2019-06-07 NOTE — ED Triage Notes (Signed)
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 Has burns to medial upper arm with blistering and some scattered burns to right lower forearm and hand. Pt has Vaseline covered on the burns.

## 2019-06-07 NOTE — ED Provider Notes (Signed)
[facility] DEPT Provider Note   CSN: [id number] Arrival date & time: 06/07/19  1148     History Chief Complaint  Patient presents with  . Burn    Caitlin [name] is a 44 y.o. female with a history of peritoneal carcinoma with ongoing chemotherapy presented to emergency department with a burn to her right arm.  She reports she was working today and grease splashed up onto her right hand and right arm.  She denies that she got burns anywhere else on the body.  She reports that her tetanus IS up to date within the past 5 years.  Denies fevers, chills.  HPI     Past Medical History:  Diagnosis Date  . Bacterial vaginitis   . Cancer [facility])    Primary peritoneal carcinoma  . Depression   . Family history of cervical cancer   . Family history of ovarian cancer   . Family history of throat cancer   . Hypercholesterolemia 05/17/2017    Patient Active Problem List   Diagnosis Date Noted  . GERD (gastroesophageal reflux disease) 05/30/2019  . Physical debility 05/05/2019  . Genetic testing 04/25/2019  . Peripheral neuropathy due to chemotherapy ([place]) 04/17/2019  . Paresthesia 03/29/2019  . Chemotherapy adverse reaction 03/29/2019  . History of bariatric surgery 03/29/2019  . Neck pain 03/29/2019  . Bone pain 03/14/2019  . Insomnia disorder 03/14/2019  . Family history of ovarian cancer   . Family history of throat cancer   . Family history of cervical cancer   . Goals of care, counseling/discussion 02/23/2019  . Primary peritoneal adenocarcinoma ([place]) 02/20/2019  . Pain in joint, pelvic region and thigh 01/19/2019  . 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 ABDOMINAL HYSTERECTOMY    . DIAGNOSTIC LAPAROSCOPY    . DILATION AND CURETTAGE OF UTERUS    . GASTRIC BYPASS  2017  . HYSTEROSCOPY WITH D & C Bilateral 06/07/2013   Procedure: DILATATION AND CURETTAGE /HYSTEROSCOPY with bilateral  tubal cathetrization  ;  Surgeon: Governor Specking, MD;  Location: [facility];  Service: Gynecology;  Laterality: Bilateral;  . IR IMAGING GUIDED PORT INSERTION  03/03/2019  . LAPAROSCOPY Left 06/07/2013   Procedure: LAPAROSCOPY DIAGNOSTIC with aspiration of left peritubal cyst  ;  Surgeon: Governor Specking, MD;  Location: [facility];  Service: Gynecology;  Laterality: Left;  . LAPAROTOMY Right 06/07/2013   Procedure: LAPAROTOMY with bilateral corneal segmental salpingectomy right corneal anastamosis;  Surgeon: Governor Specking, MD;  Location: [facility];  Service: Gynecology;  Laterality: Right;     OB History   No obstetric history on file.     Family History  Problem Relation Age of Onset  . Ovarian cancer Mother 39  . Diabetes Paternal Grandmother   . Cancer Father        unknown type - possibly skin  . HIV Father   . Heart Problems Maternal Grandfather   . Cancer Paternal Grandfather        unknown type, diagnosed older than 23  . Cancer Maternal Uncle        unknown type, diagnosed older than 46  . Throat cancer Maternal Uncle        diagnosed older than 8s, smoker  . Cervical cancer Cousin        paternal cousin  . Breast cancer Neg Hx   . Colon cancer Neg Hx     Social History   Tobacco Use  .  Smoking status: Never Smoker  . Smokeless tobacco: Never Used  Substance Use Topics  . Alcohol use: No  . Drug use: No    Home Medications Prior to Admission medications   Medication Sig Start Date End Date Taking? Authorizing Provider  acetaminophen (TYLENOL) 325 MG tablet Take 650 mg by mouth every 6 (six) hours as needed.    [provider]  dexamethasone (DECADRON) 4 MG tablet Take 2 tabs at the night before chemotherapy, every 3 weeks, by mouth 05/29/19   Heath Lark, MD  diclofenac Sodium (VOLTAREN) 1 % GEL Apply 4 g topically 4 (four) times daily as needed. 04/03/19   Marcial Pacas, MD  gabapentin (NEURONTIN) 100 MG capsule Take 1 capsule (100 mg total) by mouth 3 (three)  times daily. 03/29/19   Marcial Pacas, MD  ibuprofen (ADVIL) 600 MG tablet Take 600 mg by mouth every 6 (six) hours as needed.    [provider]  lidocaine-prilocaine (EMLA) cream Apply to affected area 4 times a day prn 03/31/19   Marcial Pacas, MD  ondansetron (ZOFRAN) 8 MG tablet Take 1 tablet (8 mg total) by mouth every 8 (eight) hours as needed for refractory nausea / vomiting. Start on day 3 after carboplatin chemo. 02/23/19   Heath Lark, MD  prochlorperazine (COMPAZINE) 10 MG tablet Take 1 tablet (10 mg total) by mouth every 6 (six) hours as needed (Nausea or vomiting). 02/23/19   Heath Lark, MD  silver sulfADIAZINE (SILVADENE) 1 % cream Apply 1 application topically 2 (two) times daily for 7 days. 06/07/19 06/14/19  Wyvonnia Dusky, MD    Allergies    Patient has no known allergies.  Review of Systems   Review of Systems  Constitutional: Negative for chills and fever.  Musculoskeletal: Positive for arthralgias and myalgias.  Skin: Positive for rash and wound.  Neurological: Negative for weakness and numbness.  Psychiatric/Behavioral: Negative for agitation and confusion.  All other systems reviewed and are negative.   Physical Exam Updated Vital Signs LMP 01/23/2019 (Within Weeks)   Physical Exam Vitals and nursing note reviewed.  Constitutional:      General: She is not in acute distress.    Appearance: She is well-developed.  HENT:     Head: Normocephalic and atraumatic.  Eyes:     Conjunctiva/sclera: Conjunctivae normal.  Cardiovascular:     Rate and Rhythm: Normal rate and regular rhythm.     Pulses: Normal pulses.  Pulmonary:     Effort: Pulmonary effort is normal. No respiratory distress.  Musculoskeletal:     Cervical back: Neck supple.     Comments: 2nd degree burns as noted in photos below Also has small 1st or 2nd degree burn over proximal right thumb (not imaged)  Skin:    General: Skin is warm and dry.  Neurological:     Mental Status: She is  alert.  Psychiatric:        Mood and Affect: Mood normal.        Behavior: Behavior normal.         ED Results / Procedures / Treatments   Labs (all labs ordered are listed, but only abnormal results are displayed) Labs Reviewed - No data to display  EKG None  Radiology No results found.  Procedures Procedures (including critical care time)  Medications Ordered in ED Medications - No data to display  ED Course  I have reviewed the triage vital signs and the nursing notes.  Pertinent labs & imaging results that  were available during my care of the patient were reviewed by me and considered in my medical decision making (see chart for details).  44 yo female here with 2nd degree burns from grease while cooking at work this morning.  She applied vaseline to them.  Some blistering noted in the proximal arm.  Otherwise no signs or symptoms of cellulitis or infection.  Tetanus is reportedly up to date.  There is some 1st or 2nd degree burn to the proximal thumb but it is not circumferential and I do not believe she is at risk for compartment syndrome from these injuries.  We'll prescribe silvadene for several days, advised aloe, have her follow up with her PCP in 1 week, and offered [facility] burn center information as well.  She verbalized understanding.  Final Clinical Impression(s) / ED Diagnoses Final diagnoses:  Partial thickness burn of right forearm, initial encounter    Rx / DC Orders ED Discharge Orders         Ordered    silver sulfADIAZINE (SILVADENE) 1 % cream  2 times daily     06/07/19 1225           Wyvonnia Dusky, MD 06/07/19 [phone]

## 2019-06-07 NOTE — Discharge Instructions (Addendum)
You should schedule a follow up appointment in your primary care doctor's office in 1 week to have your burn looked at again.  If your doctor wants you to see a specialist, you can make an appointment at the [facility], [place], [place] [address] Phone: [phone]  You can put silvadene cream on your wound as instructed for 7 days.  Afterwards you can switch to Aloe cream.

## 2019-06-07 NOTE — ED Notes (Signed)
Provided patient some non-stick telfa pads, kerlix, and surgical tape to dress wound once she applies prescription cream on wound.

## 2019-06-14 ENCOUNTER — Telehealth: Payer: Self-pay | Admitting: Oncology

## 2019-06-14 NOTE — Telephone Encounter (Signed)
I cancelled Monday and moved to Friday

## 2019-06-14 NOTE — Telephone Encounter (Signed)
Caitlin [name] burned her arm with oil when she was cooking.  She said it is hurting and she has started the antibiotics.  She would like to reschedule her chemo to next Friday, 06/23/19.

## 2019-06-14 NOTE — Telephone Encounter (Signed)
It depends on the state of healing Sounds quite serious on the chart If she is OK, we can delay her treatment by 1 week or change it to Friday to allow more time of recovery

## 2019-06-14 NOTE — Telephone Encounter (Signed)
Caitlin [name] left a message saying that she went to her ER on 06/07/19 for a second degree burn on her arm.  She had an ER follow up yesterday and was started on doxycycline 100 mg BID and bactroban ointment for a secondary infection. She wants to make sure this is ok to take with her next chemotherapy infusion on Monday.

## 2019-06-15 ENCOUNTER — Encounter: Payer: Self-pay | Admitting: Hematology and Oncology

## 2019-06-16 ENCOUNTER — Telehealth: Payer: Self-pay | Admitting: Hematology and Oncology

## 2019-06-16 NOTE — Telephone Encounter (Signed)
Scheduled appt per 12/7 sch message - unable to reach pt . Left message with appt date and time   

## 2019-06-19 ENCOUNTER — Inpatient Hospital Stay: Payer: 59

## 2019-06-19 ENCOUNTER — Inpatient Hospital Stay: Payer: 59 | Admitting: Hematology and Oncology

## 2019-06-21 ENCOUNTER — Other Ambulatory Visit: Payer: Self-pay | Admitting: Hematology and Oncology

## 2019-06-22 MED ORDER — DEXAMETHASONE 4 MG PO TABS: ORAL_TABLET | ORAL | 0 refills | Status: DC

## 2019-06-22 MED FILL — DEXAMETHASONE 4 MG TABLET: 4 | 1 days supply | Qty: 2 | Fill #0

## 2019-06-23 ENCOUNTER — Other Ambulatory Visit: Payer: Self-pay

## 2019-06-23 ENCOUNTER — Inpatient Hospital Stay (HOSPITAL_BASED_OUTPATIENT_CLINIC_OR_DEPARTMENT_OTHER): Payer: 59 | Admitting: Hematology and Oncology

## 2019-06-23 ENCOUNTER — Encounter: Payer: Self-pay | Admitting: Hematology and Oncology

## 2019-06-23 ENCOUNTER — Inpatient Hospital Stay: Payer: 59 | Attending: Gynecologic Oncology

## 2019-06-23 ENCOUNTER — Inpatient Hospital Stay: Payer: 59

## 2019-06-23 VITALS — BP 125/74 | HR 82 | Temp 97.8°F | Resp 18 | Ht 61.0 in | Wt 218.0 lb

## 2019-06-23 DIAGNOSIS — G620 Drug-induced polyneuropathy: Secondary | ICD-10-CM

## 2019-06-23 DIAGNOSIS — Z5111 Encounter for antineoplastic chemotherapy: Secondary | ICD-10-CM | POA: Diagnosis not present

## 2019-06-23 DIAGNOSIS — C482 Malignant neoplasm of peritoneum, unspecified: Secondary | ICD-10-CM | POA: Diagnosis present

## 2019-06-23 DIAGNOSIS — T300 Burn of unspecified body region, unspecified degree: Secondary | ICD-10-CM | POA: Insufficient documentation

## 2019-06-23 DIAGNOSIS — Z90722 Acquired absence of ovaries, bilateral: Secondary | ICD-10-CM | POA: Insufficient documentation

## 2019-06-23 DIAGNOSIS — Z79899 Other long term (current) drug therapy: Secondary | ICD-10-CM | POA: Diagnosis not present

## 2019-06-23 DIAGNOSIS — T451X5A Adverse effect of antineoplastic and immunosuppressive drugs, initial encounter: Secondary | ICD-10-CM

## 2019-06-23 DIAGNOSIS — Z90710 Acquired absence of both cervix and uterus: Secondary | ICD-10-CM | POA: Insufficient documentation

## 2019-06-23 LAB — CBC WITH DIFFERENTIAL (CANCER CENTER ONLY)
Abs Immature Granulocytes: 0.02 10*3/uL (ref 0.00–0.07)
Basophils Absolute: 0 10*3/uL (ref 0.0–0.1)
Basophils Relative: 0 %
Eosinophils Absolute: 0 10*3/uL (ref 0.0–0.5)
Eosinophils Relative: 0 %
HCT: 32 % — ABNORMAL LOW (ref 36.0–46.0)
Hemoglobin: 10.6 g/dL — ABNORMAL LOW (ref 12.0–15.0)
Immature Granulocytes: 0 %
Lymphocytes Relative: 9 %
Lymphs Abs: 0.5 10*3/uL — ABNORMAL LOW (ref 0.7–4.0)
MCH: 29.7 pg (ref 26.0–34.0)
MCHC: 33.1 g/dL (ref 30.0–36.0)
MCV: 89.6 fL (ref 80.0–100.0)
Monocytes Absolute: 0.2 10*3/uL (ref 0.1–1.0)
Monocytes Relative: 3 %
Neutro Abs: 5.3 10*3/uL (ref 1.7–7.7)
Neutrophils Relative %: 88 %
Platelet Count: 214 10*3/uL (ref 150–400)
RBC: 3.57 MIL/uL — ABNORMAL LOW (ref 3.87–5.11)
RDW: 14.6 % (ref 11.5–15.5)
WBC Count: 6.1 10*3/uL (ref 4.0–10.5)
nRBC: 0 % (ref 0.0–0.2)

## 2019-06-23 LAB — CMP (CANCER CENTER ONLY)
ALT: 33 U/L (ref 0–44)
AST: 25 U/L (ref 15–41)
Albumin: 3.6 g/dL (ref 3.5–5.0)
Alkaline Phosphatase: 78 U/L (ref 38–126)
Anion gap: 10 (ref 5–15)
BUN: 17 mg/dL (ref 6–20)
CO2: 25 mmol/L (ref 22–32)
Calcium: 9.5 mg/dL (ref 8.9–10.3)
Chloride: 107 mmol/L (ref 98–111)
Creatinine: 0.75 mg/dL (ref 0.44–1.00)
GFR, Est AFR Am: >60 mL/min (ref >60)
GFR, Estimated: >60 mL/min (ref >60)
Glucose, Bld: 133 mg/dL — ABNORMAL HIGH (ref 70–99)
Potassium: 3.6 mmol/L (ref 3.5–5.1)
Sodium: 142 mmol/L (ref 135–145)
Total Bilirubin: 0.3 mg/dL (ref 0.3–1.2)
Total Protein: 7.5 g/dL (ref 6.5–8.1)

## 2019-06-23 MED ORDER — FAMOTIDINE IN NACL 20-0.9 MG/50ML-% IV SOLN
INTRAVENOUS | Status: AC
  Filled 2019-06-23: qty 50

## 2019-06-23 MED ORDER — SODIUM CHLORIDE 0.9 % IV SOLN
Freq: Once | INTRAVENOUS | Status: AC
  Filled 2019-06-23: qty 250

## 2019-06-23 MED ORDER — SODIUM CHLORIDE 0.9% FLUSH
10.0000 mL | INTRAVENOUS | Status: DC | PRN
  Administered 2019-06-23: 10 mL
  Filled 2019-06-23: qty 10

## 2019-06-23 MED ORDER — SODIUM CHLORIDE 0.9 % IV SOLN
140.0000 mg/m2 | Freq: Once | INTRAVENOUS | Status: AC
  Administered 2019-06-23: 294 mg via INTRAVENOUS
  Filled 2019-06-23: qty 49

## 2019-06-23 MED ORDER — DIPHENHYDRAMINE HCL 50 MG/ML IJ SOLN
50.0000 mg | Freq: Once | INTRAMUSCULAR | Status: AC
  Administered 2019-06-23: 12:00:00 50 mg via INTRAVENOUS

## 2019-06-23 MED ORDER — SODIUM CHLORIDE 0.9 % IV SOLN
750.0000 mg | Freq: Once | INTRAVENOUS | Status: AC
  Administered 2019-06-23: 16:00:00 750 mg via INTRAVENOUS
  Filled 2019-06-23: qty 75

## 2019-06-23 MED ORDER — SODIUM CHLORIDE 0.9 % IV SOLN
Freq: Once | INTRAVENOUS | Status: AC
  Filled 2019-06-23: qty 5

## 2019-06-23 MED ORDER — SODIUM CHLORIDE 0.9% FLUSH
10.0000 mL | Freq: Once | INTRAVENOUS | Status: AC
  Administered 2019-06-23: 10 mL
  Filled 2019-06-23: qty 10

## 2019-06-23 MED ORDER — PALONOSETRON HCL INJECTION 0.25 MG/5ML
0.2500 mg | Freq: Once | INTRAVENOUS | Status: AC
  Administered 2019-06-23: 12:00:00 0.25 mg via INTRAVENOUS

## 2019-06-23 MED ORDER — FAMOTIDINE IN NACL 20-0.9 MG/50ML-% IV SOLN
20.0000 mg | Freq: Once | INTRAVENOUS | Status: AC
  Administered 2019-06-23: 20 mg via INTRAVENOUS

## 2019-06-23 MED ORDER — PALONOSETRON HCL INJECTION 0.25 MG/5ML
INTRAVENOUS | Status: AC
  Filled 2019-06-23: qty 5

## 2019-06-23 MED ORDER — HEPARIN SOD (PORK) LOCK FLUSH 100 UNIT/ML IV SOLN
500.0000 [IU] | Freq: Once | INTRAVENOUS | Status: AC | PRN
  Administered 2019-06-23: 17:00:00 500 [IU]
  Filled 2019-06-23: qty 5

## 2019-06-23 MED ORDER — DIPHENHYDRAMINE HCL 50 MG/ML IJ SOLN
INTRAMUSCULAR | Status: AC
  Filled 2019-06-23: qty 1

## 2019-06-23 NOTE — Assessment & Plan Note (Signed)
She had partial skin thickness injury from recent burn It is healing well It does not look infected We discussed the risk and benefits of pursuing chemotherapy now versus delaying it and she will accept risk of delay wound healing She will continue conservative approach with skin care at home

## 2019-06-23 NOTE — Assessment & Plan Note (Signed)
She will proceed with final treatment today I did warn her about the possibility of delayed wound healing from her recent skin injury but she would like to proceed I recommend repeat blood work and CT imaging in a month for further follow-up
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
## 2019-06-23 NOTE — Assessment & Plan Note (Signed)
she has mild peripheral neuropathy, likely related to side effects of treatment. She will continue with similar dose adjustment as before

## 2019-06-23 NOTE — Patient Instructions (Signed)
[facility] Discharge Instructions for Patients Receiving Chemotherapy  Today you received the following chemotherapy agents: Taxol/Carbo  To help prevent nausea and vomiting after your treatment, we encourage you to take your nausea medication as directed.   If you develop nausea and vomiting that is not controlled by your nausea medication, call the clinic.   BELOW ARE SYMPTOMS THAT SHOULD BE REPORTED IMMEDIATELY:  *FEVER GREATER THAN 100.5 F  *CHILLS WITH OR WITHOUT FEVER  NAUSEA AND VOMITING THAT IS NOT CONTROLLED WITH YOUR NAUSEA MEDICATION  *UNUSUAL SHORTNESS OF BREATH  *UNUSUAL BRUISING OR BLEEDING  TENDERNESS IN MOUTH AND THROAT WITH OR WITHOUT PRESENCE OF ULCERS  *URINARY PROBLEMS  *BOWEL PROBLEMS  UNUSUAL RASH Items with * indicate a potential emergency and should be followed up as soon as possible.  Feel free to call the clinic should you have any questions or concerns. The clinic phone number is [phone].  Please show the CHEMO ALERT CARD at check-in to the Emergency Department and triage nurse.   

## 2019-06-23 NOTE — Patient Instructions (Signed)

## 2019-06-26 ENCOUNTER — Telehealth: Payer: Self-pay | Admitting: Hematology and Oncology

## 2019-06-26 NOTE — Telephone Encounter (Signed)
I talk with patient regarding schedule  

## 2019-06-27 LAB — CA 125: Cancer Antigen (CA) 125: 7.6 U/mL (ref 0.0–38.1)

## 2019-07-03 ENCOUNTER — Ambulatory Visit: Payer: Managed Care, Other (non HMO) | Admitting: Neurology

## 2019-07-20 ENCOUNTER — Inpatient Hospital Stay: Payer: 59 | Attending: Gynecologic Oncology

## 2019-07-20 ENCOUNTER — Encounter (HOSPITAL_COMMUNITY): Payer: Self-pay

## 2019-07-20 ENCOUNTER — Other Ambulatory Visit: Payer: Self-pay

## 2019-07-20 ENCOUNTER — Inpatient Hospital Stay: Payer: 59

## 2019-07-20 ENCOUNTER — Ambulatory Visit (HOSPITAL_COMMUNITY)
Admission: RE | Admit: 2019-07-20 | Discharge: 2019-07-20 | Disposition: A | Discharge status: Home / Self Care | Payer: 59 | Source: Ambulatory Visit | Attending: Hematology and Oncology | Admitting: Hematology and Oncology

## 2019-07-20 DIAGNOSIS — Z791 Long term (current) use of non-steroidal anti-inflammatories (NSAID): Secondary | ICD-10-CM | POA: Insufficient documentation

## 2019-07-20 DIAGNOSIS — C482 Malignant neoplasm of peritoneum, unspecified: Secondary | ICD-10-CM

## 2019-07-20 DIAGNOSIS — Z79899 Other long term (current) drug therapy: Secondary | ICD-10-CM | POA: Insufficient documentation

## 2019-07-20 DIAGNOSIS — Z90710 Acquired absence of both cervix and uterus: Secondary | ICD-10-CM | POA: Insufficient documentation

## 2019-07-20 DIAGNOSIS — Z9221 Personal history of antineoplastic chemotherapy: Secondary | ICD-10-CM | POA: Insufficient documentation

## 2019-07-20 DIAGNOSIS — G620 Drug-induced polyneuropathy: Secondary | ICD-10-CM | POA: Insufficient documentation

## 2019-07-20 DIAGNOSIS — R5381 Other malaise: Secondary | ICD-10-CM | POA: Insufficient documentation

## 2019-07-20 DIAGNOSIS — Z90722 Acquired absence of ovaries, bilateral: Secondary | ICD-10-CM | POA: Insufficient documentation

## 2019-07-20 LAB — CBC WITH DIFFERENTIAL (CANCER CENTER ONLY)
Abs Immature Granulocytes: 0.02 10*3/uL (ref 0.00–0.07)
Basophils Absolute: 0 10*3/uL (ref 0.0–0.1)
Basophils Relative: 0 %
Eosinophils Absolute: 0.1 10*3/uL (ref 0.0–0.5)
Eosinophils Relative: 2 %
HCT: 32.1 % — ABNORMAL LOW (ref 36.0–46.0)
Hemoglobin: 10.6 g/dL — ABNORMAL LOW (ref 12.0–15.0)
Immature Granulocytes: 0 %
Lymphocytes Relative: 20 %
Lymphs Abs: 1 10*3/uL (ref 0.7–4.0)
MCH: 30.1 pg (ref 26.0–34.0)
MCHC: 33 g/dL (ref 30.0–36.0)
MCV: 91.2 fL (ref 80.0–100.0)
Monocytes Absolute: 0.4 10*3/uL (ref 0.1–1.0)
Monocytes Relative: 9 %
Neutro Abs: 3.3 10*3/uL (ref 1.7–7.7)
Neutrophils Relative %: 69 %
Platelet Count: 174 10*3/uL (ref 150–400)
RBC: 3.52 MIL/uL — ABNORMAL LOW (ref 3.87–5.11)
RDW: 14.1 % (ref 11.5–15.5)
WBC Count: 4.8 10*3/uL (ref 4.0–10.5)
nRBC: 0 % (ref 0.0–0.2)

## 2019-07-20 LAB — CMP (CANCER CENTER ONLY)
ALT: 32 U/L (ref 0–44)
AST: 33 U/L (ref 15–41)
Albumin: 3.5 g/dL (ref 3.5–5.0)
Alkaline Phosphatase: 75 U/L (ref 38–126)
Anion gap: 8 (ref 5–15)
BUN: 17 mg/dL (ref 6–20)
CO2: 27 mmol/L (ref 22–32)
Calcium: 9.1 mg/dL (ref 8.9–10.3)
Chloride: 105 mmol/L (ref 98–111)
Creatinine: 0.69 mg/dL (ref 0.44–1.00)
GFR, Est AFR Am: >60 mL/min (ref >60)
GFR, Estimated: >60 mL/min (ref >60)
Glucose, Bld: 102 mg/dL — ABNORMAL HIGH (ref 70–99)
Potassium: 3.8 mmol/L (ref 3.5–5.1)
Sodium: 140 mmol/L (ref 135–145)
Total Bilirubin: 0.4 mg/dL (ref 0.3–1.2)
Total Protein: 7.1 g/dL (ref 6.5–8.1)

## 2019-07-20 MED ORDER — SODIUM CHLORIDE (PF) 0.9 % IJ SOLN
INTRAMUSCULAR | Status: AC
  Filled 2019-07-20: qty 50

## 2019-07-20 MED ORDER — IOHEXOL 300 MG/ML  SOLN
100.0000 mL | Freq: Once | INTRAMUSCULAR | Status: AC | PRN
  Administered 2019-07-20: 100 mL via INTRAVENOUS

## 2019-07-20 MED ORDER — SODIUM CHLORIDE 0.9% FLUSH
10.0000 mL | Freq: Once | INTRAVENOUS | Status: AC
  Administered 2019-07-20: 10 mL
  Filled 2019-07-20: qty 10

## 2019-07-20 MED ORDER — HEPARIN SOD (PORK) LOCK FLUSH 100 UNIT/ML IV SOLN
500.0000 [IU] | Freq: Once | INTRAVENOUS | Status: AC
  Administered 2019-07-20: 500 [IU] via INTRAVENOUS

## 2019-07-20 MED ORDER — HEPARIN SOD (PORK) LOCK FLUSH 100 UNIT/ML IV SOLN
INTRAVENOUS | Status: AC
  Filled 2019-07-20: qty 5

## 2019-07-21 ENCOUNTER — Inpatient Hospital Stay (HOSPITAL_BASED_OUTPATIENT_CLINIC_OR_DEPARTMENT_OTHER): Payer: 59 | Admitting: Hematology and Oncology

## 2019-07-21 ENCOUNTER — Other Ambulatory Visit: Payer: Self-pay

## 2019-07-21 ENCOUNTER — Encounter: Payer: Self-pay | Admitting: Hematology and Oncology

## 2019-07-21 VITALS — BP 135/65 | HR 105 | Temp 98.0°F | Resp 18 | Ht 61.0 in | Wt 219.8 lb

## 2019-07-21 DIAGNOSIS — T451X5A Adverse effect of antineoplastic and immunosuppressive drugs, initial encounter: Secondary | ICD-10-CM | POA: Diagnosis not present

## 2019-07-21 DIAGNOSIS — G620 Drug-induced polyneuropathy: Secondary | ICD-10-CM

## 2019-07-21 DIAGNOSIS — Z90722 Acquired absence of ovaries, bilateral: Secondary | ICD-10-CM | POA: Diagnosis not present

## 2019-07-21 DIAGNOSIS — R5381 Other malaise: Secondary | ICD-10-CM

## 2019-07-21 DIAGNOSIS — Z79899 Other long term (current) drug therapy: Secondary | ICD-10-CM | POA: Diagnosis not present

## 2019-07-21 DIAGNOSIS — Z9221 Personal history of antineoplastic chemotherapy: Secondary | ICD-10-CM | POA: Diagnosis not present

## 2019-07-21 DIAGNOSIS — Z791 Long term (current) use of non-steroidal anti-inflammatories (NSAID): Secondary | ICD-10-CM | POA: Diagnosis not present

## 2019-07-21 DIAGNOSIS — C482 Malignant neoplasm of peritoneum, unspecified: Secondary | ICD-10-CM

## 2019-07-21 DIAGNOSIS — Z90710 Acquired absence of both cervix and uterus: Secondary | ICD-10-CM | POA: Diagnosis not present

## 2019-07-21 LAB — CA 125: Cancer Antigen (CA) 125: 6.3 U/mL (ref 0.0–38.1)

## 2019-07-21 NOTE — Assessment & Plan Note (Signed)
She still have some fatigue from recent treatment I recommend she takes the rest of the month of but she should be able to return back to work on April 1.

## 2019-07-21 NOTE — Progress Notes (Signed)
[place] OFFICE PROGRESS NOTE  Patient Care Team: [place]. 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 Skeet Latch The patient is educated to watch out for signs and symptoms of cancer recurrence There is no contraindication for her to proceed with Covid vaccine She is still recovering from side effects of therapy I recommend she takes a few more weeks of to heal from recent treatment but she should be able to return back to work on April 1  Peripheral neuropathy due to chemotherapy [facility]) She has mild neuropathy but it is overall improving I recommend she continues taking gabapentin In a few months time, I anticipate her neuropathy will improve and by then I recommend slow taper of gabapentin  Physical debility She still have some fatigue from recent treatment I recommend she takes the rest of the month of but she should be able to return back to work on April 1.   Orders Placed This Encounter  Procedures  . IR REMOVAL TUN ACCESS W/ PORT W/O FL MOD SED    Standing Status:   Future    Standing Expiration Date:   09/19/2020    Order Specific Question:   Reason for exam:    Answer:   chemo is finished, ok to remove port    Order Specific Question:   Preferred Imaging Location?    Answer:   [facility]    All questions were answered. The patient knows to call the clinic with any problems, questions or concerns. The total time spent in the appointment was 20 minutes encounter with patients including review of chart and various tests results, discussions about plan of care and coordination of care plan   Heath Lark, MD 07/21/2019 9:54 AM  INTERVAL HISTORY: Please see below  for problem oriented charting. She returns for further follow-up with her husband for further follow-up She has some mild fatigue from recent treatment She has some mild residual neuropathy but overall stable  SUMMARY OF ONCOLOGIC HISTORY: Oncology History Overview Note  Negative genetics Endometrioid   Primary peritoneal adenocarcinoma ([place])  11/24/2018 Imaging   CT chest with IV contrast elsewhere Stable small lung nodules, the largest is in the left lower lobe measuring 6 mm. These are unchanged from November 2017 and are benign in nature. No new nodules.    01/03/2019 Imaging   MRI pelvis Uterus: The uterus measures 5.8 x 9.3 x 5.8 cm. Unremarkable ENDOMETRIUM. Multiple uterine body intramural fibroids measuring up to 14 mm in size.  Mild endometrial fluid.   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 - Carcinoma appears to be arising in adnexal soft tissue, considered most consistent with stage pT2 (see comment) - Ovary with physiologic changes and and fallopian tube with paratubal cyst and surface adhesions; no definite parenchymal involvement by carcinoma identified - See synoptic report and comment  B: Lymph nodes, right common iliac, lymphadenectomy - Seven lymph nodes with no metastatic carcinoma identified (0/7)  C: Uterus with cervix and left ovary and fallopian tube, hysterectomy and left salpingo-oophorectomy Cervix: Ectocervix and endocervix with no dysplasia or malignancy identified Endometrium: Endometrium with  hemorrhage and weakly proliferative features; no hyperplasia, atypia, or malignancy identified Myometrium: Adenomyosis and benign leiomyomata measuring up to 1.1 cm Ovary, left: Hemorrhagic follicular cyst, small serous inclusions, and physiologic changes Fallopian tube, left: Paratubal cysts and no malignancy identified  D: Lymph nodes,  right pelvic, lymphadenectomy - Nine lymph nodes with no metastatic carcinoma identified (0/9)  E: Lymph nodes, left pelvic, lymphadenectomy - Three lymph nodes with no metastatic carcinoma identified (0/3)  F: Peritoneum, left abdominal, biopsy - Fibroadipose tissue with no malignancy identified  G: Peritoneum, right pelvic, biopsy - Fibroadipose tissue with no malignancy identified  H: Peritoneum, right abdominal, biopsy - Fibroadipose tissue with no malignancy identified  I: Peritoneum, left pelvic, biopsy - Fibroadipose tissue with no malignancy identified  J: Peritoneum, posterior cul de sac, biopsy - Fibroadipose tissue with calcifications and no malignancy identified  K: Peritoneum, anterior cul de sac, biopsy - Fibroadipose tissue with no malignancy identified  L: Omentum, omentectomy - Adipose and fibrovascular tissue consistent with omentum  - No malignancy identified    02/08/2019 Surgery   Preoperative Diagnoses: Adnexal mass, Obesity BMI 40  Postoperative Diagnoses: Right adnexal mass, fallopian tube adenocarcinoma; Obesity BMI 40  Procedures: Diagnostic laparoscopy, Robotic-assisted total laparoscopic hysterectomy, bilateral salpingo-oophorectomy, bilateral pelvic lymphadenectomy, right common iliac lymphadenectomy, peritoneal biopsies, minilaparotomy for infragastric ometectomy  Surgeon: Janie Morning, MD, PhD  Findings: Normal upper abdominal survey: normal liver surface, diaphragm and stomach, normal appearing small and large bowel, normal omentum. No evidence of peritoneal disease or carcinomatosis. Small amount of ascites in the posterior cul-de-sac upon entry. Enlarged right adnexal mass (~8cm), appears to be arising from the fallopian tube with an otherwise normal appearing ovary. There was no intraoperative, intraperitoneal rupture of the mass; controlled removal of the mass occurred extraperitoneally in a bag. Normal appearing uterus and left adnexa. Normal  appearing omentum. Patient with a short mesentery precluding adequate visualization of the aorta. Small nodules in the posterior cul-de-sac, biopsied; no additional peritoneal disease. R0 resection.    02/20/2019 Cancer Staging   Staging form: Ovary, Fallopian Tube, and Primary Peritoneal Carcinoma, AJCC 8th Edition - Pathologic: Stage II (pT2, pN0, cM0) - Signed by Heath Lark, MD on 02/20/2019   02/28/2019 Tumor Marker   Patient's tumor was tested for the following markers: CA-125 Results of the tumor marker test revealed 26.8.   03/03/2019 Procedure   Successful placement of a right IJ approach Power Port with ultrasound and fluoroscopic guidance. The catheter is ready for use.   03/06/2019 -  Chemotherapy   The patient had carboplatin and taxol for chemotherapy treatment.     03/27/2019 Tumor Marker   Patient's tumor was tested for the following markers: CA-125. Results of the tumor marker test revealed 8.8   04/17/2019 Tumor Marker   Patient's tumor was tested for the following markers: CA-125 Results of the tumor marker test revealed 8.5   04/25/2019 Genetic Testing   Negative genetic testing:  No pathogenic variants detected on the [facility] TumorNext-HRD+CancerNext panel. Testing did identify two variants of uncertain significance (VUS). One germline VUS was detected in the NBN gene, called c.812T>C (p.V271A). One somatic VUS was detected in the CHEK2 gene, called c.[id number]. The report date is 04/25/2019.  The TumorNext-HRD panel offered by [facility] includes analysis of the tumor for somatic variants in the following 11 genes:  ATM, BARD1, BRIP1, CHEK2, MRE11A, NBN, PALB2, RAD51C, RAD51D, BRCA1, BRCA2 (sequencing only). The CancerNext gene panel offered by [facility] includes sequencing and rearrangement analysis of the  blood for germline variants in the following 36 genes:   APC, ATM, AXIN2, BARD1, BMPR1A, BRCA1, BRCA2, BRIP1, CDH1, CDK4, CDKN2A, CHEK2, DICER1,  HOXB13, EPCAM, GREM1, MLH1, MSH2, MSH3, MSH6, MUTYH, NBN, NF1, NTHL1, PALB2, PMS2, POLD1, POLE, PTEN, RAD51C, RAD51D, RECQL, SMAD4, SMARCA4, STK11, and TP53.      06/23/2019 Tumor Marker   Patient's tumor was tested for the following markers: CA-125 Results of the tumor marker test revealed 7.6   07/20/2019 Imaging   1. Status post interval hysterectomy and bilateral oophorectomy as well as omentectomy. 2. No CT evidence of nodal or distant metastatic disease within the abdomen or pelvis. 3. Hepatic steatosis.   07/20/2019 Tumor Marker   Patient's tumor was tested for the following markers: CA-125 Results of the tumor marker test revealed 6.3.     REVIEW OF SYSTEMS:   Constitutional: Denies fevers, chills or abnormal weight loss Eyes: Denies blurriness of vision Ears, nose, mouth, throat, and face: Denies mucositis or sore throat Respiratory: Denies cough, dyspnea or wheezes Cardiovascular: Denies palpitation, chest discomfort or lower extremity swelling Gastrointestinal:  Denies nausea, heartburn or change in bowel habits Skin: Denies abnormal skin rashes Lymphatics: Denies new lymphadenopathy or easy bruising Behavioral/Psych: Mood is stable, no new changes  All other systems were reviewed with the patient and are negative.  I have reviewed the past medical history, past surgical history, social history and family history with the patient and they are unchanged from previous note.  ALLERGIES:  has No Known Allergies.  MEDICATIONS:  Current Outpatient Medications  Medication Sig Dispense Refill  . acetaminophen (TYLENOL) 325 MG tablet Take 650 mg by mouth every 6 (six) hours as needed.    . diclofenac Sodium (VOLTAREN) 1 % GEL Apply 4 g topically 4 (four) times daily as needed. 100 g 5  . gabapentin (NEURONTIN) 100 MG capsule Take 1 capsule (100 mg total) by mouth 3 (three) times daily. 90 capsule 11  . ibuprofen (ADVIL) 600 MG tablet Take 600 mg by mouth every 6 (six) hours as  needed.     No current facility-administered medications for this visit.    PHYSICAL EXAMINATION: ECOG PERFORMANCE STATUS: 1 - Symptomatic but completely ambulatory  Vitals:   07/21/19 0937  BP: 135/65  Pulse: (!) 105  Resp: 18  Temp: 98 F (36.7 C)  SpO2: 98%   Filed Weights   07/21/19 0937  Weight: 219 lb 12.8 oz (99.7 kg)    GENERAL:alert, no distress and comfortable Musculoskeletal:no cyanosis of digits and no clubbing  NEURO: alert & oriented x 3 with fluent speech, no focal motor/sensory deficits  LABORATORY DATA:  I have reviewed the data as listed    Component Value Date/Time   NA 140 07/20/2019 1005   K 3.8 07/20/2019 1005   CL 105 07/20/2019 1005   CO2 27 07/20/2019 1005   GLUCOSE 102 (H) 07/20/2019 1005   BUN 17 07/20/2019 1005   CREATININE 0.69 07/20/2019 1005   CALCIUM 9.1 07/20/2019 1005   PROT 7.1 07/20/2019 1005   ALBUMIN 3.5 07/20/2019 1005   AST 33 07/20/2019 1005   ALT 32 07/20/2019 1005   ALKPHOS 75 07/20/2019 1005   BILITOT 0.4 07/20/2019 1005   GFRNONAA >60 07/20/2019 1005   GFRAA >60 07/20/2019 1005    No results found for: SPEP, UPEP  Lab Results  Component Value Date   WBC 4.8 07/20/2019   NEUTROABS 3.3 07/20/2019   HGB 10.6 (L) 07/20/2019   HCT 32.1 (L) 07/20/2019  MCV 91.2 07/20/2019   PLT 174 07/20/2019      Chemistry      Component Value Date/Time   NA 140 07/20/2019 1005   K 3.8 07/20/2019 1005   CL 105 07/20/2019 1005   CO2 27 07/20/2019 1005   BUN 17 07/20/2019 1005   CREATININE 0.69 07/20/2019 1005      Component Value Date/Time   CALCIUM 9.1 07/20/2019 1005   ALKPHOS 75 07/20/2019 1005   AST 33 07/20/2019 1005   ALT 32 07/20/2019 1005   BILITOT 0.4 07/20/2019 1005       RADIOGRAPHIC STUDIES: I have reviewed multiple imaging studies with the patient and her husband I have personally reviewed the radiological images as listed and agreed with the findings in the report. CT ABDOMEN PELVIS W  CONTRAST  Result Date: 07/20/2019 CLINICAL DATA:  Right fallopian tube adenocarcinoma, assess treatment response EXAM: CT ABDOMEN AND PELVIS WITH CONTRAST TECHNIQUE: Multidetector CT imaging of the abdomen and pelvis was performed using the standard protocol following bolus administration of intravenous contrast. CONTRAST:  167m OMNIPAQUE IOHEXOL 300 MG/ML SOLN, additional oral enteric contrast COMPARISON:  Outside MR pelvis, 01/03/2019, CT chest, 05/08/2015 FINDINGS: Lower chest: No acute abnormality. Stable, benign small pulmonary nodule of the left lung base as seen on prior CT dated 2016. Hepatobiliary: No solid liver abnormality is seen. Hepatic steatosis. No gallstones, gallbladder wall thickening, or biliary dilatation. Pancreas: Unremarkable. No pancreatic ductal dilatation or surrounding inflammatory changes. Spleen: Normal in size without significant abnormality. Adrenals/Urinary Tract: Adrenal glands are unremarkable. Kidneys are normal, without renal calculi, solid lesion, or hydronephrosis. Bladder is unremarkable. Stomach/Bowel: Status post partial sleeve gastrectomy. Appendix appears normal. No evidence of bowel wall thickening, distention, or inflammatory changes. Status post omentectomy. Vascular/Lymphatic: No significant vascular findings are present. No pathologically enlarged abdominal or pelvic lymph nodes. Small prominent pelvic and inguinal lymph nodes are unchanged compared to prior MRI. Reproductive: Status post interval hysterectomy and bilateral oophorectomy. Other: No abdominal wall hernia or abnormality. No abdominopelvic ascites. Musculoskeletal: No acute or significant osseous findings. IMPRESSION: 1. Status post interval hysterectomy and bilateral oophorectomy as well as omentectomy. 2. No CT evidence of nodal or distant metastatic disease within the abdomen or pelvis. 3. Hepatic steatosis. Electronically Signed   By: AEddie CandleM.D.   On: 07/20/2019 12:40

## 2019-07-21 NOTE — Assessment & Plan Note (Addendum)
I have reviewed all blood work and CT imaging with the patient She have no evidence of disease I will get her port removed She is comfortable returning to [facility] to follow with Dr. Skeet Latch The patient is educated to watch out for signs and symptoms of cancer recurrence There is no contraindication for her to proceed with Covid vaccine She is still recovering from side effects of therapy I recommend she takes a few more weeks of to heal from recent treatment but she should be able to return back to work on April 1

## 2019-07-21 NOTE — Assessment & Plan Note (Signed)
She has mild neuropathy but it is overall improving I recommend she continues taking gabapentin In a few months time, I anticipate her neuropathy will improve and by then I recommend slow taper of gabapentin

## 2019-07-25 ENCOUNTER — Other Ambulatory Visit: Payer: Self-pay | Admitting: Radiology

## 2019-07-27 ENCOUNTER — Encounter (HOSPITAL_COMMUNITY): Payer: Self-pay

## 2019-07-27 ENCOUNTER — Ambulatory Visit (HOSPITAL_COMMUNITY)
Admission: RE | Admit: 2019-07-27 | Discharge: 2019-07-27 | Disposition: A | Discharge status: Home / Self Care | Payer: 59 | Source: Ambulatory Visit | Attending: Hematology and Oncology | Admitting: Hematology and Oncology

## 2019-07-27 ENCOUNTER — Other Ambulatory Visit: Payer: Self-pay

## 2019-07-27 DIAGNOSIS — C482 Malignant neoplasm of peritoneum, unspecified: Secondary | ICD-10-CM

## 2019-07-27 DIAGNOSIS — Z8049 Family history of malignant neoplasm of other genital organs: Secondary | ICD-10-CM | POA: Insufficient documentation

## 2019-07-27 DIAGNOSIS — Z452 Encounter for adjustment and management of vascular access device: Secondary | ICD-10-CM | POA: Insufficient documentation

## 2019-07-27 DIAGNOSIS — Z9884 Bariatric surgery status: Secondary | ICD-10-CM | POA: Diagnosis not present

## 2019-07-27 DIAGNOSIS — Z809 Family history of malignant neoplasm, unspecified: Secondary | ICD-10-CM | POA: Diagnosis not present

## 2019-07-27 DIAGNOSIS — Z79899 Other long term (current) drug therapy: Secondary | ICD-10-CM | POA: Insufficient documentation

## 2019-07-27 DIAGNOSIS — Z8041 Family history of malignant neoplasm of ovary: Secondary | ICD-10-CM | POA: Insufficient documentation

## 2019-07-27 DIAGNOSIS — Z808 Family history of malignant neoplasm of other organs or systems: Secondary | ICD-10-CM | POA: Diagnosis not present

## 2019-07-27 DIAGNOSIS — Z90710 Acquired absence of both cervix and uterus: Secondary | ICD-10-CM | POA: Diagnosis not present

## 2019-07-27 DIAGNOSIS — Z85028 Personal history of other malignant neoplasm of stomach: Secondary | ICD-10-CM | POA: Insufficient documentation

## 2019-07-27 HISTORY — PX: IR REMOVAL TUN ACCESS W/ PORT W/O FL MOD SED: IMG2290

## 2019-07-27 LAB — CBC WITH DIFFERENTIAL/PLATELET
Abs Immature Granulocytes: 0.01 10*3/uL (ref 0.00–0.07)
Basophils Absolute: 0 10*3/uL (ref 0.0–0.1)
Basophils Relative: 0 %
Eosinophils Absolute: 0.2 10*3/uL (ref 0.0–0.5)
Eosinophils Relative: 5 %
HCT: 36.8 % (ref 36.0–46.0)
Hemoglobin: 11.5 g/dL — ABNORMAL LOW (ref 12.0–15.0)
Immature Granulocytes: 0 %
Lymphocytes Relative: 23 %
Lymphs Abs: 1 10*3/uL (ref 0.7–4.0)
MCH: 30 pg (ref 26.0–34.0)
MCHC: 31.3 g/dL (ref 30.0–36.0)
MCV: 96.1 fL (ref 80.0–100.0)
Monocytes Absolute: 0.4 10*3/uL (ref 0.1–1.0)
Monocytes Relative: 9 %
Neutro Abs: 2.8 10*3/uL (ref 1.7–7.7)
Neutrophils Relative %: 63 %
Platelets: 261 10*3/uL (ref 150–400)
RBC: 3.83 MIL/uL — ABNORMAL LOW (ref 3.87–5.11)
RDW: 14.5 % (ref 11.5–15.5)
WBC: 4.4 10*3/uL (ref 4.0–10.5)
nRBC: 0 % (ref 0.0–0.2)

## 2019-07-27 LAB — PROTIME-INR
INR: 0.9 (ref 0.8–1.2)
Prothrombin Time: 12 seconds (ref 11.4–15.2)

## 2019-07-27 MED ORDER — SODIUM CHLORIDE 0.9 % IV SOLN: INTRAVENOUS | Status: DC

## 2019-07-27 MED ORDER — CEFAZOLIN SODIUM-DEXTROSE 2-4 GM/100ML-% IV SOLN
INTRAVENOUS | Status: AC
  Filled 2019-07-27: qty 100

## 2019-07-27 MED ORDER — FENTANYL CITRATE (PF) 100 MCG/2ML IJ SOLN
INTRAMUSCULAR | Status: AC
  Filled 2019-07-27: qty 2

## 2019-07-27 MED ORDER — LIDOCAINE HCL (PF) 1 % IJ SOLN
INTRAMUSCULAR | Status: AC | PRN
  Administered 2019-07-27: 10 mL

## 2019-07-27 MED ORDER — CEFAZOLIN SODIUM-DEXTROSE 2-4 GM/100ML-% IV SOLN
2.0000 g | INTRAVENOUS | Status: AC
  Administered 2019-07-27: 2 g via INTRAVENOUS

## 2019-07-27 MED ORDER — FENTANYL CITRATE (PF) 100 MCG/2ML IJ SOLN
INTRAMUSCULAR | Status: AC | PRN
  Administered 2019-07-27 (×2): 50 ug via INTRAVENOUS

## 2019-07-27 MED ORDER — LIDOCAINE HCL 1 % IJ SOLN
INTRAMUSCULAR | Status: AC
  Filled 2019-07-27: qty 20

## 2019-07-27 MED ORDER — MIDAZOLAM HCL 2 MG/2ML IJ SOLN
INTRAMUSCULAR | Status: AC | PRN
  Administered 2019-07-27 (×2): 1 mg via INTRAVENOUS

## 2019-07-27 MED ORDER — MIDAZOLAM HCL 2 MG/2ML IJ SOLN
INTRAMUSCULAR | Status: AC
  Filled 2019-07-27: qty 4

## 2019-07-27 NOTE — Procedures (Signed)
Interventional Radiology Procedure Note  Procedure: Port removal  Complications: None  Estimated Blood Loss: < 10 mL  Findings: Right chest port removed in entirety. Wound closed.  Melisse Caetano T. Zyren Sevigny, M.D Pager:  [phone]    

## 2019-07-27 NOTE — H&P (Signed)
Chief Complaint: Patient was seen in consultation today for peritoneal adenocarcinoma in remission/Port-a-cath removal.  Referring Physician(s): Heath Lark  Supervising Physician: Aletta Edouard  Patient Status: [facility] - Out-pt  History of Present Illness: Caitlin [name] is a 44 y.o. female with a past medical history of hypercholesterolemia and peritoneal adenocarcinoma. She was unfortunately diagnosed with peritoneal adenocarcinoma in 01/2019. Her cancer is managed by Dr. Alvy Bimler.  She underwent a robotic-assisted total laparoscopic hysterectomy with bilateral salpingo-oophorectomy, bilateral pelvic lymphadenectomy, right common iliac lymphadenectomy, peritoneal biopsies, and minilaparotomy for infragastric ometectomy in OR 02/08/2019 by Dr. Skeet Latch. In addition, she had a Port-a-cath placed in IR 03/03/2019 by Dr. Laurence Ferrari. She also has completed a course of systemic chemotherapy and is currently in remission.  IR requested by Dr. Alvy Bimler for possible Port-a-cath removal. Patient awake and alert laying in bed with no complaints at this time. Denies fever, chills, chest pain, dyspnea, abdominal pain, or headache.   Past Medical History:  Diagnosis Date  . Bacterial vaginitis   . Cancer [facility])    Primary peritoneal carcinoma  . Depression   . Family history of cervical cancer   . Family history of ovarian cancer   . Family history of throat cancer   . Hypercholesterolemia 05/17/2017    Past Surgical History:  Procedure Laterality Date  . ABDOMINAL HYSTERECTOMY    . DIAGNOSTIC LAPAROSCOPY    . DILATION AND CURETTAGE OF UTERUS    . GASTRIC BYPASS  2017  . HYSTEROSCOPY WITH D & C Bilateral 06/07/2013   Procedure: DILATATION AND CURETTAGE /HYSTEROSCOPY with bilateral tubal cathetrization  ;  Surgeon: Governor Specking, MD;  Location: [facility];  Service: Gynecology;  Laterality: Bilateral;  . IR IMAGING GUIDED PORT INSERTION  03/03/2019  . LAPAROSCOPY Left 06/07/2013   Procedure:  LAPAROSCOPY DIAGNOSTIC with aspiration of left peritubal cyst  ;  Surgeon: Governor Specking, MD;  Location: [facility];  Service: Gynecology;  Laterality: Left;  . LAPAROTOMY Right 06/07/2013   Procedure: LAPAROTOMY with bilateral corneal segmental salpingectomy right corneal anastamosis;  Surgeon: Governor Specking, MD;  Location: [facility];  Service: Gynecology;  Laterality: Right;    Allergies: Patient has no known allergies.  Medications: Prior to Admission medications   Medication Sig Start Date End Date Taking? Authorizing Provider  acetaminophen (TYLENOL) 325 MG tablet Take 650 mg by mouth every 6 (six) hours as needed.    [provider]  diclofenac Sodium (VOLTAREN) 1 % GEL Apply 4 g topically 4 (four) times daily as needed. 04/03/19   Marcial Pacas, MD  gabapentin (NEURONTIN) 100 MG capsule Take 1 capsule (100 mg total) by mouth 3 (three) times daily. 03/29/19   Marcial Pacas, MD  ibuprofen (ADVIL) 600 MG tablet Take 600 mg by mouth every 6 (six) hours as needed.    [provider]     Family History  Problem Relation Age of Onset  . Ovarian cancer Mother 86  . Diabetes Paternal Grandmother   . Cancer Father        unknown type - possibly skin  . HIV Father   . Heart Problems Maternal Grandfather   . Cancer Paternal Grandfather        unknown type, diagnosed older than 74  . Cancer Maternal Uncle        unknown type, diagnosed older than 67  . Throat cancer Maternal Uncle        diagnosed older than 82s, smoker  . Cervical cancer Cousin  paternal cousin  . Breast cancer Neg Hx   . Colon cancer Neg Hx     Social History   Socioeconomic History  . Marital status: Married    Spouse name: Izora Gala  . Number of children: 1  . Years of education: college  . Highest education level: Not on file  Occupational History  . Occupation: receptionist  Tobacco Use  . Smoking status: Never Smoker  . Smokeless tobacco: Never Used  Substance and Sexual Activity  .  Alcohol use: No  . Drug use: No  . Sexual activity: Yes    Birth control/protection: None  Other Topics Concern  . Not on file  Social History Narrative   Lives at home with her husband.   Right-handed.   No daily caffeine use.   Social Determinants of Health   Financial Resource Strain:   . Difficulty of Paying Living Expenses:   Food Insecurity:   . Worried About Charity fundraiser in the Last Year:   . Arboriculturist in the Last Year:   Transportation Needs:   . Film/video editor (Medical):   Marland Kitchen Lack of Transportation (Non-Medical):   Physical Activity:   . Days of Exercise per Week:   . Minutes of Exercise per Session:   Stress:   . Feeling of Stress :   Social Connections:   . Frequency of Communication with Friends and Family:   . Frequency of Social Gatherings with Friends and Family:   . Attends Religious Services:   . Active Member of Clubs or Organizations:   . Attends Archivist Meetings:   Marland Kitchen Marital Status:      Review of Systems: A 12 point ROS discussed and pertinent positives are indicated in the HPI above.  All other systems are negative.  Review of Systems  Constitutional: Negative for chills and fever.  Respiratory: Negative for shortness of breath and wheezing.   Cardiovascular: Negative for chest pain and palpitations.  Gastrointestinal: Negative for abdominal pain.  Neurological: Negative for headaches.  Psychiatric/Behavioral: Negative for behavioral problems and confusion.    Vital Signs: LMP 01/23/2019 (Within Weeks)   Physical Exam Vitals and nursing note reviewed.  Constitutional:      General: She is not in acute distress.    Appearance: Normal appearance.  Cardiovascular:     Rate and Rhythm: Normal rate and regular rhythm.     Heart sounds: Normal heart sounds. No murmur.  Pulmonary:     Effort: Pulmonary effort is normal. No respiratory distress.     Breath sounds: Normal breath sounds. No wheezing.  Skin:     General: Skin is warm and dry.  Neurological:     Mental Status: She is alert and oriented to person, place, and time.  Psychiatric:        Mood and Affect: Mood normal.        Behavior: Behavior normal.      MD Evaluation Airway: WNL Heart: WNL Abdomen: WNL Chest/ Lungs: WNL ASA  Classification: 3 Mallampati/Airway Score: Two   Imaging: CT ABDOMEN PELVIS W CONTRAST  Result Date: 07/20/2019 CLINICAL DATA:  Right fallopian tube adenocarcinoma, assess treatment response EXAM: CT ABDOMEN AND PELVIS WITH CONTRAST TECHNIQUE: Multidetector CT imaging of the abdomen and pelvis was performed using the standard protocol following bolus administration of intravenous contrast. CONTRAST:  140mL OMNIPAQUE IOHEXOL 300 MG/ML SOLN, additional oral enteric contrast COMPARISON:  Outside MR pelvis, 01/03/2019, CT chest, 05/08/2015 FINDINGS: Lower chest: No acute  abnormality. Stable, benign small pulmonary nodule of the left lung base as seen on prior CT dated 2016. Hepatobiliary: No solid liver abnormality is seen. Hepatic steatosis. No gallstones, gallbladder wall thickening, or biliary dilatation. Pancreas: Unremarkable. No pancreatic ductal dilatation or surrounding inflammatory changes. Spleen: Normal in size without significant abnormality. Adrenals/Urinary Tract: Adrenal glands are unremarkable. Kidneys are normal, without renal calculi, solid lesion, or hydronephrosis. Bladder is unremarkable. Stomach/Bowel: Status post partial sleeve gastrectomy. Appendix appears normal. No evidence of bowel wall thickening, distention, or inflammatory changes. Status post omentectomy. Vascular/Lymphatic: No significant vascular findings are present. No pathologically enlarged abdominal or pelvic lymph nodes. Small prominent pelvic and inguinal lymph nodes are unchanged compared to prior MRI. Reproductive: Status post interval hysterectomy and bilateral oophorectomy. Other: No abdominal wall hernia or abnormality. No  abdominopelvic ascites. Musculoskeletal: No acute or significant osseous findings. IMPRESSION: 1. Status post interval hysterectomy and bilateral oophorectomy as well as omentectomy. 2. No CT evidence of nodal or distant metastatic disease within the abdomen or pelvis. 3. Hepatic steatosis. Electronically Signed   By: Eddie Candle M.D.   On: 07/20/2019 12:40    Labs:  CBC: Recent Labs    05/05/19 1005 05/29/19 0825 06/23/19 1033 07/20/19 1005  WBC 5.3 7.9 6.1 4.8  HGB 10.6* 10.9* 10.6* 10.6*  HCT 32.6* 33.0* 32.0* 32.1*  PLT 179 178 214 174    COAGS: Recent Labs    03/03/19 1235  INR 0.9    BMP: Recent Labs    05/05/19 1005 05/29/19 0825 06/23/19 1033 07/20/19 1005  NA 141 138 142 140  K 3.8 3.8 3.6 3.8  CL 104 104 107 105  CO2 28 23 25 27   GLUCOSE 115* 231* 133* 102*  BUN 17 19 17 17   CALCIUM 9.1 9.1 9.5 9.1  CREATININE 0.76 0.80 0.75 0.69  GFRNONAA >60 >60 >60 >60  GFRAA >60 >60 >60 >60    LIVER FUNCTION TESTS: Recent Labs    05/05/19 1005 05/29/19 0825 06/23/19 1033 07/20/19 1005  BILITOT 0.3 0.3 0.3 0.4  AST 27 24 25  33  ALT 30 36 33 32  ALKPHOS 83 86 78 75  PROT 7.3 7.5 7.5 7.1  ALBUMIN 3.5 3.6 3.6 3.5     Assessment and Plan:  Peritoneal adenocarcinoma currently in remission. Plan for Port-a-cath removal today in IR. Patient is NPO. Afebrile. She does not take blood thinners.  Risks and benefits of Port-a-catheter removal were discussed with the patient including, but not limited to bleeding or infection. All of the patient's questions were answered, patient is agreeable to proceed. Consent signed and in chart.   Thank you for this interesting consult.  I greatly enjoyed meeting Caitlin [name] and look forward to participating in their care.  A copy of this report was sent to the requesting provider on this date.  Electronically Signed: Earley Abide, PA-C 07/27/2019, 10:30 AM   I spent a total of 25 Minutes in face to face in  clinical consultation, greater than 50% of which was counseling/coordinating care for peritoneal adenocarcinoma in remission/Port-a-cath removal.

## 2019-07-27 NOTE — Discharge Instructions (Signed)
Implanted Port Removal, Care After This sheet gives you information about how to care for yourself after your procedure. Your health care provider may also give you more specific instructions. If you have problems or questions, contact your health care provider. What can I expect after the procedure? After the procedure, it is common to have:  Soreness or pain near your incision.  Some swelling or bruising near your incision. Follow these instructions at home: Medicines  Take over-the-counter and prescription medicines only as told by your health care provider.  If you were prescribed an antibiotic medicine, take it as told by your health care provider. Do not stop taking the antibiotic even if you start to feel better. Bathing Do not take baths, swim, or use a hot tub until your health care provider approves. You may remove dressing and shower tomorrow. Incision care   Follow instructions from your health care provider about how to take care of your incision. Make sure you: ? Wash your hands with soap and water before you change your bandage (dressing). If soap and water are not available, use hand sanitizer. ? Change your dressing as told by your health care provider. You may remove dressing tomorrow.   Leave  skin glue in place. These skin closures may need to stay in place for 2 weeks or longer. Check your incision area every day for signs of infection. Check for: ? More redness, swelling, or pain. ? More fluid or blood. ? Warmth. ? Pus or a bad smell. Driving   Do not drive for 24 hours if you were given a medicine to help you relax (sedative) during your procedure.  If you did not receive a sedative, ask your health care provider when it is safe to drive. Activity  Return to your normal activities as told by your health care provider. Ask your health care provider what activities are safe for you.  Do not lift anything that is heavier than 10 lb (4.5 kg), or the limit  that you are told, until your health care provider says that it is safe.  Do not do activities that involve lifting your arms over your head. General instructions  Do not use any products that contain nicotine or tobacco, such as cigarettes and e-cigarettes. These can delay healing. If you need help quitting, ask your health care provider.  Keep all follow-up visits as told by your health care provider. This is important. Contact a health care provider if:  You have more redness, swelling, or pain around your incision.  You have more fluid or blood coming from your incision.  Your incision feels warm to the touch.  You have pus or a bad smell coming from your incision.  You have pain that is not relieved by your pain medicine. Get help right away if you have:  A fever or chills.  Chest pain.  Difficulty breathing. Summary  After the procedure, it is common to have pain, soreness, swelling, or bruising near your incision.  If you were prescribed an antibiotic medicine, take it as told by your health care provider. Do not stop taking the antibiotic even if you start to feel better.  Do not drive for 24 hours if you were given a sedative during your procedure.  Return to your normal activities as told by your health care provider. Ask your health care provider what activities are safe for you. This information is not intended to replace advice given to you by your health care provider.  Make sure you discuss any questions you have with your health care provider. Document Revised: 06/17/2017 Document Reviewed: 06/17/2017 Elsevier Patient Education  [place].      Moderate Conscious Sedation, Adult, Care After These instructions provide you with information about caring for yourself after your procedure. Your health care provider may also give you more specific instructions. Your treatment has been planned according to current medical practices, but problems sometimes  occur. Call your health care provider if you have any problems or questions after your procedure. What can I expect after the procedure? After your procedure, it is common:  To feel sleepy for several hours.  To feel clumsy and have poor balance for several hours.  To have poor judgment for several hours.  To vomit if you eat too soon. Follow these instructions at home: For at least 24 hours after the procedure:   Do not: ? Participate in activities where you could fall or become injured. ? Drive. ? Use heavy machinery. ? Drink alcohol. ? Take sleeping pills or medicines that cause drowsiness. ? Make important decisions or sign legal documents. ? Take care of children on your own.  Rest. Eating and drinking  Follow the diet recommended by your health care provider.  If you vomit: ? Drink water, juice, or soup when you can drink without vomiting. ? Make sure you have little or no nausea before eating solid foods. General instructions  Have a responsible adult stay with you until you are awake and alert.  Take over-the-counter and prescription medicines only as told by your health care provider.  If you smoke, do not smoke without supervision.  Keep all follow-up visits as told by your health care provider. This is important. Contact a health care provider if:  You keep feeling nauseous or you keep vomiting.  You feel light-headed.  You develop a rash.  You have a fever. Get help right away if:  You have trouble breathing. This information is not intended to replace advice given to you by your health care provider. Make sure you discuss any questions you have with your health care provider. Document Revised: 04/16/2017 Document Reviewed: 08/24/2015 Elsevier Patient Education  2020 [employer].

## 2019-10-03 ENCOUNTER — Telehealth: Payer: Self-pay | Admitting: Oncology

## 2019-10-03 NOTE — Telephone Encounter (Signed)
I sent scheduling msg 

## 2019-10-03 NOTE — Telephone Encounter (Signed)
Taiyana called and said she just saw Dr. Skeet Latch and needs to set up a follow up appointment with Dr. Alvy Bimler in August. Advised her that a scheduler will call her with an appointment.

## 2019-10-04 ENCOUNTER — Telehealth: Payer: Self-pay | Admitting: Hematology and Oncology

## 2019-10-04 NOTE — Telephone Encounter (Signed)
Scheduled per 5/18 sch message. Pt aware of appts on 8/23.

## 2019-11-09 ENCOUNTER — Telehealth: Payer: Self-pay | Admitting: *Deleted

## 2019-11-09 ENCOUNTER — Ambulatory Visit: Payer: Self-pay | Admitting: Neurology

## 2019-11-09 ENCOUNTER — Encounter: Payer: Self-pay | Admitting: Neurology

## 2019-11-09 NOTE — Telephone Encounter (Signed)
No showed follow up appointment. 

## 2019-12-28 ENCOUNTER — Ambulatory Visit: Payer: 59 | Admitting: Neurology

## 2019-12-28 ENCOUNTER — Telehealth: Payer: Self-pay | Admitting: Neurology

## 2019-12-28 VITALS — BP 139/86 | HR 77 | Ht 62.0 in | Wt 226.5 lb

## 2019-12-28 DIAGNOSIS — G620 Drug-induced polyneuropathy: Secondary | ICD-10-CM | POA: Diagnosis not present

## 2019-12-28 DIAGNOSIS — M542 Cervicalgia: Secondary | ICD-10-CM | POA: Diagnosis not present

## 2019-12-28 DIAGNOSIS — T451X5A Adverse effect of antineoplastic and immunosuppressive drugs, initial encounter: Secondary | ICD-10-CM

## 2019-12-28 MED ORDER — DULOXETINE HCL 30 MG PO CPEP: 30.0000 mg | ORAL_CAPSULE | Freq: Every day | ORAL | 5 refills | Status: DC

## 2019-12-28 NOTE — Progress Notes (Signed)
PATIENT: Caitlin [name] DOB: March 01, 1976  Chief Complaint  Patient presents with  . Follow-up    RM EMG 4, alone. Here to f/u on neck/shoulder pain, bilaterally that radiates down into her hands. Sx worsened after her last chemo treatment 06/23/19. She is also having low back pain. Her symptoms are constant.     HISTORICAL  Caitlin [name] is a 44 year old female, accompanied by her husband [name], seen in request by her primary care physician from [facility], and orthopedic surgeon Daryll Brod for evaluation of bilateral hands and feet paresthesia, initial evaluation was on March 29, 2019.  I have reviewed and summarized the referring note from the referring physician, also her oncology evaluation from Dr. Alvy Bimler, she had a past medical history of obesity, bariatric surgery in 2017, with 60 pounds of weight loss, she was diagnosed with right fallopian tube adenocarcinoma, and primary peritoneal carcinoma, had robotic assistant total laparoscopic hysterectomy, bilateral salpingo-oophorectomy, bilateral pelvic lymphadenectomy, right common iliac lymphadenectomy, peritoneal biopsy, lobectomy in September 2020, this is followed by chemotherapy since October 19, again on November 9 with Paclitaxel, Carboplatin   She used to work as a Research scientist (physical sciences), around May 2020, she noticed intermittent bilateral hands paresthesia, sometimes wake her up from sleep, her bilateral fingertips paresthesia become more obvious 2 days following her first chemotherapy on March 06, 2019, she describes 6 out of 10 constant bilateral fingertip numb tingly achy sensation, radiating to bilateral forearm, she also complains of neck pain, radiating pain to bilateral shoulder, also described bilateral feet, below the knee paresthesia, achy pain  She has taking gabapentin 300 mg every night, which does help her sleep better, but complains of excessive fatigue and drowsiness during the day  She denies significant  weakness, mild gait abnormality due to heel pain chest tube insertion site of back surgery more she denies bowel and bladder incontinence  UPDATE December 28 2019: Reviewed note note from her oncologist Dr. Alvy Bimler, she completed her chemotherapy in February 2021, able to go back to work since April 2021, she complains worsening bilateral hand swelling, achy pain, numbness sensation, constant, intermittent bilateral feet paresthesia, also complains of neck pain, radiating pain to bilateral shoulder, left worse than right,  She has intermittent gait instability contributed to her pain, no bowel and bladder incontinence,  Reviewed laboratory evaluation from 2020, normal or negative CPK, TSH, B12, RPR, iron, copper level, vitamin D, B1, A1c was slight elevated 5.7, vitamin D level was 12.7,  CBC March 2021, anemia hemoglobin of 11.5, CMP was normal with exception of mildly elevated glucose 102   REVIEW OF SYSTEMS: Full 14 system review of systems performed and notable only for as above All other review of systems were negative.  ALLERGIES: No Known Allergies  HOME MEDICATIONS: Current Outpatient Medications  Medication Sig Dispense Refill  . gabapentin (NEURONTIN) 100 MG capsule Take 1 capsule (100 mg total) by mouth 3 (three) times daily. (Patient taking differently: Take 100 mg by mouth as needed. ) 90 capsule 11   No current facility-administered medications for this visit.    PAST MEDICAL HISTORY: Past Medical History:  Diagnosis Date  . Bacterial vaginitis   . Cancer [facility])    Primary peritoneal carcinoma  . Depression   . Family history of cervical cancer   . Family history of ovarian cancer   . Family history of throat cancer   . Hypercholesterolemia 05/17/2017    PAST SURGICAL HISTORY: Past Surgical History:  Procedure Laterality Date  . ABDOMINAL  HYSTERECTOMY    . DIAGNOSTIC LAPAROSCOPY    . DILATION AND CURETTAGE OF UTERUS    . GASTRIC BYPASS  2017  . HYSTEROSCOPY  WITH D & C Bilateral 06/07/2013   Procedure: DILATATION AND CURETTAGE /HYSTEROSCOPY with bilateral tubal cathetrization  ;  Surgeon: Governor Specking, MD;  Location: [facility];  Service: Gynecology;  Laterality: Bilateral;  . IR IMAGING GUIDED PORT INSERTION  03/03/2019  . IR REMOVAL TUN ACCESS W/ PORT W/O FL MOD SED  07/27/2019  . LAPAROSCOPY Left 06/07/2013   Procedure: LAPAROSCOPY DIAGNOSTIC with aspiration of left peritubal cyst  ;  Surgeon: Governor Specking, MD;  Location: [facility];  Service: Gynecology;  Laterality: Left;  . LAPAROTOMY Right 06/07/2013   Procedure: LAPAROTOMY with bilateral corneal segmental salpingectomy right corneal anastamosis;  Surgeon: Governor Specking, MD;  Location: [facility];  Service: Gynecology;  Laterality: Right;    FAMILY HISTORY: Family History  Problem Relation Age of Onset  . Ovarian cancer Mother 22  . Diabetes Paternal Grandmother   . Cancer Father        unknown type - possibly skin  . HIV Father   . Heart Problems Maternal Grandfather   . Cancer Paternal Grandfather        unknown type, diagnosed older than 28  . Cancer Maternal Uncle        unknown type, diagnosed older than 34  . Throat cancer Maternal Uncle        diagnosed older than 85s, smoker  . Cervical cancer Cousin        paternal cousin  . Breast cancer Neg Hx   . Colon cancer Neg Hx     SOCIAL HISTORY: Social History   Socioeconomic History  . Marital status: Married    Spouse name: Caitlin [name]  . Number of children: 1  . Years of education: college  . Highest education level: Not on file  Occupational History  . Occupation: receptionist  Tobacco Use  . Smoking status: Never Smoker  . Smokeless tobacco: Never Used  Vaping Use  . Vaping Use: Never used  Substance and Sexual Activity  . Alcohol use: No  . Drug use: No  . Sexual activity: Yes    Birth control/protection: None  Other Topics Concern  . Not on file  Social History Narrative   Lives at home with her husband.    Right-handed.   No daily caffeine use.   Social Determinants of Health   Financial Resource Strain:   . Difficulty of Paying Living Expenses:   Food Insecurity:   . Worried About Charity fundraiser in the Last Year:   . Arboriculturist in the Last Year:   Transportation Needs:   . Film/video editor (Medical):   Marland Kitchen Lack of Transportation (Non-Medical):   Physical Activity:   . Days of Exercise per Week:   . Minutes of Exercise per Session:   Stress:   . Feeling of Stress :   Social Connections:   . Frequency of Communication with Friends and Family:   . Frequency of Social Gatherings with Friends and Family:   . Attends Religious Services:   . Active Member of Clubs or Organizations:   . Attends Archivist Meetings:   Marland Kitchen Marital Status:   Intimate Partner Violence:   . Fear of Current or Ex-Partner:   . Emotionally Abused:   Marland Kitchen Physically Abused:   . Sexually Abused:      PHYSICAL EXAM  Vitals:   12/28/19 0714  BP: 139/86  Pulse: 77  Weight: 226 lb 8 oz (102.7 kg)  Height: 5\' 2"  (1.575 m)   Not recorded     Body mass index is 41.43 kg/m.  PHYSICAL EXAMNIATION:  Gen: NAD, conversant, well nourised, well groomed                     Cardiovascular: Regular rate rhythm, no peripheral edema, warm, nontender. Eyes: Conjunctivae clear without exudates or hemorrhage Neck: Supple, no carotid bruits. Pulmonary: Clear to auscultation bilaterally   NEUROLOGICAL EXAM:  MENTAL STATUS: Tired looking middle-aged female Speech:    Speech is normal; fluent and spontaneous with normal comprehension.  Cognition:     Orientation to time, place and person     Normal recent and remote memory     Normal Attention span and concentration     Normal Language, naming, repeating,spontaneous speech     Fund of knowledge   CRANIAL NERVES: CN II: Visual fields are full to confrontation.  Pupils are round equal and briskly reactive to light. CN III, IV, VI:  extraocular movement are normal. No ptosis. CN V: Facial sensation is intact to pinprick in all 3 divisions bilaterally. Corneal responses are intact.  CN VII: Face is symmetric with normal eye closure and smile. CN VIII: Hearing is normal to causal conversation. CN IX, X: Palate elevates symmetrically. Phonation is normal. CN XI: Head turning and shoulder shrug are intact CN XII: Tongue is midline with normal movements and no atrophy.  MOTOR: There is no pronator drift of out-stretched arms. Muscle bulk and tone are normal. Muscle strength is normal.  REFLEXES: Reflexes are 1 and symmetric at the biceps, triceps, knees, and ankles. Plantar responses are flexor.  SENSORY: Preserved vibratory sensation, mildly decreased light touch pinprick to ankle level  COORDINATION: Rapid alternating movements and fine finger movements are intact. There is no dysmetria on finger-to-nose and heel-knee-shin.    GAIT/STANCE: Posture is normal. Gait is steady with normal steps, base, arm swing, and turning. DIAGNOSTIC DATA (LABS, IMAGING, TESTING) - I reviewed patient records, labs, notes, testing and imaging myself where available.   ASSESSMENT AND PLAN  Jamisyn Langer is a 44 y.o. 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, Treatment for ovarian, fallopian, and primary peritoneal carcinoma stage II, complete chemotherapy February 2021  Most likely related to the side effect of chemotherapy treatment  History of bariatric surgery, laboratory evaluation showed vitamin D deficiency  Complaining of side effects with gabapentin, sleepiness, fatigue, currently take it during the nighttime,  Worsening upper extremity paresthesia, swelling, achy pain, add on Cymbalta 30 mg daily  EMG nerve conduction study  Neck pain, radiating pain to bilateral shoulder and upper extremity  MRI of cervical spine to rule out cervical radiculopathy   Marcial Pacas, M.D. Ph.D.  [facility] [address], [place], [place] [address] Ph: [phone] Fax: [phone]  CC: Daryll Brod, MD [place]

## 2019-12-28 NOTE — Telephone Encounter (Signed)
[employer] order sent to [facility]. They will obtain the auth and reach out to the patient to schedule.  

## 2019-12-30 ENCOUNTER — Ambulatory Visit
Admission: RE | Admit: 2019-12-30 | Discharge: 2019-12-30 | Disposition: A | Discharge status: Home / Self Care | Payer: 59 | Source: Ambulatory Visit | Attending: Neurology | Admitting: Neurology

## 2019-12-30 DIAGNOSIS — M542 Cervicalgia: Secondary | ICD-10-CM | POA: Diagnosis not present

## 2019-12-30 DIAGNOSIS — T451X5A Adverse effect of antineoplastic and immunosuppressive drugs, initial encounter: Secondary | ICD-10-CM

## 2019-12-30 DIAGNOSIS — G620 Drug-induced polyneuropathy: Secondary | ICD-10-CM

## 2020-01-01 NOTE — Telephone Encounter (Signed)
Caitlin [name]: [id number] (exp. 12/29/19 to 06/26/20) patient had MRI at [facility] on 12/30/19.

## 2020-01-08 ENCOUNTER — Other Ambulatory Visit: Payer: Self-pay | Admitting: Hematology and Oncology

## 2020-01-08 ENCOUNTER — Encounter: Payer: Self-pay | Admitting: Hematology and Oncology

## 2020-01-08 ENCOUNTER — Inpatient Hospital Stay: Payer: 59 | Admitting: Hematology and Oncology

## 2020-01-08 ENCOUNTER — Other Ambulatory Visit: Payer: Self-pay

## 2020-01-08 ENCOUNTER — Inpatient Hospital Stay: Payer: 59 | Attending: Hematology and Oncology

## 2020-01-08 DIAGNOSIS — G620 Drug-induced polyneuropathy: Secondary | ICD-10-CM | POA: Diagnosis not present

## 2020-01-08 DIAGNOSIS — T451X5A Adverse effect of antineoplastic and immunosuppressive drugs, initial encounter: Secondary | ICD-10-CM | POA: Diagnosis not present

## 2020-01-08 DIAGNOSIS — R109 Unspecified abdominal pain: Secondary | ICD-10-CM | POA: Insufficient documentation

## 2020-01-08 DIAGNOSIS — E669 Obesity, unspecified: Secondary | ICD-10-CM | POA: Diagnosis not present

## 2020-01-08 DIAGNOSIS — D638 Anemia in other chronic diseases classified elsewhere: Secondary | ICD-10-CM | POA: Insufficient documentation

## 2020-01-08 DIAGNOSIS — C482 Malignant neoplasm of peritoneum, unspecified: Secondary | ICD-10-CM

## 2020-01-08 DIAGNOSIS — D649 Anemia, unspecified: Secondary | ICD-10-CM | POA: Insufficient documentation

## 2020-01-08 DIAGNOSIS — Z6841 Body Mass Index (BMI) 40.0 and over, adult: Secondary | ICD-10-CM | POA: Diagnosis not present

## 2020-01-08 DIAGNOSIS — Z79899 Other long term (current) drug therapy: Secondary | ICD-10-CM | POA: Insufficient documentation

## 2020-01-08 DIAGNOSIS — G8929 Other chronic pain: Secondary | ICD-10-CM | POA: Diagnosis not present

## 2020-01-08 LAB — CBC WITH DIFFERENTIAL/PLATELET
Abs Immature Granulocytes: 0.03 10*3/uL (ref 0.00–0.07)
Basophils Absolute: 0 10*3/uL (ref 0.0–0.1)
Basophils Relative: 0 %
Eosinophils Absolute: 0.3 10*3/uL (ref 0.0–0.5)
Eosinophils Relative: 4 %
HCT: 37 % (ref 36.0–46.0)
Hemoglobin: 11.8 g/dL — ABNORMAL LOW (ref 12.0–15.0)
Immature Granulocytes: 0 %
Lymphocytes Relative: 19 %
Lymphs Abs: 1.3 10*3/uL (ref 0.7–4.0)
MCH: 28.2 pg (ref 26.0–34.0)
MCHC: 31.9 g/dL (ref 30.0–36.0)
MCV: 88.3 fL (ref 80.0–100.0)
Monocytes Absolute: 0.5 10*3/uL (ref 0.1–1.0)
Monocytes Relative: 7 %
Neutro Abs: 4.8 10*3/uL (ref 1.7–7.7)
Neutrophils Relative %: 70 %
Platelets: 200 10*3/uL (ref 150–400)
RBC: 4.19 MIL/uL (ref 3.87–5.11)
RDW: 12.4 % (ref 11.5–15.5)
WBC: 7 10*3/uL (ref 4.0–10.5)
nRBC: 0 % (ref 0.0–0.2)

## 2020-01-08 LAB — COMPREHENSIVE METABOLIC PANEL
ALT: 22 U/L (ref 0–44)
AST: 19 U/L (ref 15–41)
Albumin: 3.5 g/dL (ref 3.5–5.0)
Alkaline Phosphatase: 78 U/L (ref 38–126)
Anion gap: 8 (ref 5–15)
BUN: 15 mg/dL (ref 6–20)
CO2: 29 mmol/L (ref 22–32)
Calcium: 9.9 mg/dL (ref 8.9–10.3)
Chloride: 103 mmol/L (ref 98–111)
Creatinine, Ser: 0.87 mg/dL (ref 0.44–1.00)
GFR calc Af Amer: >60 mL/min (ref >60)
GFR calc non Af Amer: >60 mL/min (ref >60)
Glucose, Bld: 107 mg/dL — ABNORMAL HIGH (ref 70–99)
Potassium: 4 mmol/L (ref 3.5–5.1)
Sodium: 140 mmol/L (ref 135–145)
Total Bilirubin: 0.5 mg/dL (ref 0.3–1.2)
Total Protein: 7.1 g/dL (ref 6.5–8.1)

## 2020-01-08 MED ORDER — PROCHLORPERAZINE MALEATE 10 MG PO TABS
ORAL_TABLET | ORAL | Status: AC
  Filled 2020-01-08: qty 1

## 2020-01-08 NOTE — Assessment & Plan Note (Signed)
She has significant mild residual peripheral neuropathy from treatment and is seeing a neurologist She could not tolerate higher dose of Cymbalta or gabapentin Continue observation for now

## 2020-01-08 NOTE — Assessment & Plan Note (Signed)
This is likely anemia of chronic disease. The patient denies recent history of bleeding such as epistaxis, hematuria or hematochezia. She is asymptomatic from the anemia. We will observe for now.  It is improving I expect that to normalize in the future

## 2020-01-08 NOTE — Progress Notes (Signed)
[facility] OFFICE PROGRESS NOTE  Patient Care Team: Caitlin [name]. 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[facility]) She has nonspecific mild chronic abdominal pain which I do not believe is related to the disease Her recent CT imaging done at [facility] was negative We will call her with results of CA-125 I will alternate her appointment with her surgeon at [facility] I will see her again in 6 months for further follow-up  Peripheral neuropathy due to chemotherapy [facility]) She has significant mild residual peripheral neuropathy from treatment and is seeing a neurologist She could not tolerate higher dose of Cymbalta or gabapentin Continue observation for now  Anemia, chronic disease This is likely anemia of chronic disease. The patient denies recent history of bleeding such as epistaxis, hematuria or hematochezia. She is asymptomatic from the anemia. We will observe for now.  It is improving I expect that to normalize in the future   No orders of the defined types were placed in this encounter.   All questions were answered. The patient knows to call the clinic with any problems, questions or concerns. The total time spent in the appointment was 20 minutes encounter with patients including review of chart and various tests results, discussions about plan of care and coordination of care plan   Heath Lark, MD 01/08/2020 10:35 AM  INTERVAL HISTORY: Please see below for problem oriented charting. She returns for further follow-up She continues to have peripheral neuropathy at the hands and feet She is taking gabapentin and Cymbalta with neurology follow-up She has chronic intermittent abdominal pain radiating to her back She had MRI done recently by neurologist CT imaging at [facility] on August 5 was unremarkable for disease Her abdominal pain is not related with food She denies  recent constipation or bloating  SUMMARY OF ONCOLOGIC HISTORY: Oncology History Overview Note  Negative genetics Endometrioid   Primary peritoneal adenocarcinoma ([place])  11/24/2018 Imaging   CT chest with IV contrast elsewhere Stable small lung nodules, the largest is in the left lower lobe measuring 6 mm. These are unchanged from November 2017 and are benign in nature. No new nodules.    01/03/2019 Imaging   MRI pelvis Uterus: The uterus measures 5.8 x 9.3 x 5.8 cm. Unremarkable ENDOMETRIUM. Multiple uterine body intramural fibroids measuring up to 14 mm in size.  Mild endometrial fluid.   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 - Carcinoma appears to be arising in adnexal soft tissue, considered most consistent with stage pT2 (see comment) - Ovary with physiologic changes and and fallopian tube with paratubal cyst and surface adhesions; no definite parenchymal involvement by carcinoma identified - See synoptic report and comment  B: Lymph nodes, right common iliac, lymphadenectomy - Seven lymph nodes with no metastatic carcinoma identified (0/7)  C: Uterus with cervix and left ovary and fallopian tube, hysterectomy and left salpingo-oophorectomy Cervix: Ectocervix and endocervix with no dysplasia or malignancy identified Endometrium: Endometrium with hemorrhage and weakly proliferative features; no hyperplasia, atypia, or malignancy identified Myometrium: Adenomyosis and benign leiomyomata measuring up to 1.1 cm Ovary, left: Hemorrhagic follicular cyst, small serous inclusions, and physiologic changes Fallopian tube, left: Paratubal cysts and no malignancy identified  D: Lymph nodes, right pelvic, lymphadenectomy - Nine lymph nodes with no metastatic carcinoma identified (0/9)  E: Lymph nodes, left pelvic, lymphadenectomy -  Three lymph nodes with no metastatic  carcinoma identified (0/3)  F: Peritoneum, left abdominal, biopsy - Fibroadipose tissue with no malignancy identified  G: Peritoneum, right pelvic, biopsy - Fibroadipose tissue with no malignancy identified  H: Peritoneum, right abdominal, biopsy - Fibroadipose tissue with no malignancy identified  I: Peritoneum, left pelvic, biopsy - Fibroadipose tissue with no malignancy identified  J: Peritoneum, posterior cul de sac, biopsy - Fibroadipose tissue with calcifications and no malignancy identified  K: Peritoneum, anterior cul de sac, biopsy - Fibroadipose tissue with no malignancy identified  L: Omentum, omentectomy - Adipose and fibrovascular tissue consistent with omentum  - No malignancy identified    02/08/2019 Surgery   Preoperative Diagnoses: Adnexal mass, Obesity BMI 40  Postoperative Diagnoses: Right adnexal mass, fallopian tube adenocarcinoma; Obesity BMI 40  Procedures: Diagnostic laparoscopy, Robotic-assisted total laparoscopic hysterectomy, bilateral salpingo-oophorectomy, bilateral pelvic lymphadenectomy, right common iliac lymphadenectomy, peritoneal biopsies, minilaparotomy for infragastric ometectomy  Surgeon: Janie Morning, MD, PhD  Findings: Normal upper abdominal survey: normal liver surface, diaphragm and stomach, normal appearing small and large bowel, normal omentum. No evidence of peritoneal disease or carcinomatosis. Small amount of ascites in the posterior cul-de-sac upon entry. Enlarged right adnexal mass (~8cm), appears to be arising from the fallopian tube with an otherwise normal appearing ovary. There was no intraoperative, intraperitoneal rupture of the mass; controlled removal of the mass occurred extraperitoneally in a bag. Normal appearing uterus and left adnexa. Normal appearing omentum. Patient with a short mesentery precluding adequate visualization of the aorta. Small nodules in the posterior cul-de-sac, biopsied; no additional peritoneal  disease. R0 resection.    02/20/2019 Cancer Staging   Staging form: Ovary, Fallopian Tube, and Primary Peritoneal Carcinoma, AJCC 8th Edition - Pathologic: Stage II (pT2, pN0, cM0) - Signed by Heath Lark, MD on 02/20/2019   02/28/2019 Tumor Marker   Patient's tumor was tested for the following markers: CA-125 Results of the tumor marker test revealed 26.8.   03/03/2019 Procedure   Successful placement of a right IJ approach Power Port with ultrasound and fluoroscopic guidance. The catheter is ready for use.   03/06/2019 - 06/23/2019 Chemotherapy   The patient had carboplatin and taxol for chemotherapy treatment.     03/27/2019 Tumor Marker   Patient's tumor was tested for the following markers: CA-125. Results of the tumor marker test revealed 8.8   04/17/2019 Tumor Marker   Patient's tumor was tested for the following markers: CA-125 Results of the tumor marker test revealed 8.5   04/25/2019 Genetic Testing   Negative genetic testing:  No pathogenic variants detected on the [facility] TumorNext-HRD+CancerNext panel. Testing did identify two variants of uncertain significance (VUS). One germline VUS was detected in the NBN gene, called c.812T>C (p.V271A). One somatic VUS was detected in the CHEK2 gene, called c.1116_1117delCAinsTG. The report date is 04/25/2019.  The TumorNext-HRD panel offered by [employer] [facility] includes analysis of the tumor for somatic variants in the following 11 genes:  ATM, BARD1, BRIP1, CHEK2, MRE11A, NBN, PALB2, RAD51C, RAD51D, BRCA1, BRCA2 (sequencing only). The CancerNext gene panel offered by [employer] includes sequencing and rearrangement analysis of the blood for germline variants in the following 36 genes:   APC, ATM, AXIN2, BARD1, BMPR1A, BRCA1, BRCA2, BRIP1, CDH1, CDK4, CDKN2A, CHEK2, DICER1, HOXB13, EPCAM, GREM1, MLH1, MSH2, MSH3, MSH6, MUTYH, NBN, NF1, NTHL1, PALB2, PMS2, POLD1, POLE, PTEN, RAD51C, RAD51D, RECQL, SMAD4, SMARCA4, STK11, and TP53.      06/23/2019  Tumor Marker   Patient's tumor was tested for the following  markers: CA-125 Results of the tumor marker test revealed 7.6   07/20/2019 Imaging   1. Status post interval hysterectomy and bilateral oophorectomy as well as omentectomy. 2. No CT evidence of nodal or distant metastatic disease within the abdomen or pelvis. 3. Hepatic steatosis.   07/20/2019 Tumor Marker   Patient's tumor was tested for the following markers: CA-125 Results of the tumor marker test revealed 6.3.   07/27/2019 Procedure   Removal of implanted Port-A-Cath utilizing sharp and blunt dissection. The procedure was uncomplicated.   12/21/2019 Imaging   Ct imaging elsewhere No acute intra-abdominal process.      No evidence of metastatic disease within the pelvis.      Two left lower lobe pulmonary indeterminate nodules as above. These were reported to be stable on the prior CT from [facility] everywhere. Comparison to the priors is recommended.Marland Kitchen       REVIEW OF SYSTEMS:   Constitutional: Denies fevers, chills or abnormal weight loss Eyes: Denies blurriness of vision Ears, nose, mouth, throat, and face: Denies mucositis or sore throat Respiratory: Denies cough, dyspnea or wheezes Cardiovascular: Denies palpitation, chest discomfort or lower extremity swelling Gastrointestinal:  Denies nausea, heartburn or change in bowel habits Skin: Denies abnormal skin rashes Behavioral/Psych: Mood is stable, no new changes  All other systems were reviewed with the patient and are negative.  I have reviewed the past medical history, past surgical history, social history and family history with the patient and they are unchanged from previous note.  ALLERGIES:  has No Known Allergies.  MEDICATIONS:  Current Outpatient Medications  Medication Sig Dispense Refill  . DULoxetine (CYMBALTA) 30 MG capsule Take 1 capsule (30 mg total) by mouth daily. 30 capsule 5  . gabapentin (NEURONTIN) 100 MG capsule Take 1 capsule (100 mg total) by  mouth 3 (three) times daily. (Patient taking differently: Take 100 mg by mouth as needed. ) 90 capsule 11   No current facility-administered medications for this visit.    PHYSICAL EXAMINATION: ECOG PERFORMANCE STATUS: 1 - Symptomatic but completely ambulatory  Vitals:   01/08/20 1026  BP: 118/65  Pulse: 78  Resp: 18  Temp: 97.7 F (36.5 C)  SpO2: 100%   Filed Weights   01/08/20 1026  Weight: 228 lb 6.4 oz (103.6 kg)    GENERAL:alert, no distress and comfortable SKIN: skin color, texture, turgor are normal, no rashes or significant lesions EYES: normal, Conjunctiva are pink and non-injected, sclera clear OROPHARYNX:no exudate, no erythema and lips, buccal mucosa, and tongue normal  NECK: supple, thyroid normal size, non-tender, without nodularity LYMPH:  no palpable lymphadenopathy in the cervical, axillary or inguinal LUNGS: clear to auscultation and percussion with normal breathing effort HEART: regular rate & rhythm and no murmurs and no lower extremity edema ABDOMEN:abdomen soft, mild tenderness in the lower quadrant region, nonspecific Musculoskeletal:no cyanosis of digits and no clubbing  NEURO: alert & oriented x 3 with fluent speech, no focal motor/sensory deficits  LABORATORY DATA:  I have reviewed the data as listed    Component Value Date/Time   NA 140 07/20/2019 1005   K 3.8 07/20/2019 1005   CL 105 07/20/2019 1005   CO2 27 07/20/2019 1005   GLUCOSE 102 (H) 07/20/2019 1005   BUN 17 07/20/2019 1005   CREATININE 0.69 07/20/2019 1005   CALCIUM 9.1 07/20/2019 1005   PROT 7.1 07/20/2019 1005   ALBUMIN 3.5 07/20/2019 1005   AST 33 07/20/2019 1005   ALT 32 07/20/2019 1005   ALKPHOS 75  07/20/2019 1005   BILITOT 0.4 07/20/2019 1005   GFRNONAA >60 07/20/2019 1005   GFRAA >60 07/20/2019 1005    No results found for: SPEP, UPEP  Lab Results  Component Value Date   WBC 7.0 01/08/2020   NEUTROABS 4.8 01/08/2020   HGB 11.8 (L) 01/08/2020   HCT 37.0  01/08/2020   MCV 88.3 01/08/2020   PLT 200 01/08/2020      Chemistry      Component Value Date/Time   NA 140 07/20/2019 1005   K 3.8 07/20/2019 1005   CL 105 07/20/2019 1005   CO2 27 07/20/2019 1005   BUN 17 07/20/2019 1005   CREATININE 0.69 07/20/2019 1005      Component Value Date/Time   CALCIUM 9.1 07/20/2019 1005   ALKPHOS 75 07/20/2019 1005   AST 33 07/20/2019 1005   ALT 32 07/20/2019 1005   BILITOT 0.4 07/20/2019 1005

## 2020-01-08 NOTE — Assessment & Plan Note (Signed)
She has nonspecific mild chronic abdominal pain which I do not believe is related to the disease Her recent CT imaging done at [facility] was negative We will call her with results of CA-125 I will alternate her appointment with her surgeon at [facility] I will see her again in 6 months for further follow-up

## 2020-01-09 ENCOUNTER — Telehealth: Payer: Self-pay

## 2020-01-09 ENCOUNTER — Telehealth: Payer: Self-pay | Admitting: Hematology and Oncology

## 2020-01-09 LAB — CA 125: Cancer Antigen (CA) 125: 5.5 U/mL (ref 0.0–38.1)

## 2020-01-09 NOTE — Telephone Encounter (Signed)
Called and given below message. She verbalized understanding. 

## 2020-01-09 NOTE — Telephone Encounter (Signed)
Scheduled appts per 8/23 sch msg. Pt confirmed appt date and time.

## 2020-01-09 NOTE — Telephone Encounter (Signed)
-----   Message from Heath Lark, MD sent at 01/09/2020  9:01 AM EDT ----- Regarding: let her know CA-125 is normal

## 2020-02-01 ENCOUNTER — Other Ambulatory Visit: Payer: Self-pay | Admitting: Neurology

## 2020-02-19 ENCOUNTER — Telehealth: Payer: Self-pay | Admitting: *Deleted

## 2020-02-19 ENCOUNTER — Encounter: Payer: 59 | Admitting: Neurology

## 2020-02-19 ENCOUNTER — Encounter: Payer: Self-pay | Admitting: Neurology

## 2020-02-19 NOTE — Telephone Encounter (Signed)
No showed NCV/EMG appointment. 

## 2020-06-04 ENCOUNTER — Ambulatory Visit: Payer: 59 | Admitting: Podiatry

## 2020-06-10 ENCOUNTER — Ambulatory Visit: Payer: 59 | Admitting: Podiatry

## 2020-06-10 ENCOUNTER — Other Ambulatory Visit: Payer: Self-pay

## 2020-06-10 ENCOUNTER — Ambulatory Visit (INDEPENDENT_AMBULATORY_CARE_PROVIDER_SITE_OTHER): Payer: 59

## 2020-06-10 DIAGNOSIS — M25572 Pain in left ankle and joints of left foot: Secondary | ICD-10-CM | POA: Diagnosis not present

## 2020-06-10 DIAGNOSIS — T148XXA Other injury of unspecified body region, initial encounter: Secondary | ICD-10-CM

## 2020-06-10 DIAGNOSIS — R600 Localized edema: Secondary | ICD-10-CM

## 2020-06-10 DIAGNOSIS — G8929 Other chronic pain: Secondary | ICD-10-CM

## 2020-06-10 DIAGNOSIS — M25571 Pain in right ankle and joints of right foot: Secondary | ICD-10-CM | POA: Diagnosis not present

## 2020-06-10 DIAGNOSIS — M779 Enthesopathy, unspecified: Secondary | ICD-10-CM | POA: Diagnosis not present

## 2020-06-10 MED ORDER — METHYLPREDNISOLONE 4 MG PO TBPK: ORAL_TABLET | ORAL | 0 refills | Status: DC

## 2020-06-12 NOTE — Progress Notes (Signed)
Subjective:   Patient ID: Caitlin [name], female   DOB: 45 y.o.   MRN: [id number]   HPI 45 year old female presents the office with concerns about ankle pain with the right side worse than left.  She states that she had a fall in August while in [place] and at that time she was seen in urgent care but was told she had a bruise.  She later had an MRI performed in December was told she had a hematoma in the back of her ankle and still swollen and painful.  She has had no recent treatment she reports.  She has never been immobilized and she has had no other treatment.  She states that she has discomfort to the right ankle on a daily basis.   Review of Systems  All other systems reviewed and are negative.  Past Medical History:  Diagnosis Date  . Bacterial vaginitis   . Cancer [facility])    Primary peritoneal carcinoma  . Depression   . Family history of cervical cancer   . Family history of ovarian cancer   . Family history of throat cancer   . Hypercholesterolemia 05/17/2017    Past Surgical History:  Procedure Laterality Date  . ABDOMINAL HYSTERECTOMY    . DIAGNOSTIC LAPAROSCOPY    . DILATION AND CURETTAGE OF UTERUS    . GASTRIC BYPASS  2017  . HYSTEROSCOPY WITH D & C Bilateral 06/07/2013   Procedure: DILATATION AND CURETTAGE /HYSTEROSCOPY with bilateral tubal cathetrization  ;  Surgeon: Governor Specking, MD;  Location: [facility];  Service: Gynecology;  Laterality: Bilateral;  . IR IMAGING GUIDED PORT INSERTION  03/03/2019  . IR REMOVAL TUN ACCESS W/ PORT W/O FL MOD SED  07/27/2019  . LAPAROSCOPY Left 06/07/2013   Procedure: LAPAROSCOPY DIAGNOSTIC with aspiration of left peritubal cyst  ;  Surgeon: Governor Specking, MD;  Location: [facility];  Service: Gynecology;  Laterality: Left;  . LAPAROTOMY Right 06/07/2013   Procedure: LAPAROTOMY with bilateral corneal segmental salpingectomy right corneal anastamosis;  Surgeon: Governor Specking, MD;  Location: [facility];  Service: Gynecology;  Laterality:  Right;     Current Outpatient Medications:  .  DULoxetine (CYMBALTA) 30 MG capsule, TAKE 1 CAPSULE BY MOUTH EVERY DAY, Disp: 90 capsule, Rfl: 1 .  gabapentin (NEURONTIN) 100 MG capsule, Take 1 capsule (100 mg total) by mouth 3 (three) times daily. (Patient taking differently: Take 100 mg by mouth as needed. ), Disp: 90 capsule, Rfl: 11 .  methylPREDNISolone (MEDROL DOSEPAK) 4 MG TBPK tablet, Take as directed, Disp: 21 tablet, Rfl: 0  No Known Allergies        Objective:  Physical Exam  General: AAO x3, NAD  Dermatological: There is no evidence of hematoma identified today there is no areas of fluctuation or crepitation.  No erythema or warmth.  There are no open sores, no preulcerative lesions, no rash or signs of infection present.  Vascular: Dorsalis Pedis artery and Posterior Tibial artery pedal pulses are 2/4 bilateral with immedate capillary fill time. There is no pain with calf compression, swelling, warmth, erythema.   Neruologic: Grossly intact via light touch bilateral. Negative tinel sign.   Musculoskeletal: There is tenderness palpation of the lateral aspect of the right ankle on the course mostly peroneal tendons.  There is no gross ankle instability present.  There is no evidence of hematoma or fluid collection today.  There is similar discomfort in the left ankle on the same area on the peroneal tendons but much less.  Tendons appear to be intact bilaterally.  Muscular strength 5/5 in all groups tested bilateral.  Gait: Unassisted, Nonantalgic.       Assessment:   Peroneal tendinitis, chronic ankle pain     Plan:  -Treatment options discussed including all alternatives, risks, and complications -Etiology of symptoms were discussed -X-rays were obtained and reviewed with the patient.  No evidence of acute fracture identified today. -I did obtain a copy of the MRI report but was not able to visualize the images. -Cam boot was dispensed.  Recommend a Medrol Dosepak  which I ordered today.  Ice elevation.  We will place her symptoms improve when she starts to feel better will refer to physical therapy.  Trula Slade DPM

## 2020-06-25 ENCOUNTER — Other Ambulatory Visit: Payer: Self-pay

## 2020-06-25 ENCOUNTER — Ambulatory Visit: Payer: 59 | Admitting: Podiatry

## 2020-06-25 DIAGNOSIS — G8929 Other chronic pain: Secondary | ICD-10-CM

## 2020-06-25 DIAGNOSIS — M779 Enthesopathy, unspecified: Secondary | ICD-10-CM | POA: Diagnosis not present

## 2020-06-25 DIAGNOSIS — M25571 Pain in right ankle and joints of right foot: Secondary | ICD-10-CM | POA: Diagnosis not present

## 2020-06-25 DIAGNOSIS — R609 Edema, unspecified: Secondary | ICD-10-CM | POA: Diagnosis not present

## 2020-06-25 DIAGNOSIS — M25572 Pain in left ankle and joints of left foot: Secondary | ICD-10-CM

## 2020-07-02 NOTE — Progress Notes (Signed)
Subjective: 45 year old female presents the office today for follow-up evaluation of ankle pain on the right side, hematoma.  Overall she states that she is improving and she has been wearing the cam boot but not all the time she finished a course of steroids.  Some in some discomfort but overall she does state improvement. Denies any systemic complaints such as fevers, chills, nausea, vomiting. No acute changes since last appointment, and no other complaints at this time.   Objective: AAO x3, NAD DP/PT pulses palpable bilaterally, CRT less than 3 seconds There is no area of pinpoint tenderness.  No evidence of hematoma or fluid collection.  There is still some mild discomfort on the lateral aspect of the ankle mostly on the course of the peroneal tendons.  No significant ankle instability is present.  No area of pinpoint tenderness.  MMT 5/5.  No pain with calf compression, swelling, warmth, erythema  Assessment: 45 year old female with ankle swelling, tendinitis  Plan: -All treatment options discussed with the patient including all alternatives, risks, complications.  -Overall improvement with some discomfort.  Referral to physical therapy.  Continue cam boot for now as she starts physical therapy she can start to transition to regular shoe as tolerated.  Continue ice elevate and compression socks. -Patient encouraged to call the office with any questions, concerns, change in symptoms.   Trula Slade DPM

## 2020-07-10 ENCOUNTER — Ambulatory Visit: Payer: 59 | Attending: Podiatry | Admitting: Physical Therapy

## 2020-07-11 ENCOUNTER — Telehealth: Payer: Self-pay | Admitting: Hematology and Oncology

## 2020-07-11 ENCOUNTER — Inpatient Hospital Stay: Payer: 59

## 2020-07-11 ENCOUNTER — Encounter: Payer: Self-pay | Admitting: Hematology and Oncology

## 2020-07-11 ENCOUNTER — Inpatient Hospital Stay: Payer: 59 | Attending: Hematology and Oncology | Admitting: Hematology and Oncology

## 2020-07-11 ENCOUNTER — Other Ambulatory Visit: Payer: Self-pay

## 2020-07-11 VITALS — BP 136/90 | HR 74 | Temp 96.8°F | Resp 20 | Wt 233.0 lb

## 2020-07-11 DIAGNOSIS — C482 Malignant neoplasm of peritoneum, unspecified: Secondary | ICD-10-CM

## 2020-07-11 DIAGNOSIS — Z90710 Acquired absence of both cervix and uterus: Secondary | ICD-10-CM | POA: Diagnosis not present

## 2020-07-11 DIAGNOSIS — Z9221 Personal history of antineoplastic chemotherapy: Secondary | ICD-10-CM | POA: Diagnosis not present

## 2020-07-11 DIAGNOSIS — Z90722 Acquired absence of ovaries, bilateral: Secondary | ICD-10-CM | POA: Insufficient documentation

## 2020-07-11 DIAGNOSIS — Z6841 Body Mass Index (BMI) 40.0 and over, adult: Secondary | ICD-10-CM | POA: Insufficient documentation

## 2020-07-11 DIAGNOSIS — Z8589 Personal history of malignant neoplasm of other organs and systems: Secondary | ICD-10-CM | POA: Insufficient documentation

## 2020-07-11 DIAGNOSIS — T451X5S Adverse effect of antineoplastic and immunosuppressive drugs, sequela: Secondary | ICD-10-CM | POA: Diagnosis not present

## 2020-07-11 DIAGNOSIS — G620 Drug-induced polyneuropathy: Secondary | ICD-10-CM | POA: Diagnosis not present

## 2020-07-11 LAB — COMPREHENSIVE METABOLIC PANEL
ALT: 22 U/L (ref 0–44)
AST: 25 U/L (ref 15–41)
Albumin: 3.5 g/dL (ref 3.5–5.0)
Alkaline Phosphatase: 82 U/L (ref 38–126)
Anion gap: 8 (ref 5–15)
BUN: 14 mg/dL (ref 6–20)
CO2: 27 mmol/L (ref 22–32)
Calcium: 8.9 mg/dL (ref 8.9–10.3)
Chloride: 106 mmol/L (ref 98–111)
Creatinine, Ser: 0.84 mg/dL (ref 0.44–1.00)
GFR, Estimated: >60 mL/min (ref >60)
Glucose, Bld: 97 mg/dL (ref 70–99)
Potassium: 3.8 mmol/L (ref 3.5–5.1)
Sodium: 141 mmol/L (ref 135–145)
Total Bilirubin: 0.4 mg/dL (ref 0.3–1.2)
Total Protein: 7.3 g/dL (ref 6.5–8.1)

## 2020-07-11 LAB — CBC WITH DIFFERENTIAL/PLATELET
Abs Immature Granulocytes: 0.02 10*3/uL (ref 0.00–0.07)
Basophils Absolute: 0 10*3/uL (ref 0.0–0.1)
Basophils Relative: 0 %
Eosinophils Absolute: 0.3 10*3/uL (ref 0.0–0.5)
Eosinophils Relative: 5 %
HCT: 39.8 % (ref 36.0–46.0)
Hemoglobin: 12.2 g/dL (ref 12.0–15.0)
Immature Granulocytes: 0 %
Lymphocytes Relative: 17 %
Lymphs Abs: 1.1 10*3/uL (ref 0.7–4.0)
MCH: 27.8 pg (ref 26.0–34.0)
MCHC: 30.7 g/dL (ref 30.0–36.0)
MCV: 90.7 fL (ref 80.0–100.0)
Monocytes Absolute: 0.5 10*3/uL (ref 0.1–1.0)
Monocytes Relative: 8 %
Neutro Abs: 4.3 10*3/uL (ref 1.7–7.7)
Neutrophils Relative %: 70 %
Platelets: 224 10*3/uL (ref 150–400)
RBC: 4.39 MIL/uL (ref 3.87–5.11)
RDW: 12.1 % (ref 11.5–15.5)
WBC: 6.3 10*3/uL (ref 4.0–10.5)
nRBC: 0 % (ref 0.0–0.2)

## 2020-07-11 NOTE — Assessment & Plan Note (Signed)
She has unusual neuropathy in her hands and not her feet which is not typical for chemotherapy induced neuropathy She was referred to see neurologist last year but did not show up for her tests due to her schedule I encouraged her to call to reschedule

## 2020-07-11 NOTE — Progress Notes (Signed)
[facility] OFFICE PROGRESS NOTE  Patient Care Team: [place]. 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([place]) Clinically, she has no signs or symptoms to suggest cancer recurrence Tumor marker is pending For some reason, her surgeon saw her recently I plan to change her appointment I will see her again in 3 months If she has no signs of recurrence, I would defer future follow-up to her GYN surgeon She is in agreement  Obesity, Class III, BMI 40-49.9 (morbid obesity) ([place]) She has significant class III obesity We discussed the associated risk of cancer recurrence with obesity The patient will attend lifestyle modification and weight loss  Peripheral neuropathy due to chemotherapy [facility]) She has unusual neuropathy in her hands and not her feet which is not typical for chemotherapy induced neuropathy She was referred to see neurologist last year but did not show up for her tests due to her schedule I encouraged her to call to reschedule   No orders of the defined types were placed in this encounter.   All questions were answered. The patient knows to call the clinic with any problems, questions or concerns. The total time spent in the appointment was 20 minutes encounter with patients including review of chart and various tests results, discussions about plan of care and coordination of care plan   Heath Lark, MD 07/11/2020 9:27 AM  INTERVAL HISTORY: Please see below for problem oriented charting. She returns for further follow-up She is doing well She still have some residual neuropathy affecting the tips of her fingers but not on her feet She has gained a lot of weight since last time I saw her She has frequent hot flashes Denies abdominal pain, abnormal vaginal bleeding, changes in bowel habits or bloating  SUMMARY OF ONCOLOGIC HISTORY: Oncology  History Overview Note  Negative genetics Endometrioid   Primary peritoneal adenocarcinoma ([place])  11/24/2018 Imaging   CT chest with IV contrast elsewhere Stable small lung nodules, the largest is in the left lower lobe measuring 6 mm. These are unchanged from November 2017 and are benign in nature. No new nodules.    01/03/2019 Imaging   MRI pelvis Uterus: The uterus measures 5.8 x 9.3 x 5.8 cm. Unremarkable ENDOMETRIUM. Multiple uterine body intramural fibroids measuring up to 14 mm in size.  Mild endometrial fluid.   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 - Carcinoma appears to be arising in adnexal soft tissue, considered most consistent with stage pT2 (see comment) - Ovary with physiologic changes and and fallopian tube with paratubal cyst and surface adhesions; no definite parenchymal involvement by carcinoma identified - See synoptic report and comment  B: Lymph nodes, right common iliac, lymphadenectomy - Seven lymph nodes with no metastatic carcinoma identified (0/7)  C: Uterus with cervix and left ovary and fallopian tube, hysterectomy and left salpingo-oophorectomy Cervix: Ectocervix and endocervix with no dysplasia or malignancy identified Endometrium: Endometrium with hemorrhage and weakly proliferative features; no hyperplasia, atypia, or malignancy identified Myometrium: Adenomyosis and benign leiomyomata measuring up to 1.1 cm Ovary, left: Hemorrhagic follicular cyst, small serous inclusions, and physiologic changes Fallopian tube, left: Paratubal cysts and no malignancy identified  D: Lymph nodes, right pelvic, lymphadenectomy - Nine lymph nodes with no metastatic carcinoma identified (0/9)  E: Lymph nodes, left pelvic, lymphadenectomy - Three lymph nodes  with no metastatic carcinoma identified (0/3)  F: Peritoneum, left abdominal, biopsy -  Fibroadipose tissue with no malignancy identified  G: Peritoneum, right pelvic, biopsy - Fibroadipose tissue with no malignancy identified  H: Peritoneum, right abdominal, biopsy - Fibroadipose tissue with no malignancy identified  I: Peritoneum, left pelvic, biopsy - Fibroadipose tissue with no malignancy identified  J: Peritoneum, posterior cul de sac, biopsy - Fibroadipose tissue with calcifications and no malignancy identified  K: Peritoneum, anterior cul de sac, biopsy - Fibroadipose tissue with no malignancy identified  L: Omentum, omentectomy - Adipose and fibrovascular tissue consistent with omentum  - No malignancy identified    02/08/2019 Surgery   Preoperative Diagnoses: Adnexal mass, Obesity BMI 40  Postoperative Diagnoses: Right adnexal mass, fallopian tube adenocarcinoma; Obesity BMI 40  Procedures: Diagnostic laparoscopy, Robotic-assisted total laparoscopic hysterectomy, bilateral salpingo-oophorectomy, bilateral pelvic lymphadenectomy, right common iliac lymphadenectomy, peritoneal biopsies, minilaparotomy for infragastric ometectomy  Surgeon: Janie Morning, MD, PhD  Findings: Normal upper abdominal survey: normal liver surface, diaphragm and stomach, normal appearing small and large bowel, normal omentum. No evidence of peritoneal disease or carcinomatosis. Small amount of ascites in the posterior cul-de-sac upon entry. Enlarged right adnexal mass (~8cm), appears to be arising from the fallopian tube with an otherwise normal appearing ovary. There was no intraoperative, intraperitoneal rupture of the mass; controlled removal of the mass occurred extraperitoneally in a bag. Normal appearing uterus and left adnexa. Normal appearing omentum. Patient with a short mesentery precluding adequate visualization of the aorta. Small nodules in the posterior cul-de-sac, biopsied; no additional peritoneal disease. R0 resection.    02/20/2019 Cancer Staging   Staging form: Ovary,  Fallopian Tube, and Primary Peritoneal Carcinoma, AJCC 8th Edition - Pathologic: Stage II (pT2, pN0, cM0) - Signed by Heath Lark, MD on 02/20/2019   02/28/2019 Tumor Marker   Patient's tumor was tested for the following markers: CA-125 Results of the tumor marker test revealed 26.8.   03/03/2019 Procedure   Successful placement of a right IJ approach Power Port with ultrasound and fluoroscopic guidance. The catheter is ready for use.   03/06/2019 - 06/23/2019 Chemotherapy   The patient had carboplatin and taxol for chemotherapy treatment.     03/27/2019 Tumor Marker   Patient's tumor was tested for the following markers: CA-125. Results of the tumor marker test revealed 8.8   04/17/2019 Tumor Marker   Patient's tumor was tested for the following markers: CA-125 Results of the tumor marker test revealed 8.5   04/25/2019 Genetic Testing   Negative genetic testing:  No pathogenic variants detected on the [facility] TumorNext-HRD+CancerNext panel. Testing did identify two variants of uncertain significance (VUS). One germline VUS was detected in the NBN gene, called c.812T>C (p.V271A). One somatic VUS was detected in the CHEK2 gene, called c.1116_1117delCAinsTG. The report date is 04/25/2019.  The TumorNext-HRD panel offered by [employer] includes analysis of the tumor for somatic variants in the following 11 genes:  ATM, BARD1, BRIP1, CHEK2, MRE11A, NBN, PALB2, RAD51C, RAD51D, BRCA1, BRCA2 (sequencing only). The CancerNext gene panel offered by [employer] includes sequencing and rearrangement analysis of the blood for germline variants in the following 36 genes:   APC, ATM, AXIN2, BARD1, BMPR1A, BRCA1, BRCA2, BRIP1, CDH1, CDK4, CDKN2A, CHEK2, DICER1, HOXB13, EPCAM, GREM1, MLH1, MSH2, MSH3, MSH6, MUTYH, NBN, NF1, NTHL1, PALB2, PMS2, POLD1, POLE, PTEN, RAD51C, RAD51D, RECQL, SMAD4, SMARCA4, STK11, and TP53.      06/23/2019 Tumor Marker   Patient's tumor was tested for the following markers:  CA-125 Results  of the tumor marker test revealed 7.6   07/20/2019 Imaging   1. Status post interval hysterectomy and bilateral oophorectomy as well as omentectomy. 2. No CT evidence of nodal or distant metastatic disease within the abdomen or pelvis. 3. Hepatic steatosis.   07/20/2019 Tumor Marker   Patient's tumor was tested for the following markers: CA-125 Results of the tumor marker test revealed 6.3.   07/27/2019 Procedure   Removal of implanted Port-A-Cath utilizing sharp and blunt dissection. The procedure was uncomplicated.   12/21/2019 Imaging   Ct imaging elsewhere No acute intra-abdominal process.      No evidence of metastatic disease within the pelvis.      Two left lower lobe pulmonary indeterminate nodules as above. These were reported to be stable on the prior CT from [facility]. Comparison to the priors is recommended..     01/08/2020 Tumor Marker   Patient's tumor was tested for the following markers: CA-125 Results of the tumor marker test revealed 5.5.     REVIEW OF SYSTEMS:   Constitutional: Denies fevers, chills or abnormal weight loss Eyes: Denies blurriness of vision Ears, nose, mouth, throat, and face: Denies mucositis or sore throat Respiratory: Denies cough, dyspnea or wheezes Cardiovascular: Denies palpitation, chest discomfort or lower extremity swelling Gastrointestinal:  Denies nausea, heartburn or change in bowel habits Skin: Denies abnormal skin rashes Lymphatics: Denies new lymphadenopathy or easy bruising Behavioral/Psych: Mood is stable, no new changes  All other systems were reviewed with the patient and are negative.  I have reviewed the past medical history, past surgical history, social history and family history with the patient and they are unchanged from previous note.  ALLERGIES:  has No Known Allergies.  MEDICATIONS:  No current outpatient medications on file.   No current facility-administered medications for this visit.     PHYSICAL EXAMINATION: ECOG PERFORMANCE STATUS: 1 - Symptomatic but completely ambulatory  Vitals:   07/11/20 0817  BP: 136/90  Pulse: 74  Resp: 20  Temp: (!) 96.8 F (36 C)  SpO2: 99%   Filed Weights   07/11/20 0817  Weight: 233 lb (105.7 kg)    GENERAL:alert, no distress and comfortable SKIN: skin color, texture, turgor are normal, no rashes or significant lesions EYES: normal, Conjunctiva are pink and non-injected, sclera clear OROPHARYNX:no exudate, no erythema and lips, buccal mucosa, and tongue normal  NECK: supple, thyroid normal size, non-tender, without nodularity LYMPH:  no palpable lymphadenopathy in the cervical, axillary or inguinal LUNGS: clear to auscultation and percussion with normal breathing effort HEART: regular rate & rhythm and no murmurs and no lower extremity edema ABDOMEN:abdomen soft, non-tender and normal bowel sounds Musculoskeletal:no cyanosis of digits and no clubbing  NEURO: alert & oriented x 3 with fluent speech, no focal motor/sensory deficits  LABORATORY DATA:  I have reviewed the data as listed    Component Value Date/Time   NA 141 07/11/2020 0804   K 3.8 07/11/2020 0804   CL 106 07/11/2020 0804   CO2 27 07/11/2020 0804   GLUCOSE 97 07/11/2020 0804   BUN 14 07/11/2020 0804   CREATININE 0.84 07/11/2020 0804   CREATININE 0.69 07/20/2019 1005   CALCIUM 8.9 07/11/2020 0804   PROT 7.3 07/11/2020 0804   ALBUMIN 3.5 07/11/2020 0804   AST 25 07/11/2020 0804   AST 33 07/20/2019 1005   ALT 22 07/11/2020 0804   ALT 32 07/20/2019 1005   ALKPHOS 82 07/11/2020 0804   BILITOT 0.4 07/11/2020 0804   BILITOT 0.4 07/20/2019  1005   GFRNONAA >60 07/11/2020 0804   GFRNONAA >60 07/20/2019 1005   GFRAA >60 01/08/2020 1007   GFRAA >60 07/20/2019 1005    No results found for: SPEP, UPEP  Lab Results  Component Value Date   WBC 6.3 07/11/2020   NEUTROABS 4.3 07/11/2020   HGB 12.2 07/11/2020   HCT 39.8 07/11/2020   MCV 90.7 07/11/2020    PLT 224 07/11/2020      Chemistry      Component Value Date/Time   NA 141 07/11/2020 0804   K 3.8 07/11/2020 0804   CL 106 07/11/2020 0804   CO2 27 07/11/2020 0804   BUN 14 07/11/2020 0804   CREATININE 0.84 07/11/2020 0804   CREATININE 0.69 07/20/2019 1005      Component Value Date/Time   CALCIUM 8.9 07/11/2020 0804   ALKPHOS 82 07/11/2020 0804   AST 25 07/11/2020 0804   AST 33 07/20/2019 1005   ALT 22 07/11/2020 0804   ALT 32 07/20/2019 1005   BILITOT 0.4 07/11/2020 0804   BILITOT 0.4 07/20/2019 1005

## 2020-07-11 NOTE — Assessment & Plan Note (Signed)
Clinically, she has no signs or symptoms to suggest cancer recurrence Tumor marker is pending For some reason, her surgeon saw her recently I plan to change her appointment I will see her again in 3 months If she has no signs of recurrence, I would defer future follow-up to her GYN surgeon She is in agreement

## 2020-07-11 NOTE — Telephone Encounter (Signed)
Scheduled appointments per 2/24 sch msg. Spoke to patient who is aware of appointments date and times.

## 2020-07-11 NOTE — Assessment & Plan Note (Signed)
She has significant class III obesity We discussed the associated risk of cancer recurrence with obesity The patient will attend lifestyle modification and weight loss

## 2020-07-12 ENCOUNTER — Telehealth: Payer: Self-pay

## 2020-07-12 LAB — CA 125: Cancer Antigen (CA) 125: 4.8 U/mL (ref 0.0–38.1)

## 2020-07-12 NOTE — Telephone Encounter (Signed)
-----   Message from Heath Lark, MD sent at 07/12/2020  8:41 AM EST ----- Regarding: Pls call and let her know CA-125 is normal

## 2020-07-12 NOTE — Telephone Encounter (Signed)
Called and left below message. Ask her to call the office back for questions. 

## 2020-08-06 ENCOUNTER — Ambulatory Visit: Payer: 59 | Admitting: Podiatry

## 2020-10-08 ENCOUNTER — Inpatient Hospital Stay: Payer: 59 | Attending: Hematology and Oncology

## 2020-10-08 ENCOUNTER — Other Ambulatory Visit: Payer: Self-pay

## 2020-10-08 ENCOUNTER — Telehealth: Payer: Self-pay | Admitting: Hematology and Oncology

## 2020-10-08 ENCOUNTER — Inpatient Hospital Stay (HOSPITAL_BASED_OUTPATIENT_CLINIC_OR_DEPARTMENT_OTHER): Payer: 59 | Admitting: Hematology and Oncology

## 2020-10-08 DIAGNOSIS — Z9221 Personal history of antineoplastic chemotherapy: Secondary | ICD-10-CM | POA: Diagnosis not present

## 2020-10-08 DIAGNOSIS — C482 Malignant neoplasm of peritoneum, unspecified: Secondary | ICD-10-CM

## 2020-10-08 DIAGNOSIS — Z90722 Acquired absence of ovaries, bilateral: Secondary | ICD-10-CM | POA: Diagnosis not present

## 2020-10-08 DIAGNOSIS — Z6841 Body Mass Index (BMI) 40.0 and over, adult: Secondary | ICD-10-CM | POA: Insufficient documentation

## 2020-10-08 DIAGNOSIS — Z90710 Acquired absence of both cervix and uterus: Secondary | ICD-10-CM | POA: Insufficient documentation

## 2020-10-08 LAB — CBC WITH DIFFERENTIAL/PLATELET
Abs Immature Granulocytes: 0.02 10*3/uL (ref 0.00–0.07)
Basophils Absolute: 0 10*3/uL (ref 0.0–0.1)
Basophils Relative: 0 %
Eosinophils Absolute: 0.3 10*3/uL (ref 0.0–0.5)
Eosinophils Relative: 4 %
HCT: 40.4 % (ref 36.0–46.0)
Hemoglobin: 12.9 g/dL (ref 12.0–15.0)
Immature Granulocytes: 0 %
Lymphocytes Relative: 17 %
Lymphs Abs: 1.2 10*3/uL (ref 0.7–4.0)
MCH: 27.8 pg (ref 26.0–34.0)
MCHC: 31.9 g/dL (ref 30.0–36.0)
MCV: 87.1 fL (ref 80.0–100.0)
Monocytes Absolute: 0.5 10*3/uL (ref 0.1–1.0)
Monocytes Relative: 7 %
Neutro Abs: 5.2 10*3/uL (ref 1.7–7.7)
Neutrophils Relative %: 72 %
Platelets: 231 10*3/uL (ref 150–400)
RBC: 4.64 MIL/uL (ref 3.87–5.11)
RDW: 12.1 % (ref 11.5–15.5)
WBC: 7.2 10*3/uL (ref 4.0–10.5)
nRBC: 0 % (ref 0.0–0.2)

## 2020-10-08 LAB — COMPREHENSIVE METABOLIC PANEL
ALT: 25 U/L (ref 0–44)
AST: 25 U/L (ref 15–41)
Albumin: 3.7 g/dL (ref 3.5–5.0)
Alkaline Phosphatase: 93 U/L (ref 38–126)
Anion gap: 8 (ref 5–15)
BUN: 11 mg/dL (ref 6–20)
CO2: 30 mmol/L (ref 22–32)
Calcium: 9.4 mg/dL (ref 8.9–10.3)
Chloride: 103 mmol/L (ref 98–111)
Creatinine, Ser: 0.79 mg/dL (ref 0.44–1.00)
GFR, Estimated: >60 mL/min (ref >60)
Glucose, Bld: 119 mg/dL — ABNORMAL HIGH (ref 70–99)
Potassium: 4 mmol/L (ref 3.5–5.1)
Sodium: 141 mmol/L (ref 135–145)
Total Bilirubin: 0.5 mg/dL (ref 0.3–1.2)
Total Protein: 7.9 g/dL (ref 6.5–8.1)

## 2020-10-08 NOTE — Telephone Encounter (Signed)
Scheduled appt per 5/24 sch msg. Pt aware.

## 2020-10-09 ENCOUNTER — Encounter: Payer: Self-pay | Admitting: Hematology and Oncology

## 2020-10-09 ENCOUNTER — Telehealth: Payer: Self-pay

## 2020-10-09 LAB — CA 125: Cancer Antigen (CA) 125: 6.1 U/mL (ref 0.0–38.1)

## 2020-10-09 NOTE — Assessment & Plan Note (Signed)
She has no signs or symptoms to suggest recurrence of cancer She has minimal residual peripheral neuropathy from treatment which I anticipate will continue to improve in the future She will see Dr. Skeet Latch at [facility] in August and I will see her back at the end of the year for further follow-up The patient is educated to watch out for signs and symptoms of cancer recurrence

## 2020-10-09 NOTE — Telephone Encounter (Signed)
-----   Message from Heath Lark, MD sent at 10/09/2020  8:40 AM EDT ----- Pls let her know CA 125 is normal

## 2020-10-09 NOTE — Progress Notes (Signed)
[facility] OFFICE PROGRESS NOTE  Patient Care Team: [place]. 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 Skeet Latch at [facility] in August and I will see her back at the end of the year for further follow-up The patient is educated to watch out for signs and symptoms of cancer recurrence  Obesity, Class III, BMI 40-49.9 (morbid obesity) ([place]) She has significant class III obesity We discussed the associated risk of cancer recurrence with obesity I encouraged lifestyle modification and weight loss   No orders of the defined types were placed in this encounter.   All questions were answered. The patient knows to call the clinic with any problems, questions or concerns. The total time spent in the appointment was 20 minutes encounter with patients including review of chart and various tests results, discussions about plan of care and coordination of care plan   Heath Lark, MD 10/09/2020 11:23 AM  INTERVAL HISTORY: Please see below for problem oriented charting. She returns for further follow-up She is doing well No recent abdominal pain, nausea or changes in bowel habits Denies abdominal bloating She has intermittent hot flashes but manageable without medications  SUMMARY OF ONCOLOGIC HISTORY: Oncology History Overview Note  Negative genetics Endometrioid   Primary peritoneal adenocarcinoma ([place])  11/24/2018 Imaging   CT chest with IV contrast elsewhere Stable small lung nodules, the largest is in the left lower lobe measuring 6 mm. These are unchanged from November 2017 and are benign in nature. No new nodules.    01/03/2019 Imaging   MRI pelvis Uterus: The  uterus measures 5.8 x 9.3 x 5.8 cm. Unremarkable ENDOMETRIUM. Multiple uterine body intramural fibroids measuring up to 14 mm in size.  Mild endometrial fluid.   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 - Carcinoma appears to be arising in adnexal soft tissue, considered most consistent with stage pT2 (see comment) - Ovary with physiologic changes and and fallopian tube with paratubal cyst and surface adhesions; no definite parenchymal involvement by carcinoma identified - See synoptic report and comment  B: Lymph nodes, right common iliac, lymphadenectomy - Seven lymph nodes with no metastatic carcinoma identified (0/7)  C: Uterus with cervix and left ovary and fallopian tube, hysterectomy and left salpingo-oophorectomy Cervix: Ectocervix and endocervix with no dysplasia or malignancy identified Endometrium: Endometrium with hemorrhage and weakly proliferative features; no hyperplasia, atypia, or malignancy identified Myometrium: Adenomyosis and benign leiomyomata measuring up to 1.1 cm Ovary, left: Hemorrhagic follicular cyst, small serous inclusions, and physiologic changes Fallopian tube, left: Paratubal cysts and no malignancy identified  D: Lymph nodes, right pelvic, lymphadenectomy - Nine lymph nodes with no metastatic carcinoma identified (0/9)  E: Lymph nodes, left pelvic, lymphadenectomy - Three lymph nodes with no metastatic carcinoma identified (0/3)  F: Peritoneum, left abdominal, biopsy - Fibroadipose tissue with no malignancy identified  G: Peritoneum, right pelvic, biopsy - Fibroadipose tissue with no malignancy identified  H: Peritoneum, right abdominal, biopsy - Fibroadipose tissue with no malignancy identified  I: Peritoneum, left pelvic, biopsy - Fibroadipose tissue with no malignancy identified  J: Peritoneum, posterior cul de sac,  biopsy - Fibroadipose tissue  with calcifications and no malignancy identified  K: Peritoneum, anterior cul de sac, biopsy - Fibroadipose tissue with no malignancy identified  L: Omentum, omentectomy - Adipose and fibrovascular tissue consistent with omentum  - No malignancy identified    02/08/2019 Surgery   Preoperative Diagnoses: Adnexal mass, Obesity BMI 40  Postoperative Diagnoses: Right adnexal mass, fallopian tube adenocarcinoma; Obesity BMI 40  Procedures: Diagnostic laparoscopy, Robotic-assisted total laparoscopic hysterectomy, bilateral salpingo-oophorectomy, bilateral pelvic lymphadenectomy, right common iliac lymphadenectomy, peritoneal biopsies, minilaparotomy for infragastric ometectomy  Surgeon: Janie Morning, MD, PhD  Findings: Normal upper abdominal survey: normal liver surface, diaphragm and stomach, normal appearing small and large bowel, normal omentum. No evidence of peritoneal disease or carcinomatosis. Small amount of ascites in the posterior cul-de-sac upon entry. Enlarged right adnexal mass (~8cm), appears to be arising from the fallopian tube with an otherwise normal appearing ovary. There was no intraoperative, intraperitoneal rupture of the mass; controlled removal of the mass occurred extraperitoneally in a bag. Normal appearing uterus and left adnexa. Normal appearing omentum. Patient with a short mesentery precluding adequate visualization of the aorta. Small nodules in the posterior cul-de-sac, biopsied; no additional peritoneal disease. R0 resection.    02/20/2019 Cancer Staging   Staging form: Ovary, Fallopian Tube, and Primary Peritoneal Carcinoma, AJCC 8th Edition - Pathologic: Stage II (pT2, pN0, cM0) - Signed by Heath Lark, MD on 02/20/2019   02/28/2019 Tumor Marker   Patient's tumor was tested for the following markers: CA-125 Results of the tumor marker test revealed 26.8.   03/03/2019 Procedure   Successful placement of a right IJ approach Power  Port with ultrasound and fluoroscopic guidance. The catheter is ready for use.   03/06/2019 - 06/23/2019 Chemotherapy   The patient had carboplatin and taxol for chemotherapy treatment.     03/27/2019 Tumor Marker   Patient's tumor was tested for the following markers: CA-125. Results of the tumor marker test revealed 8.8   04/17/2019 Tumor Marker   Patient's tumor was tested for the following markers: CA-125 Results of the tumor marker test revealed 8.5   04/25/2019 Genetic Testing   Negative genetic testing:  No pathogenic variants detected on the [employer] TumorNext-HRD+CancerNext panel. Testing did identify two variants of uncertain significance (VUS). One germline VUS was detected in the NBN gene, called c.812T>C (p.V271A). One somatic VUS was detected in the CHEK2 gene, called c.1116_1117delCAinsTG. The report date is 04/25/2019.  The TumorNext-HRD panel offered by [employer] includes analysis of the tumor for somatic variants in the following 11 genes:  ATM, BARD1, BRIP1, CHEK2, MRE11A, NBN, PALB2, RAD51C, RAD51D, BRCA1, BRCA2 (sequencing only). The CancerNext gene panel offered by [employer] includes sequencing and rearrangement analysis of the blood for germline variants in the following 36 genes:   APC, ATM, AXIN2, BARD1, BMPR1A, BRCA1, BRCA2, BRIP1, CDH1, CDK4, CDKN2A, CHEK2, DICER1, HOXB13, EPCAM, GREM1, MLH1, MSH2, MSH3, MSH6, MUTYH, NBN, NF1, NTHL1, PALB2, PMS2, POLD1, POLE, PTEN, RAD51C, RAD51D, RECQL, SMAD4, SMARCA4, STK11, and TP53.      06/23/2019 Tumor Marker   Patient's tumor was tested for the following markers: CA-125 Results of the tumor marker test revealed 7.6   07/20/2019 Imaging   1. Status post interval hysterectomy and bilateral oophorectomy as well as omentectomy. 2. No CT evidence of nodal or distant metastatic disease within the abdomen or pelvis. 3. Hepatic steatosis.   07/20/2019 Tumor Marker   Patient's tumor was tested for the following markers:  CA-125 Results of the tumor marker test revealed 6.3.   07/27/2019  Procedure   Removal of implanted Port-A-Cath utilizing sharp and blunt dissection. The procedure was uncomplicated.   12/21/2019 Imaging   Ct imaging elsewhere No acute intra-abdominal process.      No evidence of metastatic disease within the pelvis.      Two left lower lobe pulmonary indeterminate nodules as above. These were reported to be stable on the prior CT from [facility]. Comparison to the priors is recommended..     01/08/2020 Tumor Marker   Patient's tumor was tested for the following markers: CA-125 Results of the tumor marker test revealed 5.5.   07/11/2020 Tumor Marker   Patient's tumor was tested for the following markers: CA-125 Results of the tumor marker test revealed 4.8.   10/08/2020 Tumor Marker   Patient's tumor was tested for the following markers: CA-125 Results of the tumor marker test revealed 6.1     REVIEW OF SYSTEMS:   Constitutional: Denies fevers, chills or abnormal weight loss Eyes: Denies blurriness of vision Ears, nose, mouth, throat, and face: Denies mucositis or sore throat Respiratory: Denies cough, dyspnea or wheezes Cardiovascular: Denies palpitation, chest discomfort or lower extremity swelling Gastrointestinal:  Denies nausea, heartburn or change in bowel habits Skin: Denies abnormal skin rashes Lymphatics: Denies new lymphadenopathy or easy bruising Neurological:Denies numbness, tingling or new weaknesses Behavioral/Psych: Mood is stable, no new changes  All other systems were reviewed with the patient and are negative.  I have reviewed the past medical history, past surgical history, social history and family history with the patient and they are unchanged from previous note.  ALLERGIES:  has No Known Allergies.  MEDICATIONS:  Current Outpatient Medications  Medication Sig Dispense Refill  . Multiple Vitamin (MULTIVITAMIN) tablet Take 1 tablet by mouth daily.      No current facility-administered medications for this visit.    PHYSICAL EXAMINATION: ECOG PERFORMANCE STATUS: 0 - Asymptomatic  Vitals:   10/08/20 0840  BP: (!) 153/88  Pulse: 82  Resp: 20  Temp: (!) 97.1 F (36.2 C)  SpO2: 99%   Filed Weights   10/08/20 0840  Weight: 232 lb (105.2 kg)    GENERAL:alert, no distress and comfortable SKIN: skin color, texture, turgor are normal, no rashes or significant lesions EYES: normal, Conjunctiva are pink and non-injected, sclera clear OROPHARYNX:no exudate, no erythema and lips, buccal mucosa, and tongue normal  NECK: supple, thyroid normal size, non-tender, without nodularity LYMPH:  no palpable lymphadenopathy in the cervical, axillary or inguinal LUNGS: clear to auscultation and percussion with normal breathing effort HEART: regular rate & rhythm and no murmurs and no lower extremity edema ABDOMEN:abdomen soft, non-tender and normal bowel sounds Musculoskeletal:no cyanosis of digits and no clubbing  NEURO: alert & oriented x 3 with fluent speech, no focal motor/sensory deficits  LABORATORY DATA:  I have reviewed the data as listed    Component Value Date/Time   NA 141 10/08/2020 0824   K 4.0 10/08/2020 0824   CL 103 10/08/2020 0824   CO2 30 10/08/2020 0824   GLUCOSE 119 (H) 10/08/2020 0824   BUN 11 10/08/2020 0824   CREATININE 0.79 10/08/2020 0824   CREATININE 0.69 07/20/2019 1005   CALCIUM 9.4 10/08/2020 0824   PROT 7.9 10/08/2020 0824   ALBUMIN 3.7 10/08/2020 0824   AST 25 10/08/2020 0824   AST 33 07/20/2019 1005   ALT 25 10/08/2020 0824   ALT 32 07/20/2019 1005   ALKPHOS 93 10/08/2020 0824   BILITOT 0.5 10/08/2020 0824   BILITOT 0.4 07/20/2019 1005  GFRNONAA >60 10/08/2020 0824   GFRNONAA >60 07/20/2019 1005   GFRAA >60 01/08/2020 1007   GFRAA >60 07/20/2019 1005    No results found for: SPEP, UPEP  Lab Results  Component Value Date   WBC 7.2 10/08/2020   NEUTROABS 5.2 10/08/2020   HGB 12.9 10/08/2020    HCT 40.4 10/08/2020   MCV 87.1 10/08/2020   PLT 231 10/08/2020      Chemistry      Component Value Date/Time   NA 141 10/08/2020 0824   K 4.0 10/08/2020 0824   CL 103 10/08/2020 0824   CO2 30 10/08/2020 0824   BUN 11 10/08/2020 0824   CREATININE 0.79 10/08/2020 0824   CREATININE 0.69 07/20/2019 1005      Component Value Date/Time   CALCIUM 9.4 10/08/2020 0824   ALKPHOS 93 10/08/2020 0824   AST 25 10/08/2020 0824   AST 33 07/20/2019 1005   ALT 25 10/08/2020 0824   ALT 32 07/20/2019 1005   BILITOT 0.5 10/08/2020 0824   BILITOT 0.4 07/20/2019 1005

## 2020-10-09 NOTE — Assessment & Plan Note (Signed)
She has significant class III obesity We discussed the associated risk of cancer recurrence with obesity I encouraged lifestyle modification and weight loss

## 2020-10-09 NOTE — Telephone Encounter (Signed)
Called and given below message. She verbalzied understanding. 

## 2021-01-01 ENCOUNTER — Encounter: Payer: 59 | Admitting: Neurology

## 2021-02-19 ENCOUNTER — Ambulatory Visit (INDEPENDENT_AMBULATORY_CARE_PROVIDER_SITE_OTHER): Payer: 59 | Admitting: Neurology

## 2021-02-19 ENCOUNTER — Ambulatory Visit: Payer: 59 | Admitting: Neurology

## 2021-02-19 DIAGNOSIS — G5601 Carpal tunnel syndrome, right upper limb: Secondary | ICD-10-CM | POA: Insufficient documentation

## 2021-02-19 DIAGNOSIS — R202 Paresthesia of skin: Secondary | ICD-10-CM

## 2021-02-19 DIAGNOSIS — C482 Malignant neoplasm of peritoneum, unspecified: Secondary | ICD-10-CM

## 2021-02-19 MED ORDER — DULOXETINE HCL 30 MG PO CPEP: 30.0000 mg | ORAL_CAPSULE | Freq: Every day | ORAL | 5 refills | Status: DC

## 2021-02-19 NOTE — Procedures (Signed)
Full Name: Caitlin [name] Gender: Female MRN #: [id number] Date of Birth: 1975/07/19    Visit Date: 02/19/2021 07:22 Age: 45 Years Examining Physician: Marcial Pacas, MD  Referring Physician: Marcial Pacas, MD History: 45 year old female, had a history of ovarian cancer treatment, now complains of bilateral lower extremity, and intermittent upper extremity paresthesia  Summary of the test:  Nerve conduction study:  Right sural, superficial peroneal, ulnar sensory responses were normal.  Right median sensory responses peak latency was mildly prolonged, with mildly decreased snap amplitude.  Left median sensory responses were within normal limits  Bilateral median mixed response was more than 0.4 ms prolonged in comparison to ipsilateral ulnar mixed response  Right tibial, peroneal to EDB, ulnar, bilateral median motor responses were normal.  Electromyography:  Selected needle examination of right upper extremity muscles were normal, with exception of slightly decreased recruitment pattern at right abductor pollicis brevis.   Conclusion: This is an abnormal study.  There is electrodiagnostic evidence of mild bilateral median neuropathy across the wrist, consistent with mild bilateral carpal tunnel syndromes, right worse than left, demyelinating in nature, there is no evidence of axonal loss.  There is no evidence of right cervical radiculopathy.    ------------------------------- Marcial Pacas M.D. PhD  [facility] [address] [place], [place] [address] Tel: [phone] Fax: [phone]  Verbal informed consent was obtained from the patient, patient was informed of potential risk of procedure, including bruising, bleeding, hematoma formation, infection, muscle weakness, muscle pain, numbness, among others.        [place]    Nerve / Sites Muscle Latency Ref. Amplitude Ref. Rel Amp Segments Distance Velocity Ref. Area    ms ms mV mV %  cm m/s m/s mVms   R Median - APB     Wrist APB 4.4 ?4.4 6.3 ?4.0 100 Wrist - APB 7   20.8     Upper arm APB 8.1  6.0  95 Upper arm - Wrist 20 54 ?49 20.3  L Median - APB     Wrist APB 3.7 ?4.4 8.6 ?4.0 100 Wrist - APB 7   25.8     Upper arm APB 7.0  7.8  90.8 Upper arm - Wrist 18 54 ?49 23.4  R Ulnar - ADM     Wrist ADM 2.5 ?3.3 7.2 ?6.0 100 Wrist - ADM 7   19.6     B.Elbow ADM 5.3  9.3  129 B.Elbow - Wrist 18 66 ?49 17.1     A.Elbow ADM 6.5  11.0  118 A.Elbow - B.Elbow 8 62 ?49 20.1  R Peroneal - EDB     Ankle EDB 3.7 ?6.5 5.2 ?2.0 100 Ankle - EDB 9   17.1     Fib head EDB 8.8  3.2  61.2 Fib head - Ankle 25 49 ?44 12.2     Pop fossa EDB 10.4  3.9  124 Pop fossa - Fib head 8 49 ?44 14.7         Pop fossa - Ankle      R Tibial - AH     Ankle AH 3.4 ?5.8 9.6 ?4.0 100 Ankle - AH 9   17.0     Pop fossa AH 10.8  6.8  70.2 Pop fossa - Ankle 35 47 ?41 15.4                SNC    Nerve / Sites Rec. Site Peak Lat Ref.  Amp Ref. Segments Distance Peak Diff Ref.    ms ms V V  cm ms ms  R Sural - Ankle (Calf)     Calf Ankle 3.0 ?4.4 8 ?6 Calf - Ankle 14    R Superficial peroneal - Ankle     Lat leg Ankle 3.0 ?4.4 8 ?6 Lat leg - Ankle 14    R Median, Ulnar - Transcarpal comparison     Median Palm Wrist 2.7 ?2.2 15 ?35 Median Palm - Wrist 8       Ulnar Palm Wrist 1.7 ?2.2 14 ?12 Ulnar Palm - Wrist 8          Median Palm - Ulnar Palm  1.0 ?0.4  L Median, Ulnar - Transcarpal comparison     Median Palm Wrist 2.5 ?2.2 41 ?35 Median Palm - Wrist 8       Ulnar Palm Wrist 1.7 ?2.2 11 ?12 Ulnar Palm - Wrist 8          Median Palm - Ulnar Palm  0.8 ?0.4  R Median - Orthodromic (Dig II, Mid palm)     Dig II Wrist 3.6 ?3.4 7 ?10 Dig II - Wrist 13    L Median - Orthodromic (Dig II, Mid palm)     Dig II Wrist 3.1 ?3.4 10 ?10 Dig II - Wrist 13    R Ulnar - Orthodromic, (Dig V, Mid palm)     Dig V Wrist 2.3 ?3.1 8 ?5 Dig V - Wrist 68                     F  Wave    Nerve F Lat Ref.   ms ms  R Tibial - AH 47.4  ?56.0       EMG Summary Table    Spontaneous MUAP Recruitment  Muscle IA Fib PSW Fasc Other Amp Dur. Poly Pattern  R. First dorsal interosseous Normal None None None _______ Normal Normal Normal Normal  R. Abductor pollicis brevis Normal None None None _______ Normal Normal Normal Reduced  R. Pronator teres Normal None None None _______ Normal Normal Normal Normal  R. Biceps brachii Normal None None None _______ Normal Normal Normal Normal  R. Deltoid Normal None None None _______ Normal Normal Normal Normal  R. Brachioradialis Normal None None None _______ Normal Normal Normal Normal

## 2021-02-19 NOTE — Progress Notes (Signed)
ASSESSMENT AND PLAN  Caitlin [name] is a 45 y.o. 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, Treatment for ovarian, fallopian, and primary peritoneal carcinoma stage II, complete chemotherapy February 2021 Right carpal tunnel syndrome  Most likely related to the side effect of chemotherapy treatment  History of bariatric surgery, laboratory evaluation showed vitamin D deficiency  Complaining of side effects with gabapentin, sleepiness, fatigue, currently take it during the nighttime,  EMG nerve conduction study showed mild right carpal tunnel syndrome  will resend Cymbalta 30 mg daily  DIAGNOSTIC DATA (LABS, IMAGING, TESTING) - I reviewed patient records, labs, notes, testing and imaging myself where available.  HISTORICAL  Caitlin [name] is a 45 year old female, accompanied by her husband Caitlin [name], seen in request by her primary care physician from [facility], and orthopedic surgeon Caitlin [name] for evaluation of bilateral hands and feet paresthesia, initial evaluation was on March 29, 2019.  I have reviewed and summarized the referring note from the referring physician, also her oncology evaluation from Caitlin [name], she had a past medical history of obesity, bariatric surgery in 2017, with 60 pounds of weight loss, she was diagnosed with right fallopian tube adenocarcinoma, and primary peritoneal carcinoma, had robotic assistant total laparoscopic hysterectomy, bilateral salpingo-oophorectomy, bilateral pelvic lymphadenectomy, right common iliac lymphadenectomy, peritoneal biopsy, lobectomy in September 2020, this is followed by chemotherapy since October 19, again on November 9 with Paclitaxel, Carboplatin   She used to work as a Research scientist (physical sciences), around May 2020, she noticed intermittent bilateral hands paresthesia, sometimes wake her up from sleep, her bilateral fingertips paresthesia become more obvious 2 days following her first chemotherapy on March 06, 2019, she describes 6 out of 10 constant bilateral fingertip numb tingly achy sensation, radiating to bilateral forearm, she also complains of neck pain, radiating pain to bilateral shoulder, also described bilateral feet, below the knee paresthesia, achy pain  She has taking gabapentin 300 mg every night, which does help her sleep better, but complains of excessive fatigue and drowsiness during the day  She denies significant weakness, mild gait abnormality due to heel pain chest tube insertion site of back surgery more she denies bowel and bladder incontinence  UPDATE December 28 2019: Reviewed note note from her oncologist Caitlin [name], she completed her chemotherapy in February 2021, able to go back to work since April 2021, she complains worsening bilateral hand swelling, achy pain, numbness sensation, constant, intermittent bilateral feet paresthesia, also complains of neck pain, radiating pain to bilateral shoulder, left worse than right,  She has intermittent gait instability contributed to her pain, no bowel and bladder incontinence,  Reviewed laboratory evaluation from 2020, normal or negative CPK, TSH, B12, RPR, iron, copper level, vitamin D, B1, A1c was slight elevated 5.7, vitamin D level was 12.7,  CBC March 2021, anemia hemoglobin of 11.5, CMP was normal with exception of mildly elevated glucose 102  UPDATE Oct 5th 2022: Her bilateral lower extremity symptoms has much improved, but continues to have intermittent bilateral hand swelling, paresthesia, achy pain,  EMG nerve conduction study today showed mild right carpal tunnel syndrome, there was no evidence of large fiber peripheral neuropathy  I personally reviewed MRI of cervical spine August 2021, mild degenerative changes, there was no evidence of significant canal or foraminal narrowing   PHYSICAL EXAM PHYSICAL EXAMNIATION:  Gen: NAD, conversant, well nourised, well groomed         NEUROLOGICAL EXAM:  MENTAL STATUS:  Tired looking middle-aged female Speech/cognition: Awake,  alert, oriented to history taking and casual conversation   CRANIAL NERVES: CN II: Visual fields are full to confrontation.  Pupils are round equal and briskly reactive to light. CN III, IV, VI: extraocular movement are normal. No ptosis. CN V: Facial sensation is intact to pinprick in all 3 divisions bilaterally. Corneal responses are intact.  CN VII: Face is symmetric with normal eye closure and smile. CN VIII: Hearing is normal to causal conversation. CN IX, X: Palate elevates symmetrically. Phonation is normal. CN XI: Head turning and shoulder shrug are intact  MOTOR: There is no pronator drift of out-stretched arms. Muscle bulk and tone are normal. Muscle strength is normal.  REFLEXES: Reflexes are 1 and symmetric at the biceps, triceps, knees, and ankles. Plantar responses are flexor.  SENSORY: Preserved vibratory sensation, mildly decreased light touch pinprick to ankle level  COORDINATION: No truncal ataxia, no limb dysmetria  GAIT/STANCE: Posture is normal.    REVIEW OF SYSTEMS: Full 14 system review of systems performed and notable only for as above All other review of systems were negative.  ALLERGIES: No Known Allergies  HOME MEDICATIONS: Current Outpatient Medications  Medication Sig Dispense Refill   Multiple Vitamin (MULTIVITAMIN) tablet Take 1 tablet by mouth daily.     No current facility-administered medications for this visit.    PAST MEDICAL HISTORY: Past Medical History:  Diagnosis Date   Bacterial vaginitis    Cancer ([place])    Primary peritoneal carcinoma   Depression    Family history of cervical cancer    Family history of ovarian cancer    Family history of throat cancer    Hypercholesterolemia 05/17/2017    PAST SURGICAL HISTORY: Past Surgical History:  Procedure Laterality Date   ABDOMINAL HYSTERECTOMY     DIAGNOSTIC LAPAROSCOPY     DILATION AND CURETTAGE OF UTERUS      GASTRIC BYPASS  2017   HYSTEROSCOPY WITH D & C Bilateral 06/07/2013   Procedure: DILATATION AND CURETTAGE /HYSTEROSCOPY with bilateral tubal cathetrization  ;  Surgeon: Governor Specking, MD;  Location: [facility];  Service: Gynecology;  Laterality: Bilateral;   IR IMAGING GUIDED PORT INSERTION  03/03/2019   IR REMOVAL TUN ACCESS W/ PORT W/O FL MOD SED  07/27/2019   LAPAROSCOPY Left 06/07/2013   Procedure: LAPAROSCOPY DIAGNOSTIC with aspiration of left peritubal cyst  ;  Surgeon: Governor Specking, MD;  Location: [facility];  Service: Gynecology;  Laterality: Left;   LAPAROTOMY Right 06/07/2013   Procedure: LAPAROTOMY with bilateral corneal segmental salpingectomy right corneal anastamosis;  Surgeon: Governor Specking, MD;  Location: [facility];  Service: Gynecology;  Laterality: Right;    FAMILY HISTORY: Family History  Problem Relation Age of Onset   Ovarian cancer Mother 78   Diabetes Paternal Grandmother    Cancer Father        unknown type - possibly skin   HIV Father    Heart Problems Maternal Grandfather    Cancer Paternal Grandfather        unknown type, diagnosed older than 15   Cancer Maternal Uncle        unknown type, diagnosed older than 57   Throat cancer Maternal Uncle        diagnosed older than 93s, smoker   Cervical cancer Cousin        paternal cousin   Breast cancer Neg Hx    Colon cancer Neg Hx     SOCIAL HISTORY: Social History   Socioeconomic History   Marital status: Married  Spouse name: Izora Gala   Number of children: 1   Years of education: college   Highest education level: Not on file  Occupational History   Occupation: receptionist  Tobacco Use   Smoking status: Never   Smokeless tobacco: Never  Vaping Use   Vaping Use: Never used  Substance and Sexual Activity   Alcohol use: No   Drug use: No   Sexual activity: Yes    Birth control/protection: None  Other Topics Concern   Not on file  Social History Narrative   Lives at home with her husband.    Right-handed.   No daily caffeine use.   Social Determinants of Health   Financial Resource Strain: Not on file  Food Insecurity: Not on file  Transportation Needs: Not on file  Physical Activity: Not on file  Stress: Not on file  Social Connections: Not on file  Intimate Partner Violence: Not on file      Marcial Pacas, M.D. Ph.D.  [facility] [address], [place], [place] [address] Ph: [phone] Fax: [phone]  CC: Caitlin Brod, MD [place]

## 2021-04-01 ENCOUNTER — Encounter: Payer: Self-pay | Admitting: Hematology and Oncology

## 2021-04-01 ENCOUNTER — Inpatient Hospital Stay: Payer: 59 | Attending: Hematology and Oncology

## 2021-04-01 ENCOUNTER — Inpatient Hospital Stay: Payer: 59 | Admitting: Hematology and Oncology

## 2021-04-01 ENCOUNTER — Other Ambulatory Visit: Payer: Self-pay

## 2021-04-01 DIAGNOSIS — Z6841 Body Mass Index (BMI) 40.0 and over, adult: Secondary | ICD-10-CM | POA: Insufficient documentation

## 2021-04-01 DIAGNOSIS — Z9221 Personal history of antineoplastic chemotherapy: Secondary | ICD-10-CM | POA: Insufficient documentation

## 2021-04-01 DIAGNOSIS — Z79899 Other long term (current) drug therapy: Secondary | ICD-10-CM | POA: Insufficient documentation

## 2021-04-01 DIAGNOSIS — Z90710 Acquired absence of both cervix and uterus: Secondary | ICD-10-CM | POA: Diagnosis not present

## 2021-04-01 DIAGNOSIS — G620 Drug-induced polyneuropathy: Secondary | ICD-10-CM

## 2021-04-01 DIAGNOSIS — C482 Malignant neoplasm of peritoneum, unspecified: Secondary | ICD-10-CM

## 2021-04-01 DIAGNOSIS — T451X5A Adverse effect of antineoplastic and immunosuppressive drugs, initial encounter: Secondary | ICD-10-CM

## 2021-04-01 DIAGNOSIS — Z90722 Acquired absence of ovaries, bilateral: Secondary | ICD-10-CM | POA: Diagnosis not present

## 2021-04-01 LAB — COMPREHENSIVE METABOLIC PANEL
ALT: 22 U/L (ref 0–44)
AST: 24 U/L (ref 15–41)
Albumin: 3.8 g/dL (ref 3.5–5.0)
Alkaline Phosphatase: 86 U/L (ref 38–126)
Anion gap: 9 (ref 5–15)
BUN: 12 mg/dL (ref 6–20)
CO2: 29 mmol/L (ref 22–32)
Calcium: 9.5 mg/dL (ref 8.9–10.3)
Chloride: 105 mmol/L (ref 98–111)
Creatinine, Ser: 0.85 mg/dL (ref 0.44–1.00)
GFR, Estimated: >60 mL/min (ref >60)
Glucose, Bld: 84 mg/dL (ref 70–99)
Potassium: 4 mmol/L (ref 3.5–5.1)
Sodium: 143 mmol/L (ref 135–145)
Total Bilirubin: 0.3 mg/dL (ref 0.3–1.2)
Total Protein: 8.1 g/dL (ref 6.5–8.1)

## 2021-04-01 LAB — CBC WITH DIFFERENTIAL/PLATELET
Abs Immature Granulocytes: 0.01 10*3/uL (ref 0.00–0.07)
Basophils Absolute: 0 10*3/uL (ref 0.0–0.1)
Basophils Relative: 0 %
Eosinophils Absolute: 0.2 10*3/uL (ref 0.0–0.5)
Eosinophils Relative: 3 %
HCT: 42.4 % (ref 36.0–46.0)
Hemoglobin: 13.5 g/dL (ref 12.0–15.0)
Immature Granulocytes: 0 %
Lymphocytes Relative: 23 %
Lymphs Abs: 1.5 10*3/uL (ref 0.7–4.0)
MCH: 27.9 pg (ref 26.0–34.0)
MCHC: 31.8 g/dL (ref 30.0–36.0)
MCV: 87.6 fL (ref 80.0–100.0)
Monocytes Absolute: 0.5 10*3/uL (ref 0.1–1.0)
Monocytes Relative: 7 %
Neutro Abs: 4.2 10*3/uL (ref 1.7–7.7)
Neutrophils Relative %: 67 %
Platelets: 294 10*3/uL (ref 150–400)
RBC: 4.84 MIL/uL (ref 3.87–5.11)
RDW: 11.8 % (ref 11.5–15.5)
WBC: 6.3 10*3/uL (ref 4.0–10.5)
nRBC: 0 % (ref 0.0–0.2)

## 2021-04-01 NOTE — Assessment & Plan Note (Signed)
She has progressive weight gain, will likely worsen with medications such as Cymbalta We discussed the importance of follow-up, weight loss and dietary change due to association between obesity with cancer recurrence

## 2021-04-01 NOTE — Assessment & Plan Note (Signed)
She has no signs or symptoms to suggest recurrence of cancer She will see Dr. Skeet Latch at [facility] in a few months and I will see her back in the summer The patient is educated to watch out for signs and symptoms of cancer recurrence

## 2021-04-01 NOTE — Progress Notes (Signed)
[place] OFFICE PROGRESS NOTE  Patient Care Team: [place]. 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[facility]) She has no signs or symptoms to suggest recurrence of cancer She will see Dr. Skeet Latch at [facility] in a few months and I will see her back in the summer The patient is educated to watch out for signs and symptoms of cancer recurrence  Peripheral neuropathy due to chemotherapy [facility]) She has multifactorial neuropathy Her peripheral neuropathy in the feet is getting better which is appropriate course of improvement given discontinuation of chemotherapy However, neuropathy in the hands are worse I have reviewed documentation of her recent nerve conduction study I recommend consideration for carpal tunnel surgery so that she would not lose function in her hands  Obesity, Class III, BMI 40-49.9 (morbid obesity) ([place]) She has progressive weight gain, will likely worsen with medications such as Cymbalta We discussed the importance of follow-up, weight loss and dietary change due to association between obesity with cancer recurrence  No orders of the defined types were placed in this encounter.   All questions were answered. The patient knows to call the clinic with any problems, questions or concerns. The total time spent in the appointment was 20 minutes encounter with patients including review of chart and various tests results, discussions about plan of care and coordination of care plan   Heath Lark, MD 04/01/2021 9:37 AM  INTERVAL HISTORY: Please see below for problem oriented charting. she returns for surveillance follow-up for history of primary peritoneal carcinoma status post surgery and chemotherapy She denies abdominal pain, bloating or changes in bowel habits She was recently started on Cymbalta due to progressive neuropathy affecting her hands She  was evaluated by neurologist including nerve conduction studies The neuropathy sensation affecting her feet is improving but worse in her hands She started dropping objects and have difficulties with activities of daily living such as opening bottles  REVIEW OF SYSTEMS:   Constitutional: Denies fevers, chills or abnormal weight loss Eyes: Denies blurriness of vision Ears, nose, mouth, throat, and face: Denies mucositis or sore throat Respiratory: Denies cough, dyspnea or wheezes Cardiovascular: Denies palpitation, chest discomfort or lower extremity swelling Gastrointestinal:  Denies nausea, heartburn or change in bowel habits Skin: Denies abnormal skin rashes Lymphatics: Denies new lymphadenopathy or easy bruising Behavioral/Psych: Mood is stable, no new changes  All other systems were reviewed with the patient and are negative.  I have reviewed the past medical history, past surgical history, social history and family history with the patient and they are unchanged from previous note.  ALLERGIES:  has No Known Allergies.  MEDICATIONS:  Current Outpatient Medications  Medication Sig Dispense Refill   DULoxetine (CYMBALTA) 30 MG capsule Take 1 capsule (30 mg total) by mouth daily. 30 capsule 5   Multiple Vitamin (MULTIVITAMIN) tablet Take 1 tablet by mouth daily.     No current facility-administered medications for this visit.    SUMMARY OF ONCOLOGIC HISTORY: Oncology History Overview Note  Negative genetics Endometrioid   Primary peritoneal adenocarcinoma ([place])  11/24/2018 Imaging   CT chest with IV contrast elsewhere Stable small lung nodules, the largest is in the left lower lobe measuring 6 mm. These are unchanged from November 2017 and are benign in nature. No new nodules.    01/03/2019 Imaging   MRI pelvis Uterus: The uterus measures 5.8 x 9.3 x 5.8 cm. Unremarkable ENDOMETRIUM. Multiple uterine  body intramural fibroids measuring up to 14 mm in size.  Mild endometrial  fluid.   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 - Carcinoma appears to be arising in adnexal soft tissue, considered most consistent with stage pT2 (see comment) - Ovary with physiologic changes and and fallopian tube with paratubal cyst and surface adhesions; no definite parenchymal involvement by carcinoma identified - See synoptic report and comment  B: Lymph nodes, right common iliac, lymphadenectomy - Seven lymph nodes with no metastatic carcinoma identified (0/7)  C: Uterus with cervix and left ovary and fallopian tube, hysterectomy and left salpingo-oophorectomy Cervix: Ectocervix and endocervix with no dysplasia or malignancy identified Endometrium: Endometrium with hemorrhage and weakly proliferative features; no hyperplasia, atypia, or malignancy identified Myometrium: Adenomyosis and benign leiomyomata measuring up to 1.1 cm Ovary, left: Hemorrhagic follicular cyst, small serous inclusions, and physiologic changes Fallopian tube, left: Paratubal cysts and no malignancy identified  D: Lymph nodes, right pelvic, lymphadenectomy - Nine lymph nodes with no metastatic carcinoma identified (0/9)  E: Lymph nodes, left pelvic, lymphadenectomy - Three lymph nodes with no metastatic carcinoma identified (0/3)  F: Peritoneum, left abdominal, biopsy - Fibroadipose tissue with no malignancy identified  G: Peritoneum, right pelvic, biopsy - Fibroadipose tissue with no malignancy identified  H: Peritoneum, right abdominal, biopsy - Fibroadipose tissue with no malignancy identified  I: Peritoneum, left pelvic, biopsy - Fibroadipose tissue with no malignancy identified  J: Peritoneum, posterior cul de sac, biopsy - Fibroadipose tissue with calcifications and no malignancy identified  K: Peritoneum, anterior cul de sac, biopsy - Fibroadipose tissue with no  malignancy identified  L: Omentum, omentectomy - Adipose and fibrovascular tissue consistent with omentum  - No malignancy identified    02/08/2019 Surgery   Preoperative Diagnoses: Adnexal mass, Obesity BMI 40  Postoperative Diagnoses: Right adnexal mass, fallopian tube adenocarcinoma; Obesity BMI 40  Procedures: Diagnostic laparoscopy, Robotic-assisted total laparoscopic hysterectomy, bilateral salpingo-oophorectomy, bilateral pelvic lymphadenectomy, right common iliac lymphadenectomy, peritoneal biopsies, minilaparotomy for infragastric ometectomy  Surgeon: Janie Morning, MD, PhD  Findings: Normal upper abdominal survey: normal liver surface, diaphragm and stomach, normal appearing small and large bowel, normal omentum. No evidence of peritoneal disease or carcinomatosis. Small amount of ascites in the posterior cul-de-sac upon entry. Enlarged right adnexal mass (~8cm), appears to be arising from the fallopian tube with an otherwise normal appearing ovary. There was no intraoperative, intraperitoneal rupture of the mass; controlled removal of the mass occurred extraperitoneally in a bag. Normal appearing uterus and left adnexa. Normal appearing omentum. Patient with a short mesentery precluding adequate visualization of the aorta. Small nodules in the posterior cul-de-sac, biopsied; no additional peritoneal disease. R0 resection.    02/20/2019 Cancer Staging   Staging form: Ovary, Fallopian Tube, and Primary Peritoneal Carcinoma, AJCC 8th Edition - Pathologic: Stage II (pT2, pN0, cM0) - Signed by Heath Lark, MD on 02/20/2019    02/28/2019 Tumor Marker   Patient's tumor was tested for the following markers: CA-125 Results of the tumor marker test revealed 26.8.   03/03/2019 Procedure   Successful placement of a right IJ approach Power Port with ultrasound and fluoroscopic guidance. The catheter is ready for use.   03/06/2019 - 06/23/2019 Chemotherapy   The patient had carboplatin and  taxol for chemotherapy treatment.     03/27/2019 Tumor Marker   Patient's tumor was tested for the following markers: CA-125. Results of the tumor marker test  revealed 8.8   04/17/2019 Tumor Marker   Patient's tumor was tested for the following markers: CA-125 Results of the tumor marker test revealed 8.5   04/25/2019 Genetic Testing   Negative genetic testing:  No pathogenic variants detected on the [facility] TumorNext-HRD+CancerNext panel. Testing did identify two variants of uncertain significance (VUS). One germline VUS was detected in the NBN gene, called c.812T>C (p.V271A). One somatic VUS was detected in the CHEK2 gene, called c.1116_1117delCAinsTG. The report date is 04/25/2019.  The TumorNext-HRD panel offered by [facility] includes analysis of the tumor for somatic variants in the following 11 genes:  ATM, BARD1, BRIP1, CHEK2, MRE11A, NBN, PALB2, RAD51C, RAD51D, BRCA1, BRCA2 (sequencing only). The CancerNext gene panel offered by [employer] includes sequencing and rearrangement analysis of the blood for germline variants in the following 36 genes:   APC, ATM, AXIN2, BARD1, BMPR1A, BRCA1, BRCA2, BRIP1, CDH1, CDK4, CDKN2A, CHEK2, DICER1, HOXB13, EPCAM, GREM1, MLH1, MSH2, MSH3, MSH6, MUTYH, NBN, NF1, NTHL1, PALB2, PMS2, POLD1, POLE, PTEN, RAD51C, RAD51D, RECQL, SMAD4, SMARCA4, STK11, and TP53.      06/23/2019 Tumor Marker   Patient's tumor was tested for the following markers: CA-125 Results of the tumor marker test revealed 7.6   07/20/2019 Imaging   1. Status post interval hysterectomy and bilateral oophorectomy as well as omentectomy. 2. No CT evidence of nodal or distant metastatic disease within the abdomen or pelvis. 3. Hepatic steatosis.   07/20/2019 Tumor Marker   Patient's tumor was tested for the following markers: CA-125 Results of the tumor marker test revealed 6.3.   07/27/2019 Procedure   Removal of implanted Port-A-Cath utilizing sharp and blunt dissection. The procedure  was uncomplicated.   12/21/2019 Imaging   Ct imaging elsewhere No acute intra-abdominal process.      No evidence of metastatic disease within the pelvis.      Two left lower lobe pulmonary indeterminate nodules as above. These were reported to be stable on the prior CT from [facility]. Comparison to the priors is recommended..     01/08/2020 Tumor Marker   Patient's tumor was tested for the following markers: CA-125 Results of the tumor marker test revealed 5.5.   07/11/2020 Tumor Marker   Patient's tumor was tested for the following markers: CA-125 Results of the tumor marker test revealed 4.8.   10/08/2020 Tumor Marker   Patient's tumor was tested for the following markers: CA-125 Results of the tumor marker test revealed 6.1     PHYSICAL EXAMINATION: ECOG PERFORMANCE STATUS: 1 - Symptomatic but completely ambulatory  Vitals:   04/01/21 0853  BP: (!) 143/93  Pulse: 91  Resp: 18  Temp: 97.6 F (36.4 C)  SpO2: 98%   Filed Weights   04/01/21 0853  Weight: 239 lb 12.8 oz (108.8 kg)    GENERAL:alert, no distress and comfortable SKIN: skin color, texture, turgor are normal, no rashes or significant lesions EYES: normal, Conjunctiva are pink and non-injected, sclera clear OROPHARYNX:no exudate, no erythema and lips, buccal mucosa, and tongue normal  NECK: supple, thyroid normal size, non-tender, without nodularity LYMPH:  no palpable lymphadenopathy in the cervical, axillary or inguinal LUNGS: clear to auscultation and percussion with normal breathing effort HEART: regular rate & rhythm and no murmurs and no lower extremity edema ABDOMEN:abdomen soft, non-tender and normal bowel sounds Musculoskeletal:no cyanosis of digits and no clubbing  NEURO: alert & oriented x 3 with fluent speech, no focal motor/sensory deficits  LABORATORY DATA:  I have reviewed the data as listed  Component Value Date/Time   NA 143 04/01/2021 0833   K 4.0 04/01/2021 0833   CL 105  04/01/2021 0833   CO2 29 04/01/2021 0833   GLUCOSE 84 04/01/2021 0833   BUN 12 04/01/2021 0833   CREATININE 0.85 04/01/2021 0833   CREATININE 0.69 07/20/2019 1005   CALCIUM 9.5 04/01/2021 0833   PROT 8.1 04/01/2021 0833   ALBUMIN 3.8 04/01/2021 0833   AST 24 04/01/2021 0833   AST 33 07/20/2019 1005   ALT 22 04/01/2021 0833   ALT 32 07/20/2019 1005   ALKPHOS 86 04/01/2021 0833   BILITOT 0.3 04/01/2021 0833   BILITOT 0.4 07/20/2019 1005   GFRNONAA >60 04/01/2021 0833   GFRNONAA >60 07/20/2019 1005   GFRAA >60 01/08/2020 1007   GFRAA >60 07/20/2019 1005    No results found for: SPEP, UPEP  Lab Results  Component Value Date   WBC 6.3 04/01/2021   NEUTROABS 4.2 04/01/2021   HGB 13.5 04/01/2021   HCT 42.4 04/01/2021   MCV 87.6 04/01/2021   PLT 294 04/01/2021      Chemistry      Component Value Date/Time   NA 143 04/01/2021 0833   K 4.0 04/01/2021 0833   CL 105 04/01/2021 0833   CO2 29 04/01/2021 0833   BUN 12 04/01/2021 0833   CREATININE 0.85 04/01/2021 0833   CREATININE 0.69 07/20/2019 1005      Component Value Date/Time   CALCIUM 9.5 04/01/2021 0833   ALKPHOS 86 04/01/2021 0833   AST 24 04/01/2021 0833   AST 33 07/20/2019 1005   ALT 22 04/01/2021 0833   ALT 32 07/20/2019 1005   BILITOT 0.3 04/01/2021 0833   BILITOT 0.4 07/20/2019 1005

## 2021-04-01 NOTE — Assessment & Plan Note (Signed)
She has multifactorial neuropathy Her peripheral neuropathy in the feet is getting better which is appropriate course of improvement given discontinuation of chemotherapy However, neuropathy in the hands are worse I have reviewed documentation of her recent nerve conduction study I recommend consideration for carpal tunnel surgery so that she would not lose function in her hands

## 2021-04-02 ENCOUNTER — Telehealth: Payer: Self-pay

## 2021-04-02 LAB — CA 125: Cancer Antigen (CA) 125: 5.9 U/mL (ref 0.0–38.1)

## 2021-04-02 NOTE — Telephone Encounter (Signed)
-----   Message from Heath Lark, MD sent at 04/02/2021  8:40 AM EST ----- Pls let her know CA-125 is better

## 2021-04-02 NOTE — Telephone Encounter (Signed)
Called pt to make her aware. Pt verbalized thanks and understanding.

## 2021-04-03 ENCOUNTER — Ambulatory Visit: Payer: 59 | Admitting: Hematology and Oncology

## 2021-04-03 ENCOUNTER — Other Ambulatory Visit: Payer: 59

## 2021-06-03 ENCOUNTER — Encounter: Payer: Self-pay | Admitting: Hematology and Oncology

## 2021-11-11 ENCOUNTER — Other Ambulatory Visit: Payer: Self-pay

## 2021-11-11 ENCOUNTER — Inpatient Hospital Stay: Payer: 59 | Attending: Hematology and Oncology

## 2021-11-11 ENCOUNTER — Encounter: Payer: Self-pay | Admitting: Hematology and Oncology

## 2021-11-11 ENCOUNTER — Inpatient Hospital Stay (HOSPITAL_BASED_OUTPATIENT_CLINIC_OR_DEPARTMENT_OTHER): Payer: 59 | Admitting: Hematology and Oncology

## 2021-11-11 DIAGNOSIS — Z9221 Personal history of antineoplastic chemotherapy: Secondary | ICD-10-CM | POA: Insufficient documentation

## 2021-11-11 DIAGNOSIS — Z90722 Acquired absence of ovaries, bilateral: Secondary | ICD-10-CM | POA: Diagnosis not present

## 2021-11-11 DIAGNOSIS — Z8542 Personal history of malignant neoplasm of other parts of uterus: Secondary | ICD-10-CM | POA: Diagnosis present

## 2021-11-11 DIAGNOSIS — Z9079 Acquired absence of other genital organ(s): Secondary | ICD-10-CM | POA: Insufficient documentation

## 2021-11-11 DIAGNOSIS — Z90710 Acquired absence of both cervix and uterus: Secondary | ICD-10-CM | POA: Insufficient documentation

## 2021-11-11 DIAGNOSIS — Z79899 Other long term (current) drug therapy: Secondary | ICD-10-CM | POA: Insufficient documentation

## 2021-11-11 DIAGNOSIS — C482 Malignant neoplasm of peritoneum, unspecified: Secondary | ICD-10-CM | POA: Diagnosis not present

## 2021-11-11 LAB — COMPREHENSIVE METABOLIC PANEL
ALT: 24 U/L (ref 0–44)
AST: 23 U/L (ref 15–41)
Albumin: 3.8 g/dL (ref 3.5–5.0)
Alkaline Phosphatase: 70 U/L (ref 38–126)
Anion gap: 4 — ABNORMAL LOW (ref 5–15)
BUN: 18 mg/dL (ref 6–20)
CO2: 32 mmol/L (ref 22–32)
Calcium: 9.2 mg/dL (ref 8.9–10.3)
Chloride: 104 mmol/L (ref 98–111)
Creatinine, Ser: 0.79 mg/dL (ref 0.44–1.00)
GFR, Estimated: >60 mL/min (ref >60)
Glucose, Bld: 137 mg/dL — ABNORMAL HIGH (ref 70–99)
Potassium: 4 mmol/L (ref 3.5–5.1)
Sodium: 140 mmol/L (ref 135–145)
Total Bilirubin: 0.5 mg/dL (ref 0.3–1.2)
Total Protein: 6.8 g/dL (ref 6.5–8.1)

## 2021-11-11 LAB — CBC WITH DIFFERENTIAL/PLATELET
Abs Immature Granulocytes: 0.01 10*3/uL (ref 0.00–0.07)
Basophils Absolute: 0 10*3/uL (ref 0.0–0.1)
Basophils Relative: 0 %
Eosinophils Absolute: 0.2 10*3/uL (ref 0.0–0.5)
Eosinophils Relative: 4 %
HCT: 39.7 % (ref 36.0–46.0)
Hemoglobin: 12.8 g/dL (ref 12.0–15.0)
Immature Granulocytes: 0 %
Lymphocytes Relative: 23 %
Lymphs Abs: 1.6 10*3/uL (ref 0.7–4.0)
MCH: 29 pg (ref 26.0–34.0)
MCHC: 32.2 g/dL (ref 30.0–36.0)
MCV: 89.8 fL (ref 80.0–100.0)
Monocytes Absolute: 0.5 10*3/uL (ref 0.1–1.0)
Monocytes Relative: 8 %
Neutro Abs: 4.4 10*3/uL (ref 1.7–7.7)
Neutrophils Relative %: 65 %
Platelets: 220 10*3/uL (ref 150–400)
RBC: 4.42 MIL/uL (ref 3.87–5.11)
RDW: 12 % (ref 11.5–15.5)
WBC: 6.7 10*3/uL (ref 4.0–10.5)
nRBC: 0 % (ref 0.0–0.2)

## 2021-11-11 NOTE — Progress Notes (Signed)
[facility] OFFICE PROGRESS NOTE  Patient Care Team: [facility] 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   All questions were answered. The patient knows to call the clinic with any problems, questions or concerns. The total time spent in the appointment was 20 minutes encounter with patients including review of chart and various tests results, discussions about plan of care and coordination of care plan   Artis Delay, MD 11/11/2021 10:34 AM  INTERVAL HISTORY: Please see below for problem oriented charting. she returns for surveillance follow-up for primary peritoneal cancer She is doing well Denies abdominal pain, nausea or changes in bowel habits Denies abnormal vaginal bleeding  REVIEW OF SYSTEMS:   Constitutional: Denies fevers, chills or abnormal weight loss Eyes: Denies blurriness of vision Ears, nose, mouth, throat, and face: Denies mucositis or sore throat Respiratory: Denies cough, dyspnea or wheezes Cardiovascular: Denies palpitation, chest discomfort or lower extremity swelling Gastrointestinal:  Denies nausea, heartburn or change in bowel habits Skin: Denies abnormal skin rashes Lymphatics: Denies new lymphadenopathy or easy bruising Neurological:Denies numbness, tingling or new weaknesses Behavioral/Psych: Mood is stable, no new changes  All other systems were reviewed with the patient and are negative.  I have reviewed the past medical history, past surgical history, social history and family history with the patient and they are unchanged from previous  note.  ALLERGIES:  has No Known Allergies.  MEDICATIONS:  Current Outpatient Medications  Medication Sig Dispense Refill   DULoxetine (CYMBALTA) 30 MG capsule Take 1 capsule (30 mg total) by mouth daily. 30 capsule 5   Multiple Vitamin (MULTIVITAMIN) tablet Take 1 tablet by mouth daily.     No current facility-administered medications for this visit.    SUMMARY OF ONCOLOGIC HISTORY: Oncology History Overview Note  Negative genetics Endometrioid   Primary peritoneal adenocarcinoma (HCC)  11/24/2018 Imaging   CT chest with IV contrast elsewhere Stable small lung nodules, the largest is in the left lower lobe measuring 6 mm. These are unchanged from November 2017 and are benign in nature. No new nodules.    01/03/2019 Imaging   MRI pelvis Uterus: The uterus measures 5.8 x 9.3 x 5.8 cm. Unremarkable ENDOMETRIUM. Multiple uterine body intramural fibroids measuring up to 14 mm in size.  Mild endometrial fluid.   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 - Carcinoma appears to be arising in adnexal soft tissue, considered most consistent with stage pT2 (see comment) - Ovary with physiologic changes and and fallopian tube with paratubal cyst and surface adhesions; no definite parenchymal involvement by carcinoma identified - See synoptic report and comment  B: Lymph nodes, right common iliac, lymphadenectomy - Seven lymph nodes with no metastatic carcinoma identified (0/7)  C: Uterus with cervix and left ovary and fallopian tube, hysterectomy and left salpingo-oophorectomy Cervix: Ectocervix and endocervix with no dysplasia or malignancy identified Endometrium: Endometrium with hemorrhage and weakly proliferative features; no hyperplasia, atypia, or malignancy identified Myometrium: Adenomyosis and benign leiomyomata measuring up to 1.1 cm Ovary, left: Hemorrhagic  follicular cyst, small serous inclusions, and physiologic changes Fallopian tube, left: Paratubal cysts and no malignancy identified  D: Lymph nodes, right pelvic, lymphadenectomy - Nine lymph nodes with no metastatic carcinoma identified (0/9)  E: Lymph nodes, left pelvic, lymphadenectomy - Three lymph nodes with no metastatic carcinoma identified (0/3)  F: Peritoneum, left abdominal, biopsy - Fibroadipose tissue with no malignancy identified  G: Peritoneum, right pelvic, biopsy - Fibroadipose tissue with no malignancy identified  H: Peritoneum, right abdominal, biopsy - Fibroadipose tissue with no malignancy identified  I: Peritoneum, left pelvic, biopsy - Fibroadipose tissue with no malignancy identified  J: Peritoneum, posterior cul de sac, biopsy - Fibroadipose tissue with calcifications and no malignancy identified  K: Peritoneum, anterior cul de sac, biopsy - Fibroadipose tissue with no malignancy identified  L: Omentum, omentectomy - Adipose and fibrovascular tissue consistent with omentum  - No malignancy identified    02/08/2019 Surgery   Preoperative Diagnoses: Adnexal mass, Obesity BMI 40  Postoperative Diagnoses: Right adnexal mass, fallopian tube adenocarcinoma; Obesity BMI 40  Procedures: Diagnostic laparoscopy, Robotic-assisted total laparoscopic hysterectomy, bilateral salpingo-oophorectomy, bilateral pelvic lymphadenectomy, right common iliac lymphadenectomy, peritoneal biopsies, minilaparotomy for infragastric ometectomy  Surgeon: Laurette Schimke, MD, PhD  Findings: Normal upper abdominal survey: normal liver surface, diaphragm and stomach, normal appearing small and large bowel, normal omentum. No evidence of peritoneal disease or carcinomatosis. Small amount of ascites in the posterior cul-de-sac upon entry. Enlarged right adnexal mass (~8cm), appears to be arising from the fallopian tube with an otherwise normal appearing ovary. There was no intraoperative,  intraperitoneal rupture of the mass; controlled removal of the mass occurred extraperitoneally in a bag. Normal appearing uterus and left adnexa. Normal appearing omentum. Patient with a short mesentery precluding adequate visualization of the aorta. Small nodules in the posterior cul-de-sac, biopsied; no additional peritoneal disease. R0 resection.    02/20/2019 Cancer Staging   Staging form: Ovary, Fallopian Tube, and Primary Peritoneal Carcinoma, AJCC 8th Edition - Pathologic: Stage II (pT2, pN0, cM0) - Signed by Artis Delay, MD on 02/20/2019   02/28/2019 Tumor Marker   Patient's tumor was tested for the following markers: CA-125 Results of the tumor marker test revealed 26.8.   03/03/2019 Procedure   Successful placement of a right IJ approach Power Port with ultrasound and fluoroscopic guidance. The catheter is ready for use.   03/06/2019 - 06/23/2019 Chemotherapy   The patient had carboplatin and taxol for chemotherapy treatment.     03/27/2019 Tumor Marker   Patient's tumor was tested for the following markers: CA-125. Results of the tumor marker test revealed 8.8   04/17/2019 Tumor Marker   Patient's tumor was tested for the following markers: CA-125 Results of the tumor marker test revealed 8.5   04/25/2019 Genetic Testing   Negative genetic testing:  No pathogenic variants detected on the [employer] TumorNext-HRD+CancerNext panel. Testing did identify two variants of uncertain significance (VUS). One germline VUS was detected in the NBN gene, called c.812T>C (p.V271A). One somatic VUS was detected in the CHEK2 gene, called c.1116_1117delCAinsTG. The report date is 04/25/2019.  The TumorNext-HRD panel offered by [facility] [facility] includes analysis of the tumor for somatic variants in the following 11 genes:  ATM, BARD1, BRIP1, CHEK2, MRE11A, NBN, PALB2, RAD51C, RAD51D, BRCA1, BRCA2 (sequencing only). The CancerNext gene panel offered by [employer] includes sequencing and rearrangement  analysis of the blood for germline variants in the following 36 genes:   APC, ATM, AXIN2, BARD1, BMPR1A, BRCA1, BRCA2, BRIP1, CDH1, CDK4, CDKN2A, CHEK2, DICER1,  HOXB13, EPCAM, GREM1, MLH1, MSH2, MSH3, MSH6, MUTYH, NBN, NF1, NTHL1, PALB2, PMS2, POLD1, POLE, PTEN, RAD51C, RAD51D, RECQL, SMAD4, SMARCA4, STK11, and TP53.      06/23/2019 Tumor Marker   Patient's tumor was tested for the following markers: CA-125 Results of the tumor marker test revealed 7.6   07/20/2019 Imaging   1. Status post interval hysterectomy and bilateral oophorectomy as well as omentectomy. 2. No CT evidence of nodal or distant metastatic disease within the abdomen or pelvis. 3. Hepatic steatosis.   07/20/2019 Tumor Marker   Patient's tumor was tested for the following markers: CA-125 Results of the tumor marker test revealed 6.3.   07/27/2019 Procedure   Removal of implanted Port-A-Cath utilizing sharp and blunt dissection. The procedure was uncomplicated.   12/21/2019 Imaging   Ct imaging elsewhere No acute intra-abdominal process.      No evidence of metastatic disease within the pelvis.      Two left lower lobe pulmonary indeterminate nodules as above. These were reported to be stable on the prior CT from [facility]. Comparison to the priors is recommended..     01/08/2020 Tumor Marker   Patient's tumor was tested for the following markers: CA-125 Results of the tumor marker test revealed 5.5.   07/11/2020 Tumor Marker   Patient's tumor was tested for the following markers: CA-125 Results of the tumor marker test revealed 4.8.   10/08/2020 Tumor Marker   Patient's tumor was tested for the following markers: CA-125 Results of the tumor marker test revealed 6.1   04/01/2021 Tumor Marker   Patient's tumor was tested for the following markers: CA-125. Results of the tumor marker test revealed 5.9.     PHYSICAL EXAMINATION: ECOG PERFORMANCE STATUS: 0 - Asymptomatic  Vitals:   11/11/21 0850  BP: 138/88   Pulse: 75  Resp: 18  Temp: (!) 97.5 F (36.4 C)  SpO2: 98%   Filed Weights   11/11/21 0850  Weight: 242 lb 3.2 oz (109.9 kg)    GENERAL:alert, no distress and comfortable SKIN: skin color, texture, turgor are normal, no rashes or significant lesions EYES: normal, Conjunctiva are pink and non-injected, sclera clear OROPHARYNX:no exudate, no erythema and lips, buccal mucosa, and tongue normal  NECK: supple, thyroid normal size, non-tender, without nodularity LYMPH:  no palpable lymphadenopathy in the cervical, axillary or inguinal LUNGS: clear to auscultation and percussion with normal breathing effort HEART: regular rate & rhythm and no murmurs and no lower extremity edema ABDOMEN:abdomen soft, non-tender and normal bowel sounds Musculoskeletal:no cyanosis of digits and no clubbing  NEURO: alert & oriented x 3 with fluent speech, no focal motor/sensory deficits  LABORATORY DATA:  I have reviewed the data as listed    Component Value Date/Time   NA 140 11/11/2021 0832   K 4.0 11/11/2021 0832   CL 104 11/11/2021 0832   CO2 32 11/11/2021 0832   GLUCOSE 137 (H) 11/11/2021 0832   BUN 18 11/11/2021 0832   CREATININE 0.79 11/11/2021 0832   CREATININE 0.69 07/20/2019 1005   CALCIUM 9.2 11/11/2021 0832   PROT 6.8 11/11/2021 0832   ALBUMIN 3.8 11/11/2021 0832   AST 23 11/11/2021 0832   AST 33 07/20/2019 1005   ALT 24 11/11/2021 0832   ALT 32 07/20/2019 1005   ALKPHOS 70 11/11/2021 0832   BILITOT 0.5 11/11/2021 0832   BILITOT 0.4 07/20/2019 1005   GFRNONAA >60 11/11/2021 0832   GFRNONAA >60 07/20/2019 1005   GFRAA >60 01/08/2020 1007   GFRAA >60  07/20/2019 1005    No results found for: "SPEP", "UPEP"  Lab Results  Component Value Date   WBC 6.7 11/11/2021   NEUTROABS 4.4 11/11/2021   HGB 12.8 11/11/2021   HCT 39.7 11/11/2021   MCV 89.8 11/11/2021   PLT 220 11/11/2021      Chemistry      Component Value Date/Time   NA 140 11/11/2021 0832   K 4.0 11/11/2021 0832    CL 104 11/11/2021 0832   CO2 32 11/11/2021 0832   BUN 18 11/11/2021 0832   CREATININE 0.79 11/11/2021 0832   CREATININE 0.69 07/20/2019 1005      Component Value Date/Time   CALCIUM 9.2 11/11/2021 0832   ALKPHOS 70 11/11/2021 0832   AST 23 11/11/2021 0832   AST 33 07/20/2019 1005   ALT 24 11/11/2021 0832   ALT 32 07/20/2019 1005   BILITOT 0.5 11/11/2021 0832   BILITOT 0.4 07/20/2019 1005

## 2021-11-12 LAB — CA 125: Cancer Antigen (CA) 125: 5.1 U/mL (ref 0.0–38.1)

## 2021-11-13 ENCOUNTER — Telehealth: Payer: Self-pay

## 2021-11-13 NOTE — Telephone Encounter (Signed)
Called and given below message. She appreciated the call and verbalized understanding. 

## 2021-11-13 NOTE — Telephone Encounter (Signed)
-----   Message from Heath Lark, MD sent at 11/13/2021  8:45 AM EDT ----- Pls let her know CA-125 is nromal

## 2022-07-01 ENCOUNTER — Ambulatory Visit (HOSPITAL_BASED_OUTPATIENT_CLINIC_OR_DEPARTMENT_OTHER): Payer: 59 | Admitting: Orthopaedic Surgery

## 2022-07-15 ENCOUNTER — Encounter: Payer: Self-pay | Admitting: Orthopaedic Surgery

## 2022-07-15 ENCOUNTER — Ambulatory Visit: Payer: 59 | Admitting: Orthopaedic Surgery

## 2022-07-15 DIAGNOSIS — G5602 Carpal tunnel syndrome, left upper limb: Secondary | ICD-10-CM

## 2022-07-15 DIAGNOSIS — G5601 Carpal tunnel syndrome, right upper limb: Secondary | ICD-10-CM

## 2022-07-15 NOTE — Progress Notes (Signed)
Office Visit Note   Patient: Caitlin [name]           Date of Birth: 1975-12-14           MRN: [id number] Visit Date: 07/15/2022              Requested by: [address] [place],  [place] [address] PCP: [place]: Visit Diagnoses:  1. Right carpal tunnel syndrome   2. Left carpal tunnel syndrome     Plan: Impression is bilateral carpal tunnel syndrome.  Disease process explained and treatment options were reviewed.  She would like to try steroid injections first but she will like to come back for another appointment to have these done.  Will see her back in the office soon.  Follow-Up Instructions: No follow-ups on file.   Orders:  No orders of the defined types were placed in this encounter.  No orders of the defined types were placed in this encounter.     Procedures: No procedures performed   Clinical Data: No additional findings.   Subjective: Chief Complaint  Patient presents with   Right Hand - Pain   Left Hand - Pain    HPI  Caitlin [name] is a 47 year old female comes in for symptoms of numbness burning in both hands with radiation up into the arm and shoulder.  Right greater than left.  Symptoms wake her up at night.  Had nerve conduction studies done at [facility] about 2 years ago which showed bilateral carpal tunnel syndrome that was mild.  Her symptoms have gotten significantly worse.  She is worn nighttime splints without any relief.  Review of Systems  Constitutional: Negative.   HENT: Negative.    Eyes: Negative.   Respiratory: Negative.    Cardiovascular: Negative.   Endocrine: Negative.   Musculoskeletal: Negative.   Neurological: Negative.   Hematological: Negative.   Psychiatric/Behavioral: Negative.    All other systems reviewed and are negative.    Objective: Vital Signs: LMP 01/23/2019 (Within Weeks)   Physical Exam Vitals and nursing note  reviewed.  Constitutional:      Appearance: She is well-developed.  HENT:     Head: Normocephalic and atraumatic.     Nose: Nose normal.  Eyes:     Extraocular Movements: Extraocular movements intact.  Cardiovascular:     Pulses: Normal pulses.  Pulmonary:     Effort: Pulmonary effort is normal.  Abdominal:     Palpations: Abdomen is soft.  Musculoskeletal:     Cervical back: Neck supple.  Skin:    General: Skin is warm.     Capillary Refill: Capillary refill takes less than 2 seconds.  Neurological:     Mental Status: She is alert and oriented to person, place, and time. Mental status is at baseline.  Psychiatric:        Behavior: Behavior normal.        Thought Content: Thought content normal.        Judgment: Judgment normal.     Ortho Exam  Examination of the hands showed no muscle atrophy.  No neurovascular compromise.  Positive carpal tunnel compressive signs.  Specialty Comments:  No specialty comments available.  Imaging: No results found.   PMFS History: Patient Active Problem List   Diagnosis Date Noted   Carpal tunnel syndrome of right wrist 02/19/2021   Obesity, Class III, BMI 40-49.9 (morbid obesity) ([place]) 07/11/2020   GERD (gastroesophageal reflux  disease) 05/30/2019   Genetic testing 04/25/2019   Peripheral neuropathy due to chemotherapy ([place]) 04/17/2019   Paresthesia 03/29/2019   Chemotherapy adverse reaction 03/29/2019   History of bariatric surgery 03/29/2019   Neck pain 03/29/2019   Insomnia disorder 03/14/2019   Family history of ovarian cancer    Family history of throat cancer    Family history of cervical cancer    Goals of care, counseling/discussion 02/23/2019   Primary peritoneal adenocarcinoma ([place]) 02/20/2019   Pain in joint, pelvic region and thigh 01/19/2019   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   Past Medical History:  Diagnosis Date   Bacterial vaginitis    Cancer ([place])    Primary peritoneal carcinoma   Depression     Family history of cervical cancer    Family history of ovarian cancer    Family history of throat cancer    Hypercholesterolemia 05/17/2017    Family History  Problem Relation Age of Onset   Ovarian cancer Mother 66   Diabetes Paternal Grandmother    Cancer Father        unknown type - possibly skin   HIV Father    Heart Problems Maternal Grandfather    Cancer Paternal Grandfather        unknown type, diagnosed older than 25   Cancer Maternal Uncle        unknown type, diagnosed older than 37   Throat cancer Maternal Uncle        diagnosed older than 44s, smoker   Cervical cancer Cousin        paternal cousin   Breast cancer Neg Hx    Colon cancer Neg Hx     Past Surgical History:  Procedure Laterality Date   ABDOMINAL HYSTERECTOMY     DIAGNOSTIC LAPAROSCOPY     DILATION AND CURETTAGE OF UTERUS     GASTRIC BYPASS  2017   HYSTEROSCOPY WITH D & C Bilateral 06/07/2013   Procedure: DILATATION AND CURETTAGE /HYSTEROSCOPY with bilateral tubal cathetrization  ;  Surgeon: Governor Specking, MD;  Location: [facility];  Service: Gynecology;  Laterality: Bilateral;   IR IMAGING GUIDED PORT INSERTION  03/03/2019   IR REMOVAL TUN ACCESS W/ PORT W/O FL MOD SED  07/27/2019   LAPAROSCOPY Left 06/07/2013   Procedure: LAPAROSCOPY DIAGNOSTIC with aspiration of left peritubal cyst  ;  Surgeon: Governor Specking, MD;  Location: [facility];  Service: Gynecology;  Laterality: Left;   LAPAROTOMY Right 06/07/2013   Procedure: LAPAROTOMY with bilateral corneal segmental salpingectomy right corneal anastamosis;  Surgeon: Governor Specking, MD;  Location: [facility];  Service: Gynecology;  Laterality: Right;   Social History   Occupational History   Occupation: receptionist  Tobacco Use   Smoking status: Never   Smokeless tobacco: Never  Vaping Use   Vaping Use: Never used  Substance and Sexual Activity   Alcohol use: No   Drug use: No   Sexual activity: Yes    Birth control/protection: None

## 2022-10-06 ENCOUNTER — Ambulatory Visit: Payer: 59 | Admitting: Orthopaedic Surgery

## 2022-10-22 ENCOUNTER — Ambulatory Visit: Payer: Medicaid Other | Admitting: Orthopaedic Surgery

## 2022-11-12 ENCOUNTER — Encounter: Payer: Self-pay | Admitting: Orthopaedic Surgery

## 2022-11-12 ENCOUNTER — Inpatient Hospital Stay: Payer: Medicaid Other | Attending: Hematology and Oncology

## 2022-11-12 ENCOUNTER — Other Ambulatory Visit (INDEPENDENT_AMBULATORY_CARE_PROVIDER_SITE_OTHER): Payer: Medicaid Other

## 2022-11-12 ENCOUNTER — Encounter: Payer: Self-pay | Admitting: Hematology and Oncology

## 2022-11-12 ENCOUNTER — Inpatient Hospital Stay (HOSPITAL_BASED_OUTPATIENT_CLINIC_OR_DEPARTMENT_OTHER): Payer: Medicaid Other | Admitting: Hematology and Oncology

## 2022-11-12 ENCOUNTER — Other Ambulatory Visit: Payer: Self-pay

## 2022-11-12 ENCOUNTER — Ambulatory Visit: Payer: Medicaid Other | Admitting: Orthopaedic Surgery

## 2022-11-12 VITALS — BP 131/69 | HR 74 | Temp 98.4°F | Resp 18 | Ht 62.0 in | Wt 241.2 lb

## 2022-11-12 DIAGNOSIS — Z90722 Acquired absence of ovaries, bilateral: Secondary | ICD-10-CM | POA: Insufficient documentation

## 2022-11-12 DIAGNOSIS — Z8589 Personal history of malignant neoplasm of other organs and systems: Secondary | ICD-10-CM | POA: Insufficient documentation

## 2022-11-12 DIAGNOSIS — C482 Malignant neoplasm of peritoneum, unspecified: Secondary | ICD-10-CM

## 2022-11-12 DIAGNOSIS — G8929 Other chronic pain: Secondary | ICD-10-CM | POA: Diagnosis not present

## 2022-11-12 DIAGNOSIS — G5601 Carpal tunnel syndrome, right upper limb: Secondary | ICD-10-CM

## 2022-11-12 DIAGNOSIS — Z9079 Acquired absence of other genital organ(s): Secondary | ICD-10-CM | POA: Diagnosis not present

## 2022-11-12 DIAGNOSIS — Z90710 Acquired absence of both cervix and uterus: Secondary | ICD-10-CM | POA: Diagnosis not present

## 2022-11-12 DIAGNOSIS — M25512 Pain in left shoulder: Secondary | ICD-10-CM | POA: Diagnosis not present

## 2022-11-12 LAB — CBC WITH DIFFERENTIAL/PLATELET
Abs Immature Granulocytes: 0.01 10*3/uL (ref 0.00–0.07)
Basophils Absolute: 0 10*3/uL (ref 0.0–0.1)
Basophils Relative: 1 %
Eosinophils Absolute: 0.2 10*3/uL (ref 0.0–0.5)
Eosinophils Relative: 4 %
HCT: 40.3 % (ref 36.0–46.0)
Hemoglobin: 12.9 g/dL (ref 12.0–15.0)
Immature Granulocytes: 0 %
Lymphocytes Relative: 21 %
Lymphs Abs: 1.3 10*3/uL (ref 0.7–4.0)
MCH: 29.4 pg (ref 26.0–34.0)
MCHC: 32 g/dL (ref 30.0–36.0)
MCV: 91.8 fL (ref 80.0–100.0)
Monocytes Absolute: 0.6 10*3/uL (ref 0.1–1.0)
Monocytes Relative: 9 %
Neutro Abs: 4.2 10*3/uL (ref 1.7–7.7)
Neutrophils Relative %: 65 %
Platelets: 233 10*3/uL (ref 150–400)
RBC: 4.39 MIL/uL (ref 3.87–5.11)
RDW: 11.8 % (ref 11.5–15.5)
WBC: 6.3 10*3/uL (ref 4.0–10.5)
nRBC: 0 % (ref 0.0–0.2)

## 2022-11-12 LAB — COMPREHENSIVE METABOLIC PANEL
ALT: 23 U/L (ref 0–44)
AST: 24 U/L (ref 15–41)
Albumin: 3.7 g/dL (ref 3.5–5.0)
Alkaline Phosphatase: 58 U/L (ref 38–126)
Anion gap: 4 — ABNORMAL LOW (ref 5–15)
BUN: 21 mg/dL — ABNORMAL HIGH (ref 6–20)
CO2: 32 mmol/L (ref 22–32)
Calcium: 9.2 mg/dL (ref 8.9–10.3)
Chloride: 104 mmol/L (ref 98–111)
Creatinine, Ser: 0.7 mg/dL (ref 0.44–1.00)
GFR, Estimated: >60 mL/min (ref >60)
Glucose, Bld: 110 mg/dL — ABNORMAL HIGH (ref 70–99)
Potassium: 4.2 mmol/L (ref 3.5–5.1)
Sodium: 140 mmol/L (ref 135–145)
Total Bilirubin: 0.5 mg/dL (ref 0.3–1.2)
Total Protein: 7.3 g/dL (ref 6.5–8.1)

## 2022-11-12 MED ORDER — METHOCARBAMOL 750 MG PO TABS: 750.0000 mg | ORAL_TABLET | Freq: Two times a day (BID) | ORAL | 2 refills | Status: DC | PRN

## 2022-11-12 MED ORDER — PREDNISONE 10 MG (21) PO TBPK: ORAL_TABLET | ORAL | 0 refills | Status: DC

## 2022-11-12 NOTE — Assessment & Plan Note (Signed)
She has no signs or symptoms to suggest recurrence of cancer She has not seen GYN surgeon for over a year The patient is almost 4 years out from her original surgery I will see her once a year I will arrange for her to get GYN exam here The patient is educated to watch out for signs and symptoms of cancer recurrence

## 2022-11-12 NOTE — Progress Notes (Signed)
[facility] OFFICE PROGRESS NOTE  Patient Care Team: [facility] 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   All questions were answered. The patient knows to call the clinic with any problems, questions or concerns. The total time spent in the appointment was 20 minutes encounter with patients including review of chart and various tests results, discussions about plan of care and coordination of care plan   Artis Delay, MD 11/12/2022 8:52 AM  INTERVAL HISTORY: Please see below for problem oriented charting. she returns for surveillance follow-up She is doing well Denies pelvic pain, abnormal discharge or bleeding She felt healthy No abdominal bloating or changes in bowel habits  REVIEW OF SYSTEMS:   Constitutional: Denies fevers, chills or abnormal weight loss Eyes: Denies blurriness of vision Ears, nose, mouth, throat, and face: Denies mucositis or sore throat Respiratory: Denies cough, dyspnea or wheezes Cardiovascular: Denies palpitation, chest discomfort or lower extremity swelling Gastrointestinal:  Denies nausea, heartburn or change in bowel habits Skin: Denies abnormal skin rashes Lymphatics: Denies new lymphadenopathy or easy bruising Neurological:Denies numbness, tingling or new weaknesses Behavioral/Psych: Mood is stable, no new changes  All other systems were reviewed with the patient and are negative.  I have reviewed the past medical history, past surgical  history, social history and family history with the patient and they are unchanged from previous note.  ALLERGIES:  has No Known Allergies.  MEDICATIONS:  Current Outpatient Medications  Medication Sig Dispense Refill   Multiple Vitamin (MULTIVITAMIN) tablet Take 1 tablet by mouth daily.     olmesartan-hydrochlorothiazide (BENICAR HCT) 20-12.5 MG tablet Take 1 tablet by mouth every morning.     No current facility-administered medications for this visit.    SUMMARY OF ONCOLOGIC HISTORY: Oncology History Overview Note  Negative genetics Endometrioid   Primary peritoneal adenocarcinoma (HCC)  11/24/2018 Imaging   CT chest with IV contrast elsewhere Stable small lung nodules, the largest is in the left lower lobe measuring 6 mm. These are unchanged from November 2017 and are benign in nature. No new nodules.    01/03/2019 Imaging   MRI pelvis Uterus: The uterus measures 5.8 x 9.3 x 5.8 cm. Unremarkable ENDOMETRIUM. Multiple uterine body intramural fibroids measuring up to 14 mm in size.  Mild endometrial fluid.   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 - Carcinoma appears to be arising in adnexal soft tissue, considered most consistent with stage pT2 (see comment) - Ovary with physiologic changes and and fallopian tube with paratubal cyst and surface adhesions; no definite parenchymal involvement by carcinoma identified - See synoptic report and comment  B: Lymph nodes, right common iliac, lymphadenectomy - Seven lymph nodes with no metastatic carcinoma identified (0/7)  C: Uterus with cervix and left ovary and fallopian tube, hysterectomy and left salpingo-oophorectomy Cervix: Ectocervix and endocervix with no dysplasia or malignancy identified Endometrium: Endometrium with hemorrhage and  weakly proliferative features; no hyperplasia, atypia, or malignancy  identified Myometrium: Adenomyosis and benign leiomyomata measuring up to 1.1 cm Ovary, left: Hemorrhagic follicular cyst, small serous inclusions, and physiologic changes Fallopian tube, left: Paratubal cysts and no malignancy identified  D: Lymph nodes, right pelvic, lymphadenectomy - Nine lymph nodes with no metastatic carcinoma identified (0/9)  E: Lymph nodes, left pelvic, lymphadenectomy - Three lymph nodes with no metastatic carcinoma identified (0/3)  F: Peritoneum, left abdominal, biopsy - Fibroadipose tissue with no malignancy identified  G: Peritoneum, right pelvic, biopsy - Fibroadipose tissue with no malignancy identified  H: Peritoneum, right abdominal, biopsy - Fibroadipose tissue with no malignancy identified  I: Peritoneum, left pelvic, biopsy - Fibroadipose tissue with no malignancy identified  J: Peritoneum, posterior cul de sac, biopsy - Fibroadipose tissue with calcifications and no malignancy identified  K: Peritoneum, anterior cul de sac, biopsy - Fibroadipose tissue with no malignancy identified  L: Omentum, omentectomy - Adipose and fibrovascular tissue consistent with omentum  - No malignancy identified    02/08/2019 Surgery   Preoperative Diagnoses: Adnexal mass, Obesity BMI 40  Postoperative Diagnoses: Right adnexal mass, fallopian tube adenocarcinoma; Obesity BMI 40  Procedures: Diagnostic laparoscopy, Robotic-assisted total laparoscopic hysterectomy, bilateral salpingo-oophorectomy, bilateral pelvic lymphadenectomy, right common iliac lymphadenectomy, peritoneal biopsies, minilaparotomy for infragastric ometectomy  Surgeon: Laurette Schimke, MD, PhD  Findings: Normal upper abdominal survey: normal liver surface, diaphragm and stomach, normal appearing small and large bowel, normal omentum. No evidence of peritoneal disease or carcinomatosis. Small amount of ascites in the posterior cul-de-sac upon entry. Enlarged right adnexal mass (~8cm), appears  to be arising from the fallopian tube with an otherwise normal appearing ovary. There was no intraoperative, intraperitoneal rupture of the mass; controlled removal of the mass occurred extraperitoneally in a bag. Normal appearing uterus and left adnexa. Normal appearing omentum. Patient with a short mesentery precluding adequate visualization of the aorta. Small nodules in the posterior cul-de-sac, biopsied; no additional peritoneal disease. R0 resection.    02/20/2019 Cancer Staging   Staging form: Ovary, Fallopian Tube, and Primary Peritoneal Carcinoma, AJCC 8th Edition - Pathologic: Stage II (pT2, pN0, cM0) - Signed by Artis Delay, MD on 02/20/2019   02/28/2019 Tumor Marker   Patient's tumor was tested for the following markers: CA-125 Results of the tumor marker test revealed 26.8.   03/03/2019 Procedure   Successful placement of a right IJ approach Power Port with ultrasound and fluoroscopic guidance. The catheter is ready for use.   03/06/2019 - 06/23/2019 Chemotherapy   The patient had carboplatin and taxol for chemotherapy treatment.     03/27/2019 Tumor Marker   Patient's tumor was tested for the following markers: CA-125. Results of the tumor marker test revealed 8.8   04/17/2019 Tumor Marker   Patient's tumor was tested for the following markers: CA-125 Results of the tumor marker test revealed 8.5   04/25/2019 Genetic Testing   Negative genetic testing:  No pathogenic variants detected on the [facility] TumorNext-HRD+CancerNext panel. Testing did identify two variants of uncertain significance (VUS). One germline VUS was detected in the NBN gene, called c.812T>C (p.V271A). One somatic VUS was detected in the CHEK2 gene, called c.1116_1117delCAinsTG. The report date is 04/25/2019.  The TumorNext-HRD panel offered by [facility] includes analysis of the tumor for somatic variants in the following 11 genes:  ATM, BARD1, BRIP1, CHEK2, MRE11A, NBN, PALB2, RAD51C, RAD51D, BRCA1, BRCA2  (sequencing only). The CancerNext gene panel offered by [facility] includes sequencing and rearrangement analysis of the blood  for germline variants in the following 36 genes:   APC, ATM, AXIN2, BARD1, BMPR1A, BRCA1, BRCA2, BRIP1, CDH1, CDK4, CDKN2A, CHEK2, DICER1, HOXB13, EPCAM, GREM1, MLH1, MSH2, MSH3, MSH6, MUTYH, NBN, NF1, NTHL1, PALB2, PMS2, POLD1, POLE, PTEN, RAD51C, RAD51D, RECQL, SMAD4, SMARCA4, STK11, and TP53.      06/23/2019 Tumor Marker   Patient's tumor was tested for the following markers: CA-125 Results of the tumor marker test revealed 7.6   07/20/2019 Imaging   1. Status post interval hysterectomy and bilateral oophorectomy as well as omentectomy. 2. No CT evidence of nodal or distant metastatic disease within the abdomen or pelvis. 3. Hepatic steatosis.   07/20/2019 Tumor Marker   Patient's tumor was tested for the following markers: CA-125 Results of the tumor marker test revealed 6.3.   07/27/2019 Procedure   Removal of implanted Port-A-Cath utilizing sharp and blunt dissection. The procedure was uncomplicated.   12/21/2019 Imaging   Ct imaging elsewhere No acute intra-abdominal process.      No evidence of metastatic disease within the pelvis.      Two left lower lobe pulmonary indeterminate nodules as above. These were reported to be stable on the prior CT from [facility]. Comparison to the priors is recommended..     01/08/2020 Tumor Marker   Patient's tumor was tested for the following markers: CA-125 Results of the tumor marker test revealed 5.5.   07/11/2020 Tumor Marker   Patient's tumor was tested for the following markers: CA-125 Results of the tumor marker test revealed 4.8.   10/08/2020 Tumor Marker   Patient's tumor was tested for the following markers: CA-125 Results of the tumor marker test revealed 6.1   04/01/2021 Tumor Marker   Patient's tumor was tested for the following markers: CA-125. Results of the tumor marker test revealed 5.9.      PHYSICAL EXAMINATION: ECOG PERFORMANCE STATUS: 0 - Asymptomatic  Vitals:   11/12/22 0816  BP: 131/69  Pulse: 74  Resp: 18  Temp: 98.4 F (36.9 C)  SpO2: 98%   Filed Weights   11/12/22 0816  Weight: 241 lb 3.2 oz (109.4 kg)    GENERAL:alert, no distress and comfortable SKIN: skin color, texture, turgor are normal, no rashes or significant lesions EYES: normal, Conjunctiva are pink and non-injected, sclera clear OROPHARYNX:no exudate, no erythema and lips, buccal mucosa, and tongue normal  NECK: supple, thyroid normal size, non-tender, without nodularity LYMPH:  no palpable lymphadenopathy in the cervical, axillary or inguinal LUNGS: clear to auscultation and percussion with normal breathing effort HEART: regular rate & rhythm and no murmurs and no lower extremity edema ABDOMEN:abdomen soft, non-tender and normal bowel sounds Musculoskeletal:no cyanosis of digits and no clubbing  NEURO: alert & oriented x 3 with fluent speech, no focal motor/sensory deficits  LABORATORY DATA:  I have reviewed the data as listed    Component Value Date/Time   NA 140 11/12/2022 0752   K 4.2 11/12/2022 0752   CL 104 11/12/2022 0752   CO2 32 11/12/2022 0752   GLUCOSE 110 (H) 11/12/2022 0752   BUN 21 (H) 11/12/2022 0752   CREATININE 0.70 11/12/2022 0752   CREATININE 0.69 07/20/2019 1005   CALCIUM 9.2 11/12/2022 0752   PROT 7.3 11/12/2022 0752   ALBUMIN 3.7 11/12/2022 0752   AST 24 11/12/2022 0752   AST 33 07/20/2019 1005   ALT 23 11/12/2022 0752   ALT 32 07/20/2019 1005   ALKPHOS 58 11/12/2022 0752   BILITOT 0.5 11/12/2022 0752   BILITOT 0.4 07/20/2019  1005   GFRNONAA >60 11/12/2022 0752   GFRNONAA >60 07/20/2019 1005   GFRAA >60 01/08/2020 1007   GFRAA >60 07/20/2019 1005    No results found for: "SPEP", "UPEP"  Lab Results  Component Value Date   WBC 6.3 11/12/2022   NEUTROABS 4.2 11/12/2022   HGB 12.9 11/12/2022   HCT 40.3 11/12/2022   MCV 91.8 11/12/2022   PLT 233  11/12/2022      Chemistry      Component Value Date/Time   NA 140 11/12/2022 0752   K 4.2 11/12/2022 0752   CL 104 11/12/2022 0752   CO2 32 11/12/2022 0752   BUN 21 (H) 11/12/2022 0752   CREATININE 0.70 11/12/2022 0752   CREATININE 0.69 07/20/2019 1005      Component Value Date/Time   CALCIUM 9.2 11/12/2022 0752   ALKPHOS 58 11/12/2022 0752   AST 24 11/12/2022 0752   AST 33 07/20/2019 1005   ALT 23 11/12/2022 0752   ALT 32 07/20/2019 1005   BILITOT 0.5 11/12/2022 0752   BILITOT 0.4 07/20/2019 1005

## 2022-11-12 NOTE — Progress Notes (Signed)
Office Visit Note   Patient: Caitlin [name]           Date of Birth: 1975-06-15           MRN: [id number] Visit Date: 11/12/2022              Requested by: [facility] [address] [place],  [place] [address] PCP: [facility]   Assessment & Plan: Visit Diagnoses:  1. Right carpal tunnel syndrome   2. Chronic left shoulder pain     Plan: Impression is right carpal tunnel syndrome and cervicalgia.  In regards to the carpal tunnel syndrome, we have discussed proceeding with carpal tunnel cortisone injection today for which she is agreeable to.  In regards to the neck pain, we have discussed starting on a steroid and muscle relaxer and sending her to physical therapy.  She is agreeable to this plan.  Referrals made.  Follow-up as needed.  Follow-Up Instructions: Return if symptoms worsen or fail to improve.   Orders:  Orders Placed This Encounter  Procedures   XR Shoulder Left   XR Cervical Spine 2 or 3 views   Ambulatory referral to Physical Therapy   Meds ordered this encounter  Medications   predniSONE (STERAPRED UNI-PAK 21 TAB) 10 MG (21) TBPK tablet    Sig: Take as directed    Dispense:  21 tablet    Refill:  0   methocarbamol (ROBAXIN-750) 750 MG tablet    Sig: Take 1 tablet (750 mg total) by mouth 2 (two) times daily as needed for muscle spasms.    Dispense:  20 tablet    Refill:  2      Procedures: No procedures performed   Clinical Data: No additional findings.   Subjective: Chief Complaint  Patient presents with   Right Wrist - Pain   Left Shoulder - Pain    HPI patient is a pleasant 47 year old female who comes in today with left shoulder and right hand pain.  Regards to the left shoulder, the pain she has is to the lateral neck and radiates to the top of her shoulder.  Symptoms have been ongoing for the past month.  No injury or change in activity.  She notes a crunching sensation when rotating her neck.  She is  also complaining of occasional weakness to the left upper extremity.  Symptoms are worse when she is carrying heavy bags such as her purse.  She has been taking Tylenol and Advil with mild relief.  No previous cortisone injection to her neck.  In regards to her right hand, she does have a history of bilateral carpal tunnel syndrome right greater than left.  She has had with downgoing nerve conduction study years ago which showed mild carpal tunnel syndrome.  No previous cortisone injection to either hand.  She notes she has paresthesias to both hands and she is started dropping things on the right.  Review of Systems as detailed in HPI.  All others reviewed and are negative.   Vital Signs: LMP 01/23/2019 (Within Weeks)   Physical Exam well-developed well-nourished female no acute distress.  Alert and oriented x 3.  Ortho Exam left shoulder exam is unremarkable.  Cervical spine exam: No spinous tenderness.  She does have left-sided paraspinous musculature tenderness.  Increased pain with rotation to the left.  No focal weakness.  She is neurovascularly intact distally.  Right hand exam: Positive Phalen and Tinel at the wrist.  No thenar atrophy.  She is neurovascularly intact distally.  Specialty Comments:  No specialty comments available.  Imaging: XR Cervical Spine 2 or 3 views  Result Date: 11/12/2022 Moderate degenerative changes C7-T1 with straightening of the spine  XR Shoulder Left  Result Date: 11/12/2022 No acute or structural abnormalities    PMFS History: Patient Active Problem List   Diagnosis Date Noted   Carpal tunnel syndrome of right wrist 02/19/2021   Obesity, Class III, BMI 40-49.9 (morbid obesity) (HCC) 07/11/2020   GERD (gastroesophageal reflux disease) 05/30/2019   Genetic testing 04/25/2019   Peripheral neuropathy due to chemotherapy (HCC) 04/17/2019   Paresthesia 03/29/2019   Chemotherapy adverse reaction 03/29/2019   History of bariatric surgery 03/29/2019    Neck pain 03/29/2019   Insomnia disorder 03/14/2019   Family history of ovarian cancer    Family history of throat cancer    Family history of cervical cancer    Goals of care, counseling/discussion 02/23/2019   Primary peritoneal adenocarcinoma (HCC) 02/20/2019   Pain in joint, pelvic region and thigh 01/19/2019   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   Past Medical History:  Diagnosis Date   Bacterial vaginitis    Cancer (HCC)    Primary peritoneal carcinoma   Depression    Family history of cervical cancer    Family history of ovarian cancer    Family history of throat cancer    Hypercholesterolemia 05/17/2017    Family History  Problem Relation Age of Onset   Ovarian cancer Mother 61   Diabetes Paternal Grandmother    Cancer Father        unknown type - possibly skin   HIV Father    Heart Problems Maternal Grandfather    Cancer Paternal Grandfather        unknown type, diagnosed older than 50   Cancer Maternal Uncle        unknown type, diagnosed older than 50   Throat cancer Maternal Uncle        diagnosed older than 12s, smoker   Cervical cancer Cousin        paternal cousin   Breast cancer Neg Hx    Colon cancer Neg Hx     Past Surgical History:  Procedure Laterality Date   ABDOMINAL HYSTERECTOMY     DIAGNOSTIC LAPAROSCOPY     DILATION AND CURETTAGE OF UTERUS     GASTRIC BYPASS  2017   HYSTEROSCOPY WITH D & C Bilateral 06/07/2013   Procedure: DILATATION AND CURETTAGE /HYSTEROSCOPY with bilateral tubal cathetrization  ;  Surgeon: Fermin Schwab, MD;  Location: [facility];  Service: Gynecology;  Laterality: Bilateral;   IR IMAGING GUIDED PORT INSERTION  03/03/2019   IR REMOVAL TUN ACCESS W/ PORT W/O FL MOD SED  07/27/2019   LAPAROSCOPY Left 06/07/2013   Procedure: LAPAROSCOPY DIAGNOSTIC with aspiration of left peritubal cyst  ;  Surgeon: Fermin Schwab, MD;  Location: [facility];  Service: Gynecology;  Laterality: Left;   LAPAROTOMY Right 06/07/2013   Procedure:  LAPAROTOMY with bilateral corneal segmental salpingectomy right corneal anastamosis;  Surgeon: Fermin Schwab, MD;  Location: [facility];  Service: Gynecology;  Laterality: Right;   Social History   Occupational History   Occupation: receptionist  Tobacco Use   Smoking status: Never   Smokeless tobacco: Never  Vaping Use   Vaping Use: Never used  Substance and Sexual Activity   Alcohol use: No   Drug use: No   Sexual activity: Yes    Birth control/protection: None

## 2022-11-13 ENCOUNTER — Telehealth: Payer: Self-pay

## 2022-11-13 ENCOUNTER — Telehealth: Payer: Self-pay | Admitting: Hematology and Oncology

## 2022-11-13 LAB — CA 125: Cancer Antigen (CA) 125: 4.6 U/mL (ref 0.0–38.1)

## 2022-11-13 NOTE — Telephone Encounter (Signed)
Spoke with patient and scheduled 30 minute established visit for 12/09/2022 @ 9:30am.  Patient confirmed and understood.  Explained to patient of our location and how to get checked in when she gets here.

## 2022-11-13 NOTE — Telephone Encounter (Signed)
Spoke with patient confirming upcoming appointment  

## 2022-12-03 ENCOUNTER — Encounter: Payer: Self-pay | Admitting: Obstetrics & Gynecology

## 2022-12-09 ENCOUNTER — Inpatient Hospital Stay: Payer: Medicaid Other | Attending: Hematology and Oncology | Admitting: Obstetrics & Gynecology

## 2022-12-09 ENCOUNTER — Encounter: Payer: Self-pay | Admitting: Obstetrics & Gynecology

## 2022-12-09 ENCOUNTER — Other Ambulatory Visit: Payer: Self-pay

## 2022-12-09 ENCOUNTER — Ambulatory Visit: Payer: Medicaid Other | Attending: Physician Assistant

## 2022-12-09 VITALS — BP 120/74 | HR 100 | Temp 98.4°F | Resp 20 | Ht 62.0 in | Wt 239.0 lb

## 2022-12-09 DIAGNOSIS — Z8589 Personal history of malignant neoplasm of other organs and systems: Secondary | ICD-10-CM | POA: Diagnosis present

## 2022-12-09 DIAGNOSIS — G8929 Other chronic pain: Secondary | ICD-10-CM

## 2022-12-09 DIAGNOSIS — M6281 Muscle weakness (generalized): Secondary | ICD-10-CM

## 2022-12-09 DIAGNOSIS — M542 Cervicalgia: Secondary | ICD-10-CM

## 2022-12-09 DIAGNOSIS — C482 Malignant neoplasm of peritoneum, unspecified: Secondary | ICD-10-CM

## 2022-12-09 DIAGNOSIS — M25512 Pain in left shoulder: Secondary | ICD-10-CM | POA: Diagnosis present

## 2022-12-09 NOTE — Therapy (Addendum)
OUTPATIENT PHYSICAL THERAPY TREATMENT NOTE/DISCHARGE  PHYSICAL THERAPY DISCHARGE SUMMARY  Visits from Start of Care: 1  Current functional level related to goals / functional outcomes: See goals/objective   Remaining deficits: Unable to assess   Education / Equipment: HEP   Patient agrees to discharge. Patient goals were unable to assess. Patient is being discharged due to not returning since the last visit.     Patient Name: Caitlin [name] MRN: [id number] DOB:04/19/1976, 47 y.o., female Today's Date: 12/09/2022  END OF SESSION:  PT End of Session - 12/09/22 1221     Visit Number 1    Number of Visits 17    Date for PT Re-Evaluation 02/03/23    Authorization Type [employer]    PT Start Time 1048    PT Stop Time 1122    PT Time Calculation (min) 34 min    Activity Tolerance Patient tolerated treatment well    Behavior During Therapy WFL for tasks assessed/performed             Past Medical History:  Diagnosis Date   Bacterial vaginitis    Cancer (HCC)    Primary peritoneal carcinoma   Depression    Family history of cervical cancer    Family history of ovarian cancer    Family history of throat cancer    Hypercholesterolemia 05/17/2017   Past Surgical History:  Procedure Laterality Date   ABDOMINAL HYSTERECTOMY     DIAGNOSTIC LAPAROSCOPY     DILATION AND CURETTAGE OF UTERUS     GASTRIC BYPASS  2017   HYSTEROSCOPY WITH D & C Bilateral 06/07/2013   Procedure: DILATATION AND CURETTAGE /HYSTEROSCOPY with bilateral tubal cathetrization  ;  Surgeon: Fermin Schwab, MD;  Location: [facility];  Service: Gynecology;  Laterality: Bilateral;   IR IMAGING GUIDED PORT INSERTION  03/03/2019   IR REMOVAL TUN ACCESS W/ PORT W/O FL MOD SED  07/27/2019   LAPAROSCOPY Left 06/07/2013   Procedure: LAPAROSCOPY DIAGNOSTIC with aspiration of left peritubal cyst  ;  Surgeon: Fermin Schwab, MD;  Location: [facility];  Service: Gynecology;  Laterality: Left;   LAPAROTOMY Right  06/07/2013   Procedure: LAPAROTOMY with bilateral corneal segmental salpingectomy right corneal anastamosis;  Surgeon: Fermin Schwab, MD;  Location: [facility];  Service: Gynecology;  Laterality: Right;   Patient Active Problem List   Diagnosis Date Noted   Carpal tunnel syndrome of right wrist 02/19/2021   Obesity, Class III, BMI 40-49.9 (morbid obesity) (HCC) 07/11/2020   GERD (gastroesophageal reflux disease) 05/30/2019   Genetic testing 04/25/2019   Peripheral neuropathy due to chemotherapy (HCC) 04/17/2019   Paresthesia 03/29/2019   Chemotherapy adverse reaction 03/29/2019   History of bariatric surgery 03/29/2019   Neck pain 03/29/2019   Insomnia disorder 03/14/2019   Family history of ovarian cancer    Family history of throat cancer    Family history of cervical cancer    Goals of care, counseling/discussion 02/23/2019   Primary peritoneal adenocarcinoma (HCC) 02/20/2019   Pain in joint, pelvic region and thigh 01/19/2019   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  REFERRING PROVIDER: Cristie Hem, PA-C  REFERRING DIAG: [phone] (ICD-10-CM) - Chronic left shoulder pain   THERAPY DIAG:  Cervicalgia - Plan: PT plan of care cert/re-cert  Chronic left shoulder pain - Plan: PT plan of care cert/re-cert  Muscle weakness (generalized) - Plan: PT plan of care cert/re-cert  Rationale for Evaluation and Treatment: Rehabilitation  ONSET DATE: Chronic  SUBJECTIVE:                                                                                                                                                                                                         SUBJECTIVE STATEMENT: Pt presents to PT with reports of chronic neck and L shoulder pain. Has occasional N/T in hands, this is due to carpal tunnel however. Does also note some pain referral past L AC joint, this does not cross the elbow. Driving and prolonged posture does  increase pain.   Hand dominance: Right  PERTINENT HISTORY:  Cancer, Depression  PAIN:  Are you having pain?  Yes: NPRS scale: 5/10 Worst: 8/10 Pain location: left side of neck, L shoulder Pain description: sharp Aggravating factors: driving, work Relieving factors: medication, massage  PRECAUTIONS: None  RED FLAGS: None    WEIGHT BEARING RESTRICTIONS: No  FALLS:  Has patient fallen in last 6 months? No  LIVING ENVIRONMENT: Lives with: lives with their family Lives in: House/apartment  OCCUPATION: Licensed conveyancer for [employer]   PLOF: Independent  PATIENT GOALS: decrease neck and shoulder pain to make home activities and driving more comfortable; be able to reach into cabinets  OBJECTIVE:   DIAGNOSTIC FINDINGS:  See imaging  PATIENT SURVEYS:  FOTO: 55% function; 66% predicted  COGNITION: Overall cognitive status: Within functional limits for tasks assessed  SENSATION: WFL  POSTURE: rounded shoulders and forward head  PALPATION: TTP to L upper trap, L cervical paraspinals   CERVICAL ROM:   Active ROM A/PROM (deg) eval  Flexion   Extension   Right lateral flexion   Left lateral flexion   Right rotation 75  Left rotation 35   (Blank rows = not tested)  UPPER EXTREMITY ROM:  Active ROM Right eval Left eval  Shoulder flexion WFL 105  Shoulder extension    Shoulder abduction [facility] 115  Shoulder adduction    Shoulder extension    Shoulder internal rotation T10 L2  Shoulder external rotation WFL 35  Elbow flexion    Elbow extension    Wrist flexion    Wrist extension    Wrist ulnar deviation    Wrist radial deviation    Wrist pronation    Wrist supination     (Blank rows = not tested)  UPPER EXTREMITY MMT:  MMT Right eval Left eval  Shoulder flexion [facility] 3+/5  Shoulder extension    Shoulder abduction [facility] 3+/5  Shoulder adduction    Shoulder extension    Shoulder internal rotation [facility] 3+/5  Shoulder external rotation [facility]  3+/5   Middle trapezius    Lower trapezius    Elbow flexion    Elbow extension    Wrist flexion    Wrist extension    Wrist ulnar deviation    Wrist radial deviation    Wrist pronation    Wrist supination    Grip strength 55 45   (Blank rows = not tested)  FUNCTIONAL TESTS:  DNT  TREATMENT: [facility] Adult PT Treatment:                                                DATE: 12/09/2022 Therapeutic Exercise: Upper trap stretch x 30" L Scapular retraction x 5  Row x 5 RTB Seated bilateral ER x 5 RTB Supine chin tuck x 5 - 5 sec hold  PATIENT EDUCATION:  Education details: eval findings, FOTO, HEP, POC Person educated: Patient Education method: Explanation, Demonstration, and Handouts Education comprehension: verbalized understanding and returned demonstration  HOME EXERCISE PROGRAM: Access Code: [email or web address] URL: [email or web address] Date: 12/09/2022 Prepared by: Edwinna Areola  Exercises - Seated Upper Trapezius Stretch  - 1 x daily - 7 x weekly - 2 reps - 30 sec hold - Seated Scapular Retraction  - 1 x daily - 7 x weekly - 2 sets - 10 reps - 3 sec hold - Scapular Retraction with Resistance  - 1 x daily - 7 x weekly - 3 sets - 10 reps - red band hold - Shoulder External Rotation and Scapular Retraction with Resistance  - 1 x daily - 7 x weekly - 3 sets - 10 reps - red band hold - Supine Chin Tuck  - 1 x daily - 7 x weekly - 2 sets - 10 reps - 3-5 sec hold  ASSESSMENT:  CLINICAL IMPRESSION: Patient is a 47 y.o. F who was seen today for physical therapy evaluation and treatment for acute on chronic neck and L shoulder pain. Physical findings are consistent with referring provider impression as pt demonstrates decrease in shoulder and cervical ROM and decrease in strength. Her FOTO score shows decrease in subjective functional ability below PLOF. Pt would benefit from skilled PT services working on decreasing pain and improving strength/ROM.    OBJECTIVE IMPAIRMENTS: decreased  activity tolerance, decreased mobility, decreased ROM, decreased strength, impaired UE functional use, and pain.   ACTIVITY LIMITATIONS: carrying, lifting, bathing, dressing, and reach over head  PARTICIPATION LIMITATIONS: driving, shopping, community activity, occupation, and yard work  PERSONAL FACTORS: Time since onset of injury/illness/exacerbation and 1-2 comorbidities: Cancer, Depression  are also affecting patient's functional outcome.   REHAB POTENTIAL: Excellent  CLINICAL DECISION MAKING: Stable/uncomplicated  EVALUATION COMPLEXITY: Low   GOALS: Goals reviewed with patient? No  SHORT TERM GOALS: Target date: 12/30/2022    Pt will be compliant and knowledgeable with initial HEP for improved comfort and carryover Baseline: initial HEP given  Goal status: INITIAL  2.  Pt will self report neck and left shoulder pain no greater than 6/10 for improved comfort and functional ability Baseline: 8/10 at worst Goal status: INITIAL    LONG TERM GOALS: Target date: 02/03/2023    Pt will improve FOTO function score to no less than 64% as proxy for functional improvement with work, home ADLs, and community activities Baseline: 55% function Goal status: INITIAL   2.  Pt will self report neck and left shoulder pain no greater  than 3/10 for improved comfort and functional ability Baseline: 8/10 at worst Goal status: INITIAL   3.  Pt will improve left shoulder ROM into flexion/abduction to no less than 140 degrees for improving her functional ability with home ADLs and work Baseline: see ROM chart Goal status: INITIAL  4.  Pt will improve left cervical rotation to no less than 60 degrees for improved comfort and functional ability with driving Baseline: see cervical ROM chart Goal status: INITIAL   PLAN:  PT FREQUENCY: 2x/week  PT DURATION: 8 weeks  PLANNED INTERVENTIONS: Therapeutic exercises, Therapeutic activity, Neuromuscular re-education, Balance training, Gait  training, Patient/Family education, Self Care, Joint mobilization, Dry Needling, Electrical stimulation, Cryotherapy, Moist heat, Vasopneumatic device, Manual therapy, and Re-evaluation  PLAN FOR NEXT SESSION: assess HEP response, DNF and periscapular strengthening, RTC strength  Check all possible CPT codes: 86578 - PT Re-evaluation, 97110- Therapeutic Exercise, [phone]- Neuro Re-education, [phone] - Gait Training, [phone] - Manual Therapy, [phone] - Therapeutic Activities, [phone] - Self Care, [id number] - Vaso, and [id number] - Aquatic therapy    Check all conditions that are expected to impact treatment: {Conditions expected to impact treatment:Musculoskeletal disorders   If treatment provided at initial evaluation, no treatment charged due to lack of authorization.        Eloy End, PT 12/09/2022, 12:24 PM

## 2022-12-09 NOTE — Progress Notes (Signed)
Follow Up Note: Gyn-Onc  Caitlin [name] 47 y.o. female  CC: She presents for a follow-up visit   HPI: The prior oncology history was reviewed.  Interval History: She denies abdominal distention, pain, weight loss or change in her bowel habits.  She was seen in follow by Dr. Bertis Ruddy in 6/24 and was felt to be NED.    Review of Systems  Review of Systems  Constitutional:  Negative for malaise/fatigue and weight loss.  Respiratory:  Negative for shortness of breath and wheezing.   Cardiovascular:  Negative for chest pain and leg swelling.  Gastrointestinal:  Negative for abdominal pain, blood in stool, constipation, nausea and vomiting.  Genitourinary:  Negative for dysuria, frequency, hematuria and urgency.  Musculoskeletal:  Negative for joint pain and myalgias.  Neurological:  Negative for weakness.  Psychiatric/Behavioral:  Negative for depression. The patient does not have insomnia.    Current medications, allergy, social history, past surgical history, past medical history, family history were all reviewed.    Vitals:  Blood pressure 120/74, pulse 100, temperature 98.4 F (36.9 C), temperature source Oral, resp. rate 20, height 5\' 2"  (1.575 m), weight 239 lb (108.4 kg), last menstrual period 01/23/2019, SpO2 100%.   Physical Exam Exam conducted with a chaperone present.  Constitutional:      General: She is not in acute distress. Cardiovascular:     Rate and Rhythm: Normal rate and regular rhythm.  Pulmonary:     Effort: Pulmonary effort is normal.     Breath sounds: Normal breath sounds. No wheezing or rhonchi.  Abdominal:     Palpations: Abdomen is soft.     Tenderness: There is no abdominal tenderness. There is no right CVA tenderness or left CVA tenderness.     Hernia: No hernia is present.  Genitourinary:    General: Normal vulva. Hyperpigmented skin tags, small patches    Urethra: No urethral lesion.     Vagina: No lesions. No bleeding Musculoskeletal:      Cervical back: Neck supple.     Right lower leg: No edema.     Left lower leg: No edema.  Lymphadenopathy:     Upper Body:     Right upper body: No supraclavicular adenopathy.     Left upper body: No supraclavicular adenopathy.     Lower Body: No right inguinal adenopathy. No left inguinal adenopathy.  Skin:    Findings: No rash.  Neurological:     Mental Status: She is oriented to person, place, and time.   Assessment/Plan:  Primary peritoneal adenocarcinoma (HCC) 47 y.o.w/ a h/o primary peritoneal carcinoma Negative symptom review, normal exam.  No evidence of recurrence  Normal tumor markers  >return in 6 mos; follow-up w/Dr. Bertis Ruddy in 1 yr   I personally spent 25 minutes face-to-face and non-face-to-face in the care of this patient, which includes all pre, intra, and post visit time on the date of service.     Antionette Char, MD

## 2022-12-09 NOTE — Patient Instructions (Signed)
Return in 6 months

## 2022-12-09 NOTE — Assessment & Plan Note (Addendum)
47 y.o.w/ a h/o primary peritoneal carcinoma Negative symptom review, normal exam.  No evidence of recurrence  Normal tumor markers  >return in 6 mos; follow-up w/Dr. Bertis Ruddy in 1 yr

## 2022-12-19 ENCOUNTER — Ambulatory Visit: Payer: Medicaid Other

## 2022-12-26 ENCOUNTER — Ambulatory Visit: Payer: Medicaid Other

## 2023-01-02 ENCOUNTER — Ambulatory Visit: Payer: Medicaid Other

## 2023-01-09 ENCOUNTER — Ambulatory Visit: Payer: Medicaid Other

## 2023-01-12 ENCOUNTER — Telehealth: Payer: Self-pay

## 2023-01-12 ENCOUNTER — Encounter: Payer: Self-pay | Admitting: Hematology and Oncology

## 2023-01-12 NOTE — Telephone Encounter (Signed)
Returned her call and left a message. She left a message asking about a work note. Ask her to call the office back.

## 2023-01-26 DIAGNOSIS — N951 Menopausal and female climacteric states: Secondary | ICD-10-CM | POA: Insufficient documentation

## 2023-02-04 ENCOUNTER — Encounter: Payer: Self-pay | Admitting: Orthopaedic Surgery

## 2023-02-04 ENCOUNTER — Ambulatory Visit: Payer: Medicaid Other | Admitting: Orthopaedic Surgery

## 2023-02-04 DIAGNOSIS — G5602 Carpal tunnel syndrome, left upper limb: Secondary | ICD-10-CM

## 2023-02-04 DIAGNOSIS — G5603 Carpal tunnel syndrome, bilateral upper limbs: Secondary | ICD-10-CM

## 2023-02-04 DIAGNOSIS — G5601 Carpal tunnel syndrome, right upper limb: Secondary | ICD-10-CM

## 2023-02-04 MED ORDER — BUPIVACAINE HCL 0.5 % IJ SOLN
1.0000 mL | INTRAMUSCULAR | Status: AC | PRN
Start: 2023-02-04 — End: 2023-02-04
  Administered 2023-02-04: 1 mL

## 2023-02-04 MED ORDER — LIDOCAINE HCL 1 % IJ SOLN
1.0000 mL | INTRAMUSCULAR | Status: AC | PRN
Start: 2023-02-04 — End: 2023-02-04
  Administered 2023-02-04: 1 mL

## 2023-02-04 MED ORDER — METHYLPREDNISOLONE ACETATE 40 MG/ML IJ SUSP
40.0000 mg | INTRAMUSCULAR | Status: AC | PRN
Start: 2023-02-04 — End: 2023-02-04
  Administered 2023-02-04: 40 mg

## 2023-02-04 NOTE — Progress Notes (Signed)
Office Visit Note   Patient: Caitlin [name]           Date of Birth: [birth year]           MRN: [id number] Visit Date: 02/04/2023              Requested by: [facility] [address] [place],  [place] [address] PCP: [facility]   Assessment & Plan: Visit Diagnoses:  1. Right carpal tunnel syndrome   2. Left carpal tunnel syndrome     Plan: Impression is 47 year old female with bilateral carpal tunnel syndrome.  Bilateral carpal tunnel injections performed today.  She tolerates well.  May need to consider repeating nerve conduction studies at a later point.  Follow-Up Instructions: No follow-ups on file.   Orders:  No orders of the defined types were placed in this encounter.  No orders of the defined types were placed in this encounter.     Procedures: Hand/UE Inj: bilateral carpal tunnel for carpal tunnel syndrome on 02/04/2023 8:13 AM Indications: pain Details: 25 G needle Medications (Right): 1 mL lidocaine 1 %; 1 mL bupivacaine 0.5 %; 40 mg methylPREDNISolone acetate 40 MG/ML Medications (Left): 1 mL lidocaine 1 %; 1 mL bupivacaine 0.5 %; 40 mg methylPREDNISolone acetate 40 MG/ML Outcome: tolerated well, no immediate complications Patient was prepped and draped in the usual sterile fashion.       Clinical Data: No additional findings.   Subjective: Chief Complaint  Patient presents with   Right Wrist - Follow-up    Carpal tunnel syndrome bilaterally   Left Wrist - Follow-up    HPI Caitlin [name] returns today for bilateral carpal tunnel syndrome.  Her last cortisone injection was about 3 months ago.  Requesting another 1.  She feels like her symptoms have gotten worse. Review of Systems  Constitutional: Negative.   HENT: Negative.    Eyes: Negative.   Respiratory: Negative.    Cardiovascular: Negative.   Endocrine: Negative.   Musculoskeletal: Negative.   Neurological: Negative.   Hematological: Negative.    Psychiatric/Behavioral: Negative.    All other systems reviewed and are negative.    Objective: Vital Signs: LMP 01/23/2019 (Within Weeks)   Physical Exam Vitals and nursing note reviewed.  Constitutional:      Appearance: She is well-developed.  HENT:     Head: Normocephalic and atraumatic.  Pulmonary:     Effort: Pulmonary effort is normal.  Abdominal:     Palpations: Abdomen is soft.  Musculoskeletal:     Cervical back: Neck supple.  Skin:    General: Skin is warm.     Capillary Refill: Capillary refill takes less than 2 seconds.  Neurological:     Mental Status: She is alert and oriented to person, place, and time.  Psychiatric:        Behavior: Behavior normal.        Thought Content: Thought content normal.        Judgment: Judgment normal.     Ortho Exam Exam unchanged Specialty Comments:  No specialty comments available.  Imaging: No results found.   PMFS History: Patient Active Problem List   Diagnosis Date Noted   Carpal tunnel syndrome of right wrist 02/19/2021   Obesity, Class III, BMI 40-49.9 (morbid obesity) (HCC) 07/11/2020   GERD (gastroesophageal reflux disease) 05/30/2019   Genetic testing 04/25/2019   Peripheral neuropathy due to chemotherapy (HCC) 04/17/2019   Paresthesia 03/29/2019   Chemotherapy adverse reaction 03/29/2019   History of  bariatric surgery 03/29/2019   Neck pain 03/29/2019   Insomnia disorder 03/14/2019   Family history of ovarian cancer    Family history of throat cancer    Family history of cervical cancer    Goals of care, counseling/discussion 02/23/2019   Primary peritoneal adenocarcinoma (HCC) 02/20/2019   Pain in joint, pelvic region and thigh 01/19/2019   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   Past Medical History:  Diagnosis Date   Bacterial vaginitis    Cancer (HCC)    Primary peritoneal carcinoma   Depression    Family history of cervical cancer    Family history of ovarian cancer    Family  history of throat cancer    Hypercholesterolemia 05/17/2017    Family History  Problem Relation Age of Onset   Ovarian cancer Mother 84   Diabetes Paternal Grandmother    Cancer Father        unknown type - possibly skin   HIV Father    Heart Problems Maternal Grandfather    Cancer Paternal Grandfather        unknown type, diagnosed older than 50   Cancer Maternal Uncle        unknown type, diagnosed older than 50   Throat cancer Maternal Uncle        diagnosed older than 33s, smoker   Cervical cancer Cousin        paternal cousin   Breast cancer Neg Hx    Colon cancer Neg Hx     Past Surgical History:  Procedure Laterality Date   ABDOMINAL HYSTERECTOMY     DIAGNOSTIC LAPAROSCOPY     DILATION AND CURETTAGE OF UTERUS     GASTRIC BYPASS  2017   HYSTEROSCOPY WITH D & C Bilateral 06/07/2013   Procedure: DILATATION AND CURETTAGE /HYSTEROSCOPY with bilateral tubal cathetrization  ;  Surgeon: Fermin Schwab, MD;  Location: [facility];  Service: Gynecology;  Laterality: Bilateral;   IR IMAGING GUIDED PORT INSERTION  03/03/2019   IR REMOVAL TUN ACCESS W/ PORT W/O FL MOD SED  07/27/2019   LAPAROSCOPY Left 06/07/2013   Procedure: LAPAROSCOPY DIAGNOSTIC with aspiration of left peritubal cyst  ;  Surgeon: Fermin Schwab, MD;  Location: [facility];  Service: Gynecology;  Laterality: Left;   LAPAROTOMY Right 06/07/2013   Procedure: LAPAROTOMY with bilateral corneal segmental salpingectomy right corneal anastamosis;  Surgeon: Fermin Schwab, MD;  Location: [facility];  Service: Gynecology;  Laterality: Right;   Social History   Occupational History   Occupation: receptionist  Tobacco Use   Smoking status: Never   Smokeless tobacco: Never  Vaping Use   Vaping status: Never Used  Substance and Sexual Activity   Alcohol use: No   Drug use: No   Sexual activity: Yes    Birth control/protection: None

## 2023-02-19 ENCOUNTER — Encounter: Payer: Self-pay | Admitting: Orthopaedic Surgery

## 2023-02-20 NOTE — Telephone Encounter (Signed)
Please order NCV/EMG for bilateral CTS.  Thanks.

## 2023-02-22 ENCOUNTER — Other Ambulatory Visit: Payer: Self-pay

## 2023-02-22 DIAGNOSIS — G5602 Carpal tunnel syndrome, left upper limb: Secondary | ICD-10-CM

## 2023-02-22 DIAGNOSIS — G5601 Carpal tunnel syndrome, right upper limb: Secondary | ICD-10-CM

## 2023-02-26 ENCOUNTER — Telehealth: Payer: Self-pay | Admitting: Physical Medicine and Rehabilitation

## 2023-02-26 NOTE — Telephone Encounter (Signed)
Pt is ready to schedule NCS pt stated just schedule it and she will see it on Mychart. Please advise no need to call

## 2023-03-02 NOTE — Telephone Encounter (Signed)
Patient called and stated to just schedule NCV. Scheduled 03/09/23

## 2023-03-09 ENCOUNTER — Encounter: Payer: Medicaid Other | Admitting: Physical Medicine and Rehabilitation

## 2023-03-10 ENCOUNTER — Ambulatory Visit: Payer: Medicaid Other | Admitting: Physical Medicine and Rehabilitation

## 2023-03-10 DIAGNOSIS — M79641 Pain in right hand: Secondary | ICD-10-CM

## 2023-03-10 DIAGNOSIS — R531 Weakness: Secondary | ICD-10-CM

## 2023-03-10 DIAGNOSIS — R202 Paresthesia of skin: Secondary | ICD-10-CM

## 2023-03-10 DIAGNOSIS — M542 Cervicalgia: Secondary | ICD-10-CM | POA: Diagnosis not present

## 2023-03-10 DIAGNOSIS — M79642 Pain in left hand: Secondary | ICD-10-CM

## 2023-03-10 NOTE — Progress Notes (Signed)
Functional Pain Scale - descriptive words and definitions  Intense (8)    Cannot complete any ADLs without much assistance/cannot concentrate/conversation is difficult/unable to sleep and unable to use distraction. Severe range order  Average Pain 8  BilUE NCS, she has pain, numbness, tingling and burning sensation in both arms down to hands. Its worse on the right side. She has swelling on the left. She has difficulty grasping and holding on to things.

## 2023-03-11 NOTE — Procedures (Signed)
EMG & NCV Findings: Evaluation of the right median motor nerve showed prolonged distal onset latency (4.4 ms).  The left median (across palm) sensory nerve showed no response (Palm).  The right median (across palm) sensory nerve showed no response (Palm) and prolonged distal peak latency (4.4 ms).  All remaining nerves (as indicated in the following tables) were within normal limits.  Left vs. Right side comparison data for the median motor nerve indicates abnormal L-R latency difference (1.0 ms).    All examined muscles (as indicated in the following table) showed no evidence of electrical instability.    Impression: The above electrodiagnostic study is ABNORMAL and reveals evidence of a moderate right median nerve entrapment at the wrist (carpal tunnel syndrome) affecting sensory and motor components.   There is no significant electrodiagnostic evidence of any other focal nerve entrapment (in particular normal left sided median nerve conductions), brachial plexopathy or cervical radiculopathy in either upper limb.    **This electrodiagnostic study cannot rule out small fiber polyneuropathy and dysesthesias from central pain syndromes such as stroke or central pain sensitization syndromes such as fibromyalgia.  Myotomal referral pain from trigger points is also not excluded.  Recommendations: 1.  Follow-up with referring physician. 2.  Continue current management of symptoms. 3.  Continue use of resting splint at night-time and as needed during the day. 4.  Suggest surgical evaluation.  ___________________________ Naaman Plummer FAAPMR Board Certified, American Board of Physical Medicine and Rehabilitation    Nerve Conduction Studies Anti Sensory Summary Table   Stim Site NR Peak (ms) Norm Peak (ms) P-T Amp (V) Norm P-T Amp Site1 Site2 Delta-P (ms) Dist (cm) Vel (m/s) Norm Vel (m/s)  Left Median Acr Palm Anti Sensory (2nd Digit)  31.8C  Wrist    3.4 <3.6 40.3 >10 Wrist Palm  0.0    Palm  *NR  <2.0          Right Median Acr Palm Anti Sensory (2nd Digit)  30.8C  Wrist    *4.4 <3.6 21.1 >10 Wrist Palm  0.0    Palm *NR  <2.0          Right Radial Anti Sensory (Base 1st Digit)  31.1C  Wrist    1.9 <3.1 45.7  Wrist Base 1st Digit 1.9 0.0    Right Ulnar Anti Sensory (5th Digit)  31.3C  Wrist    3.3 <3.7 23.4 >15.0 Wrist 5th Digit 3.3 14.0 42 >38   Motor Summary Table   Stim Site NR Onset (ms) Norm Onset (ms) O-P Amp (mV) Norm O-P Amp Site1 Site2 Delta-0 (ms) Dist (cm) Vel (m/s) Norm Vel (m/s)  Left Median Motor (Abd Poll Brev)  32.6C  Wrist    3.4 <4.2 6.7 >5 Elbow Wrist 3.3 19.0 58 >50  Elbow    6.7  6.5         Right Median Motor (Abd Poll Brev)  31.5C  Wrist    *4.4 <4.2 10.4 >5 Elbow Wrist 3.6 18.5 51 >50  Elbow    8.0  10.0         Right Ulnar Motor (Abd Dig Min)  31.6C  Wrist    3.0 <4.2 10.0 >3 B Elbow Wrist 2.9 18.5 64 >53  B Elbow    5.9  10.3  A Elbow B Elbow 1.1 10.5 95 >53  A Elbow    7.0  10.1          EMG   Side Muscle Nerve Root Ins Act Fibs  Psw Amp Dur Poly Recrt Int Dennie Bible Comment  Right Abd Poll Brev Median C8-T1 Nml Nml Nml Nml Nml 0 Nml Nml   Right 1stDorInt Ulnar C8-T1 Nml Nml Nml Nml Nml 0 Nml Nml   Right PronatorTeres Median C6-7 Nml Nml Nml Nml Nml 0 Nml Nml   Right Biceps Musculocut C5-6 Nml Nml Nml Nml Nml 0 Nml Nml   Right Deltoid Axillary C5-6 Nml Nml Nml Nml Nml 0 Nml Nml     Nerve Conduction Studies Anti Sensory Left/Right Comparison   Stim Site L Lat (ms) R Lat (ms) L-R Lat (ms) L Amp (V) R Amp (V) L-R Amp (%) Site1 Site2 L Vel (m/s) R Vel (m/s) L-R Vel (m/s)  Median Acr Palm Anti Sensory (2nd Digit)  31.8C  Wrist 3.4 *4.4 1.0 40.3 21.1 47.6 Wrist Palm     Palm             Radial Anti Sensory (Base 1st Digit)  31.1C  Wrist  1.9   45.7  Wrist Base 1st Digit     Ulnar Anti Sensory (5th Digit)  31.3C  Wrist  3.3   23.4  Wrist 5th Digit  42    Motor Left/Right Comparison   Stim Site L Lat (ms) R Lat (ms) L-R Lat (ms) L Amp  (mV) R Amp (mV) L-R Amp (%) Site1 Site2 L Vel (m/s) R Vel (m/s) L-R Vel (m/s)  Median Motor (Abd Poll Brev)  32.6C  Wrist 3.4 *4.4 *1.0 6.7 10.4 35.6 Elbow Wrist 58 51 7  Elbow 6.7 8.0 1.3 6.5 10.0 35.0       Ulnar Motor (Abd Dig Min)  31.6C  Wrist  3.0   10.0  B Elbow Wrist  64   B Elbow  5.9   10.3  A Elbow B Elbow  95   A Elbow  7.0   10.1           Waveforms:

## 2023-03-17 ENCOUNTER — Encounter: Payer: Self-pay | Admitting: Physical Medicine and Rehabilitation

## 2023-03-17 NOTE — Progress Notes (Addendum)
Kynisha Dorsa - 47 y.o. female MRN [id number]  Date of birth: 03/11/1976  Office Visit Note: Visit Date: 03/10/2023 PCP: [facility] Referred by: Tarry Kos, MD  Subjective: Chief Complaint  Patient presents with   Left Arm - Pain, Numbness, Tingling   Right Arm - Pain, Numbness, Tingling   HPI: Unnamed Forsyth is a 47 y.o. female who comes in today at the request of Dr. Glee Arvin for evaluation and management of chronic, worsening and severe pain, numbness and tingling in the Bilateral upper extremities.  Patient is Right hand dominant.  She reports a chronic long-term history of bilateral hand pain and neck pain with some pain at times down the arms.  She has been considered to have carpal tunnel syndrome and is actually responded to carpal tunnel injections with good relief up until recently.  Last injection performed did not seem to help very much.  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  She is not diabetic.  She has had no advanced imaging of the cervical spine.  She does endorse some weakness.  She has had splints and medications without much relief.   I spent more than 30 minutes speaking face-to-face with the patient with 50% of the time in counseling and discussing coordination of care.        Review of Systems  Musculoskeletal:  Positive for joint pain and neck pain.  Neurological:  Positive for tingling and weakness.  All other systems reviewed and are negative.  Otherwise per HPI.  Assessment & Plan: Visit Diagnoses:    ICD-10-CM   1. Paresthesia of skin  R20.2 NCV with EMG (electromyography)    CANCELED: NCV with EMG (electromyography)    2. Cervicalgia  M54.2     3. Bilateral hand pain  M79.641    M79.642     4. Weakness  R53.1        Plan: Impression: Chronic worsening bilateral hand pain with numbness and tingling consistent  somewhat with median nerve neuropathy and diagnostically has gotten better in the past with injection.  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  No prior electrodiagnostic study however.  Also endorses neck pain.  Electrodiagnostic study performed today.  The above electrodiagnostic study is ABNORMAL and reveals evidence of a moderate right median nerve entrapment at the wrist (carpal tunnel syndrome) affecting sensory and motor components.   There is no significant electrodiagnostic evidence of any other focal nerve entrapment (in particular normal left sided median nerve conductions), brachial plexopathy or cervical radiculopathy in either upper limb.    **This electrodiagnostic study cannot rule out small fiber polyneuropathy and dysesthesias from central pain syndromes such as stroke or central pain sensitization syndromes such as fibromyalgia.  Myotomal referral pain from trigger points is also not excluded.  Recommendations: 1.  Follow-up with referring physician. 2.  Continue current management of symptoms. 3.  Continue use of resting splint at night-time and as needed during the day. 4.  Suggest surgical evaluation.  Meds & Orders: No orders of the defined types were placed in this encounter.   Orders Placed This Encounter  Procedures   NCV with EMG (electromyography)    Follow-up: Return for  Glee Arvin, MD.   Procedures: No procedures performed  EMG & NCV Findings: Evaluation of the right median motor nerve showed prolonged distal onset latency (4.4 ms).  The left median (across  palm) sensory nerve showed no response (Palm).  The right median (across palm) sensory nerve showed no response (Palm) and prolonged distal peak latency (4.4 ms).  All remaining nerves (as indicated in the following tables) were within normal limits.  Left vs. Right side comparison data for the median motor nerve indicates abnormal L-R latency difference (1.0 ms).    All examined muscles  (as indicated in the following table) showed no evidence of electrical instability.    Impression: The above electrodiagnostic study is ABNORMAL and reveals evidence of a moderate right median nerve entrapment at the wrist (carpal tunnel syndrome) affecting sensory and motor components.   There is no significant electrodiagnostic evidence of any other focal nerve entrapment (in particular normal left sided median nerve conductions), brachial plexopathy or cervical radiculopathy in either upper limb.    **This electrodiagnostic study cannot rule out small fiber polyneuropathy and dysesthesias from central pain syndromes such as stroke or central pain sensitization syndromes such as fibromyalgia.  Myotomal referral pain from trigger points is also not excluded.  Recommendations: 1.  Follow-up with referring physician. 2.  Continue current management of symptoms. 3.  Continue use of resting splint at night-time and as needed during the day. 4.  Suggest surgical evaluation.  ___________________________ Naaman Plummer FAAPMR Board Certified, American Board of Physical Medicine and Rehabilitation    Nerve Conduction Studies Anti Sensory Summary Table   Stim Site NR Peak (ms) Norm Peak (ms) P-T Amp (V) Norm P-T Amp Site1 Site2 Delta-P (ms) Dist (cm) Vel (m/s) Norm Vel (m/s)  Left Median Acr Palm Anti Sensory (2nd Digit)  31.8C  Wrist    3.4 <3.6 40.3 >10 Wrist Palm  0.0    Palm *NR  <2.0          Right Median Acr Palm Anti Sensory (2nd Digit)  30.8C  Wrist    *4.4 <3.6 21.1 >10 Wrist Palm  0.0    Palm *NR  <2.0          Right Radial Anti Sensory (Base 1st Digit)  31.1C  Wrist    1.9 <3.1 45.7  Wrist Base 1st Digit 1.9 0.0    Right Ulnar Anti Sensory (5th Digit)  31.3C  Wrist    3.3 <3.7 23.4 >15.0 Wrist 5th Digit 3.3 14.0 42 >38   Motor Summary Table   Stim Site NR Onset (ms) Norm Onset (ms) O-P Amp (mV) Norm O-P Amp Site1 Site2 Delta-0 (ms) Dist (cm) Vel (m/s) Norm Vel (m/s)  Left  Median Motor (Abd Poll Brev)  32.6C  Wrist    3.4 <4.2 6.7 >5 Elbow Wrist 3.3 19.0 58 >50  Elbow    6.7  6.5         Right Median Motor (Abd Poll Brev)  31.5C  Wrist    *4.4 <4.2 10.4 >5 Elbow Wrist 3.6 18.5 51 >50  Elbow    8.0  10.0         Right Ulnar Motor (Abd Dig Min)  31.6C  Wrist    3.0 <4.2 10.0 >3 B Elbow Wrist 2.9 18.5 64 >53  B Elbow    5.9  10.3  A Elbow B Elbow 1.1 10.5 95 >53  A Elbow    7.0  10.1          EMG   Side Muscle Nerve Root Ins Act Fibs Psw Amp Dur Poly Recrt Int Dennie Bible Comment  Right Abd Poll Brev Median C8-T1 Nml Nml Nml Nml Nml 0 Nml  Nml   Right 1stDorInt Ulnar C8-T1 Nml Nml Nml Nml Nml 0 Nml Nml   Right PronatorTeres Median C6-7 Nml Nml Nml Nml Nml 0 Nml Nml   Right Biceps Musculocut C5-6 Nml Nml Nml Nml Nml 0 Nml Nml   Right Deltoid Axillary C5-6 Nml Nml Nml Nml Nml 0 Nml Nml     Nerve Conduction Studies Anti Sensory Left/Right Comparison   Stim Site L Lat (ms) R Lat (ms) L-R Lat (ms) L Amp (V) R Amp (V) L-R Amp (%) Site1 Site2 L Vel (m/s) R Vel (m/s) L-R Vel (m/s)  Median Acr Palm Anti Sensory (2nd Digit)  31.8C  Wrist 3.4 *4.4 1.0 40.3 21.1 47.6 Wrist Palm     Palm             Radial Anti Sensory (Base 1st Digit)  31.1C  Wrist  1.9   45.7  Wrist Base 1st Digit     Ulnar Anti Sensory (5th Digit)  31.3C  Wrist  3.3   23.4  Wrist 5th Digit  42    Motor Left/Right Comparison   Stim Site L Lat (ms) R Lat (ms) L-R Lat (ms) L Amp (mV) R Amp (mV) L-R Amp (%) Site1 Site2 L Vel (m/s) R Vel (m/s) L-R Vel (m/s)  Median Motor (Abd Poll Brev)  32.6C  Wrist 3.4 *4.4 *1.0 6.7 10.4 35.6 Elbow Wrist 58 51 7  Elbow 6.7 8.0 1.3 6.5 10.0 35.0       Ulnar Motor (Abd Dig Min)  31.6C  Wrist  3.0   10.0  B Elbow Wrist  64   B Elbow  5.9   10.3  A Elbow B Elbow  95   A Elbow  7.0   10.1           Waveforms:                 Clinical History: 02/19/2021 Nerve conduction study:  Right sural, superficial peroneal, ulnar sensory responses were normal.   Right median sensory responses peak latency was mildly prolonged, with mildly decreased snap amplitude.  Left median sensory responses were within normal limits  Bilateral median mixed response was more than 0.4 ms prolonged in comparison to ipsilateral ulnar mixed response  Right tibial, peroneal to EDB, ulnar, bilateral median motor responses were normal.  Electromyography:  Selected needle examination of right upper extremity muscles were normal, with exception of slightly decreased recruitment pattern at right abductor pollicis brevis.    Conclusion: This is an abnormal study.  There is electrodiagnostic evidence of mild bilateral median neuropathy across the wrist, consistent with mild bilateral carpal tunnel syndromes, right worse than left, demyelinating in nature, there is no evidence of axonal loss.  There is no evidence of right cervical radiculopathy.       ------------------------------- Levert Feinstein M.D. PhD --------  01/03/2019 NCV & EMG Findings: Extensive electrodiagnostic testing of the right upper extremity and additional studies of the left shows:  1. Right mixed palmar sensory responses show prolonged latency (Median Palm, 2.3 ms).  Left mixed palmar and bilateral median and ulnar sensory responses are within normal limits.   2. Bilateral median and ulnar motor responses are within normal limits.   3. There is no evidence of active or chronic motor axonal loss changes affecting any of the tested muscles.  Motor unit configuration and recruitment pattern is within normal limits.     Impression: 1. Right median neuropathy at or distal to the wrist (very  mild) consistent with a clinical diagnosis of carpal tunnel syndrome.   2. There is no evidence of a cervical radiculopathy affecting either upper extremity or left carpal tunnel syndrome.     ___________________________ Nita Sickle, DO   She reports that she has never smoked. She has never used smokeless tobacco. No  results for input(s): "HGBA1C", "LABURIC" in the last 8760 hours.  Objective:  VS:  HT:    WT:   BMI:     BP:   HR: bpm  TEMP: ( )  RESP:  Physical Exam Vitals and nursing note reviewed.  Constitutional:      General: She is not in acute distress.    Appearance: Normal appearance. She is well-developed. She is not ill-appearing.  HENT:     Head: Normocephalic and atraumatic.  Eyes:     Conjunctiva/sclera: Conjunctivae normal.     Pupils: Pupils are equal, round, and reactive to light.  Cardiovascular:     Rate and Rhythm: Normal rate.     Pulses: Normal pulses.  Pulmonary:     Effort: Pulmonary effort is normal.  Musculoskeletal:        General: No swelling, tenderness or deformity.     Right lower leg: No edema.     Left lower leg: No edema.     Comments: Inspection reveals no atrophy of the bilateral APB or FDI or hand intrinsics. There is no swelling, color changes, allodynia or dystrophic changes. There is 5 out of 5 strength in the bilateral wrist extension, finger abduction and long finger flexion. There is intact sensation to light touch in all dermatomal and peripheral nerve distributions. There is a negative Froment's test bilaterally. There is a negative Tinel's test at the bilateral wrist and elbow. There is a positive Phalen's test bilaterally. There is a negative Hoffmann's test bilaterally.  Skin:    General: Skin is warm and dry.     Findings: No erythema or rash.  Neurological:     General: No focal deficit present.     Mental Status: She is alert and oriented to person, place, and time.     Sensory: No sensory deficit.     Motor: No weakness or abnormal muscle tone.     Coordination: Coordination normal.     Gait: Gait normal.  Psychiatric:        Mood and Affect: Mood normal.        Behavior: Behavior normal.     Ortho Exam  Imaging: No results found.  Past Medical/Family/Surgical/Social History: Medications & Allergies reviewed per EMR, new  medications updated. Patient Active Problem List   Diagnosis Date Noted   Perimenopausal vasomotor symptoms 01/26/2023   Carpal tunnel syndrome of right wrist 02/19/2021   Obesity, Class III, BMI 40-49.9 (morbid obesity) (HCC) 07/11/2020   GERD (gastroesophageal reflux disease) 05/30/2019   Genetic testing 04/25/2019   Peripheral neuropathy due to chemotherapy (HCC) 04/17/2019   Paresthesia 03/29/2019   Chemotherapy adverse reaction 03/29/2019   History of bariatric surgery 03/29/2019   Neck pain 03/29/2019   Insomnia disorder 03/14/2019   Family history of ovarian cancer    Family history of throat cancer    Family history of cervical cancer    Goals of care, counseling/discussion 02/23/2019   Primary peritoneal adenocarcinoma (HCC) 02/20/2019   Pain in joint, pelvic region and thigh 01/19/2019   Numbness 12/05/2018   Depression, major, in remission (HCC) 10/20/2016   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   Past Medical History:  Diagnosis  Date   Bacterial vaginitis    Cancer [facility])    Primary peritoneal carcinoma   Depression    Family history of cervical cancer    Family history of ovarian cancer    Family history of throat cancer    Hypercholesterolemia 05/17/2017   Family History  Problem Relation Age of Onset   Ovarian cancer Mother 59   Diabetes Paternal Grandmother    Cancer Father        unknown type - possibly skin   HIV Father    Heart Problems Maternal Grandfather    Cancer Paternal Grandfather        unknown type, diagnosed older than 50   Cancer Maternal Uncle        unknown type, diagnosed older than 50   Throat cancer Maternal Uncle        diagnosed older than 61s, smoker   Cervical cancer Cousin        paternal cousin   Breast cancer Neg Hx    Colon cancer Neg Hx    Past Surgical History:  Procedure Laterality Date   ABDOMINAL HYSTERECTOMY     DIAGNOSTIC LAPAROSCOPY     DILATION AND CURETTAGE OF UTERUS     GASTRIC BYPASS  2017    HYSTEROSCOPY WITH D & C Bilateral 06/07/2013   Procedure: DILATATION AND CURETTAGE /HYSTEROSCOPY with bilateral tubal cathetrization  ;  Surgeon: Fermin Schwab, MD;  Location: [facility];  Service: Gynecology;  Laterality: Bilateral;   IR IMAGING GUIDED PORT INSERTION  03/03/2019   IR REMOVAL TUN ACCESS W/ PORT W/O FL MOD SED  07/27/2019   LAPAROSCOPY Left 06/07/2013   Procedure: LAPAROSCOPY DIAGNOSTIC with aspiration of left peritubal cyst  ;  Surgeon: Fermin Schwab, MD;  Location: [facility];  Service: Gynecology;  Laterality: Left;   LAPAROTOMY Right 06/07/2013   Procedure: LAPAROTOMY with bilateral corneal segmental salpingectomy right corneal anastamosis;  Surgeon: Fermin Schwab, MD;  Location: [facility];  Service: Gynecology;  Laterality: Right;   Social History   Occupational History   Occupation: receptionist  Tobacco Use   Smoking status: Never   Smokeless tobacco: Never  Vaping Use   Vaping status: Never Used  Substance and Sexual Activity   Alcohol use: No   Drug use: No   Sexual activity: Yes    Birth control/protection: None

## 2023-03-30 ENCOUNTER — Telehealth: Payer: Self-pay | Admitting: *Deleted

## 2023-03-30 NOTE — Telephone Encounter (Signed)
Spoke with [name] who called the office to schedule her 6 month follow up with Dr. Tamela Oddi. Pt was given an appointment on Wednesday, December 11 th at 3:15. Pt agreed to date and time and had no further concerns or questions at this time.

## 2023-04-07 NOTE — Telephone Encounter (Signed)
Telephone call  

## 2023-04-22 ENCOUNTER — Ambulatory Visit: Payer: Medicaid Other | Admitting: Orthopaedic Surgery

## 2023-04-28 ENCOUNTER — Inpatient Hospital Stay: Payer: Medicaid Other | Attending: Obstetrics & Gynecology | Admitting: Obstetrics & Gynecology

## 2023-04-28 ENCOUNTER — Other Ambulatory Visit: Payer: Self-pay

## 2023-04-28 ENCOUNTER — Encounter: Payer: Self-pay | Admitting: Obstetrics & Gynecology

## 2023-04-28 VITALS — BP 144/82 | HR 88 | Temp 98.5°F | Resp 20 | Wt 255.8 lb

## 2023-04-28 DIAGNOSIS — C482 Malignant neoplasm of peritoneum, unspecified: Secondary | ICD-10-CM

## 2023-04-28 DIAGNOSIS — Z8589 Personal history of malignant neoplasm of other organs and systems: Secondary | ICD-10-CM | POA: Diagnosis present

## 2023-04-28 NOTE — Assessment & Plan Note (Addendum)
47 y.o.w/ a h/o primary peritoneal carcinoma Negative symptom review, normal exam.  No evidence of recurrence  Normal tumor markers   >return in 6 mos; follow-up w/Dr. Bertis Ruddy in 1 yr >CA125 tumor marker order for prior to next visit

## 2023-04-28 NOTE — Progress Notes (Signed)
Follow Up Note: Gyn-Onc  Caitlin [name] 47 y.o. female  CC: She presents for a follow-up visit   HPI: The prior oncology history was reviewed.  Interval History: She denies abdominal distention, pain, weight loss or change in her bowel habits.    She reports no new health concerns or changes in her condition since her last visit. Her overall health status appears stable with no new symptoms or concerns raised during the consultation. The patient is preparing for the upcoming holiday season and appears in good spirits.    Review of Systems  Review of Systems  Constitutional:  Negative for malaise/fatigue and weight loss.  Respiratory:  Negative for shortness of breath and wheezing.   Cardiovascular:  Negative for chest pain and leg swelling.  Gastrointestinal:  Negative for abdominal pain, blood in stool, constipation, nausea and vomiting.  Genitourinary:  Negative for dysuria, frequency, hematuria and urgency.  Musculoskeletal:  Negative for joint pain and myalgias.  Neurological:  Negative for weakness.  Psychiatric/Behavioral:  Negative for depression. The patient does not have insomnia.    Current medications, allergy, social history, past surgical history, past medical history, family history were all reviewed.    Vitals:  BP (!) 144/82 Comment: manual recheck  Pulse 88   Temp 98.5 F (36.9 C) (Oral)   Resp 20   Wt 255 lb 12.8 oz (116 kg)   LMP 01/23/2019 (Within Weeks)   SpO2 100%   BMI 46.79 kg/m     Physical Exam Exam conducted with a chaperone present.  Constitutional:      General: She is not in acute distress. Cardiovascular:     Rate and Rhythm: Normal rate and regular rhythm.  Pulmonary:     Effort: Pulmonary effort is normal.     Breath sounds: Normal breath sounds. No wheezing or rhonchi.  Abdominal:     Palpations: Abdomen is soft.     Tenderness: There is no abdominal tenderness. There is no right CVA tenderness or left CVA tenderness.     Hernia:  No hernia is present.  Genitourinary:    General: Normal vulva. Hyperpigmented skin tags, small patches    Urethra: No urethral lesion.     Vagina: No lesions. No bleeding Musculoskeletal:     Cervical back: Neck supple.     Right lower leg: No edema.     Left lower leg: No edema.  Lymphadenopathy:     Upper Body:     Right upper body: No supraclavicular adenopathy.     Left upper body: No supraclavicular adenopathy.     Lower Body: No right inguinal adenopathy. No left inguinal adenopathy.  Skin:    Findings: No rash.  Neurological:     Mental Status: She is oriented to person, place, and time.   Assessment/Plan:  Primary peritoneal adenocarcinoma (HCC) 47 y.o.w/ a h/o primary peritoneal carcinoma Negative symptom review, normal exam.  No evidence of recurrence  Normal tumor markers   >return in 6 mos; follow-up w/Dr. Bertis Ruddy in 1 yr >CA125 tumor marker order for prior to next visit   I personally spent 25 minutes face-to-face and non-face-to-face in the care of this patient, which includes all pre, intra, and post visit time on the date of service.     Antionette Char, MD

## 2023-04-28 NOTE — Patient Instructions (Addendum)
Return in 6 months

## 2023-04-29 ENCOUNTER — Inpatient Hospital Stay: Payer: Medicaid Other

## 2023-04-29 DIAGNOSIS — Z8589 Personal history of malignant neoplasm of other organs and systems: Secondary | ICD-10-CM | POA: Diagnosis not present

## 2023-04-29 DIAGNOSIS — C482 Malignant neoplasm of peritoneum, unspecified: Secondary | ICD-10-CM

## 2023-04-29 LAB — CBC WITH DIFFERENTIAL/PLATELET
Abs Immature Granulocytes: 0.03 10*3/uL (ref 0.00–0.07)
Basophils Absolute: 0 10*3/uL (ref 0.0–0.1)
Basophils Relative: 0 %
Eosinophils Absolute: 0.3 10*3/uL (ref 0.0–0.5)
Eosinophils Relative: 3 %
HCT: 42.1 % (ref 36.0–46.0)
Hemoglobin: 13.6 g/dL (ref 12.0–15.0)
Immature Granulocytes: 0 %
Lymphocytes Relative: 13 %
Lymphs Abs: 1.1 10*3/uL (ref 0.7–4.0)
MCH: 29.6 pg (ref 26.0–34.0)
MCHC: 32.3 g/dL (ref 30.0–36.0)
MCV: 91.5 fL (ref 80.0–100.0)
Monocytes Absolute: 0.5 10*3/uL (ref 0.1–1.0)
Monocytes Relative: 6 %
Neutro Abs: 6.7 10*3/uL (ref 1.7–7.7)
Neutrophils Relative %: 78 %
Platelets: 267 10*3/uL (ref 150–400)
RBC: 4.6 MIL/uL (ref 3.87–5.11)
RDW: 11.9 % (ref 11.5–15.5)
WBC: 8.6 10*3/uL (ref 4.0–10.5)
nRBC: 0 % (ref 0.0–0.2)

## 2023-04-29 LAB — COMPREHENSIVE METABOLIC PANEL
ALT: 46 U/L — ABNORMAL HIGH (ref 0–44)
AST: 49 U/L — ABNORMAL HIGH (ref 15–41)
Albumin: 4.2 g/dL (ref 3.5–5.0)
Alkaline Phosphatase: 65 U/L (ref 38–126)
Anion gap: 9 (ref 5–15)
BUN: 11 mg/dL (ref 6–20)
CO2: 30 mmol/L (ref 22–32)
Calcium: 9.9 mg/dL (ref 8.9–10.3)
Chloride: 103 mmol/L (ref 98–111)
Creatinine, Ser: 0.7 mg/dL (ref 0.44–1.00)
GFR, Estimated: >60 mL/min (ref >60)
Glucose, Bld: 148 mg/dL — ABNORMAL HIGH (ref 70–99)
Potassium: 4 mmol/L (ref 3.5–5.1)
Sodium: 142 mmol/L (ref 135–145)
Total Bilirubin: 0.4 mg/dL (ref <1.2)
Total Protein: 8 g/dL (ref 6.5–8.1)

## 2023-04-30 LAB — CA 125: Cancer Antigen (CA) 125: 5.3 U/mL (ref 0.0–38.1)

## 2023-05-04 ENCOUNTER — Ambulatory Visit: Payer: Medicaid Other | Admitting: Orthopaedic Surgery

## 2023-05-10 ENCOUNTER — Telehealth: Payer: Self-pay | Admitting: *Deleted

## 2023-05-10 NOTE — Telephone Encounter (Signed)
-----   Message from Doylene Bode sent at 05/10/2023 11:25 AM EST ----- Please let her know her CA 125 is 5.3. This is within normal range and stable compared to previous values. We only needed the CA 125 drawn for our visit but the lab drew orders that were standing by Dr. Bertis Ruddy. Your CBC is within normal range.   On your metabolic panel, your glucose was elevated at 148 but could be expected if she had eaten recently. Her liver enzymes were slightly elevated, more from previous values. Would fax results to PCP and have pt follow up with PCP on this. Would avoid taking tylenol frequently, alcohol. Looks like on scan from 2021, they saw excess fat in her liver. Would be something to follow up with PCP about.

## 2023-05-10 NOTE — Telephone Encounter (Addendum)
Spoke with [name] and relayed message from Warner Mccreedy, NP that patient's CA 125 is 5.3, this result is within normal range and stable compared to previous values. Your CBC is within normal range. On your metabolic panel, your glucose was elevated at 148 but could be expected if you were not fasting for this lab. Liver enzymes were slightly elevated, more from previous values. Results will be faxed to PCP and advised pt to follow up with her PCP on this. Also advised patient to avoid taking tylenol frequently and alcohol. Looks like on scan from 2021, they saw excess fat in the liver. Would also follow up with PCP about this as well. Pt verbalized understanding and thanked the office for calling.   Results faxed to patient's PCP [facility] Misty Stanley PA  Fax # [phone].

## 2023-06-14 ENCOUNTER — Ambulatory Visit
Admission: EM | Admit: 2023-06-14 | Discharge: 2023-06-14 | Disposition: A | Discharge status: Home / Self Care | Payer: Medicaid Other | Attending: Family Medicine | Admitting: Family Medicine

## 2023-06-14 DIAGNOSIS — K047 Periapical abscess without sinus: Secondary | ICD-10-CM

## 2023-06-14 MED ORDER — IBUPROFEN 600 MG PO TABS: 600.0000 mg | ORAL_TABLET | Freq: Four times a day (QID) | ORAL | 0 refills | Status: DC | PRN

## 2023-06-14 MED ORDER — AMOXICILLIN-POT CLAVULANATE 875-125 MG PO TABS: 1.0000 | ORAL_TABLET | Freq: Two times a day (BID) | ORAL | 0 refills | Status: DC

## 2023-06-14 NOTE — ED Triage Notes (Signed)
Pt reports pain in left ear and dental pain x 3 days.

## 2023-06-14 NOTE — ED Provider Notes (Signed)
[facility]  Note:  This document was prepared using Conservation officer, historic buildings and may include unintentional dictation errors.  MRN: [id number] DOB: [birth year]  Subjective:   Caitlin [name] is a 48 y.o. female presenting for 3-day history of acute onset left-sided dental pain that radiates into the left ear.  Patient does not get regular dental care.  No fever, sinus symptoms, throat pain, cough, ear drainage, dizziness, history of ear infections.  No current facility-administered medications for this encounter.  Current Outpatient Medications:    ZEPBOUND 2.5 MG/0.5ML Pen, SMARTSIG:2.5 Milligram(s) SUB-Q Once a Week, Disp: , Rfl:    Multiple Vitamin (MULTIVITAMIN) tablet, Take 1 tablet by mouth daily., Disp: , Rfl:    olmesartan-hydrochlorothiazide (BENICAR HCT) 20-12.5 MG tablet, Take 1 tablet by mouth every morning., Disp: , Rfl:    No Known Allergies  Past Medical History:  Diagnosis Date   Bacterial vaginitis    Cancer (HCC)    Primary peritoneal carcinoma   Depression    Family history of cervical cancer    Family history of ovarian cancer    Family history of throat cancer    Hypercholesterolemia 05/17/2017     Past Surgical History:  Procedure Laterality Date   ABDOMINAL HYSTERECTOMY     DIAGNOSTIC LAPAROSCOPY     DILATION AND CURETTAGE OF UTERUS     GASTRIC BYPASS  2017   HYSTEROSCOPY WITH D & C Bilateral 06/07/2013   Procedure: DILATATION AND CURETTAGE /HYSTEROSCOPY with bilateral tubal cathetrization  ;  Surgeon: Fermin Schwab, MD;  Location: [facility];  Service: Gynecology;  Laterality: Bilateral;   IR IMAGING GUIDED PORT INSERTION  03/03/2019   IR REMOVAL TUN ACCESS W/ PORT W/O FL MOD SED  07/27/2019   LAPAROSCOPY Left 06/07/2013   Procedure: LAPAROSCOPY DIAGNOSTIC with aspiration of left peritubal cyst  ;  Surgeon: Fermin Schwab, MD;  Location: [facility];  Service: Gynecology;  Laterality: Left;   LAPAROTOMY Right 06/07/2013    Procedure: LAPAROTOMY with bilateral corneal segmental salpingectomy right corneal anastamosis;  Surgeon: Fermin Schwab, MD;  Location: [facility];  Service: Gynecology;  Laterality: Right;    Family History  Problem Relation Age of Onset   Ovarian cancer Mother 61   Diabetes Paternal Grandmother    Cancer Father        unknown type - possibly skin   HIV Father    Heart Problems Maternal Grandfather    Cancer Paternal Grandfather        unknown type, diagnosed older than 50   Cancer Maternal Uncle        unknown type, diagnosed older than 50   Throat cancer Maternal Uncle        diagnosed older than 22s, smoker   Cervical cancer Cousin        paternal cousin   Breast cancer Neg Hx    Colon cancer Neg Hx     Social History   Tobacco Use   Smoking status: Never   Smokeless tobacco: Never  Vaping Use   Vaping status: Never Used  Substance Use Topics   Alcohol use: No   Drug use: No    ROS   Objective:   Vitals: BP (!) 162/107 (BP Location: Left Arm)   Pulse 79   Temp 98.4 F (36.9 C) (Oral)   Resp 18   LMP 01/23/2019 (Within Weeks)   SpO2 94%   Physical Exam Constitutional:      General: She is not in acute  distress.    Appearance: Normal appearance. She is well-developed. She is not ill-appearing, toxic-appearing or diaphoretic.  HENT:     Head: Normocephalic and atraumatic.     Nose: Nose normal.     Mouth/Throat:     Mouth: Mucous membranes are moist.     Comments: Poor dentition.  Multiple missing molars both upper and lower.  There is 1 remaining molar on the left side with significant gingival recession, surrounding gingival erythema and tenderness. Eyes:     General: No scleral icterus.       Right eye: No discharge.        Left eye: No discharge.     Extraocular Movements: Extraocular movements intact.  Cardiovascular:     Rate and Rhythm: Normal rate.  Pulmonary:     Effort: Pulmonary effort is normal.  Skin:    General: Skin is warm and dry.   Neurological:     General: No focal deficit present.     Mental Status: She is alert and oriented to person, place, and time.  Psychiatric:        Mood and Affect: Mood normal.        Behavior: Behavior normal.     Assessment and Plan :   PDMP not reviewed this encounter.  1. Dental infection    Start Augmentin for dental infection/abscess, use ibuprofen for pain and inflammation. Emphasized need for dental surgeon consult. Counseled patient on potential for adverse effects with medications prescribed/recommended today, strict ER and return-to-clinic precautions discussed, patient verbalized understanding.    Wallis Bamberg, [place] 06/14/23 1535

## 2023-06-14 NOTE — Discharge Instructions (Signed)
Make sure you schedule an appointment with a dentist/dental surgeon as soon as possible.  You may try some of the resources below.  For now start Augmentin for a suspected dental infection. Use ibuprofen for pain and inflammation.   [facility] [place] Address: [address] [place], [place], [place] [address] Phone: [phone]  [facility] [phone] extension [phone] [address]  Dr. Lawrence Marseilles [phone] [address]  [place] [phone] [address] [facility] [place].  [facility] [phone] extension [phone] [place], [place], [address] First come first serve for the first 10 clients.  May do simple extractions only, no wisdom teeth or surgery.  You may try the second for Thursday of the month starting at 6:30 AM.  [facility] You may call the school to see if they are still helping to provide dental care for emergent cases.

## 2023-10-26 ENCOUNTER — Ambulatory Visit: Admitting: Orthopaedic Surgery

## 2023-10-29 ENCOUNTER — Encounter: Payer: Self-pay | Admitting: Orthopaedic Surgery

## 2023-10-29 ENCOUNTER — Ambulatory Visit: Admitting: Orthopaedic Surgery

## 2023-10-29 DIAGNOSIS — G5601 Carpal tunnel syndrome, right upper limb: Secondary | ICD-10-CM | POA: Diagnosis not present

## 2023-10-29 NOTE — Progress Notes (Signed)
 Office Visit Note   Patient: Caitlin [name]           Date of Birth: 1975/12/02           MRN: [id number] Visit Date: 10/29/2023              Requested by: [facility] [address] [place],  [place] [address] PCP: [facility]   Assessment & Plan: Visit Diagnoses:  1. Right carpal tunnel syndrome     Plan: History of Present Illness Caitlin [name] is a 48 year old female with moderate carpal tunnel syndrome who presents with worsening symptoms.  She experiences worsening symptoms of carpal tunnel syndrome. A carpal tunnel injection nine months ago provided only short-lived relief. Nerve conduction studies confirm moderate to severe disease in 2018. She is not on blood thinners and does not have diabetes.  Physical Exam MUSCULOSKELETAL: Hand exam unchanged from last visit.  Assessment and Plan Moderate to severe right carpal Tunnel Syndrome Confirmed by nerve conduction studies. Symptoms worsening despite conservative management. Surgical intervention warranted. - Schedule outpatient carpal tunnel release surgery.  - detailed surgical plan discussed  Total face to face encounter time was greater than 25 minutes and over half of this time was spent in counseling and/or coordination of care.   Follow-Up Instructions: No follow-ups on file.    Subjective: Chief Complaint  Patient presents with   Right Hand - Pain    HPI  Review of Systems  Constitutional: Negative.   HENT: Negative.    Eyes: Negative.   Respiratory: Negative.    Cardiovascular: Negative.   Endocrine: Negative.   Musculoskeletal: Negative.   Neurological: Negative.   Hematological: Negative.   Psychiatric/Behavioral: Negative.    All other systems reviewed and are negative.    Objective: Vital Signs: LMP 01/23/2019 (Within Weeks)   Physical Exam Vitals and nursing note reviewed.  Constitutional:      Appearance: She is well-developed.  HENT:      Head: Atraumatic.     Nose: Nose normal.   Eyes:     Extraocular Movements: Extraocular movements intact.    Cardiovascular:     Pulses: Normal pulses.  Pulmonary:     Effort: Pulmonary effort is normal.  Abdominal:     Palpations: Abdomen is soft.   Musculoskeletal:     Cervical back: Neck supple.   Skin:    General: Skin is warm.     Capillary Refill: Capillary refill takes less than 2 seconds.   Neurological:     Mental Status: She is alert. Mental status is at baseline.   Psychiatric:        Behavior: Behavior normal.        Thought Content: Thought content normal.        Judgment: Judgment normal.     Specialty Comments:  02/19/2021 Nerve conduction study:  Right sural, superficial peroneal, ulnar sensory responses were normal.  Right median sensory responses peak latency was mildly prolonged, with mildly decreased snap amplitude.  Left median sensory responses were within normal limits  Bilateral median mixed response was more than 0.4 ms prolonged in comparison to ipsilateral ulnar mixed response  Right tibial, peroneal to EDB, ulnar, bilateral median motor responses were normal.  Electromyography:  Selected needle examination of right upper extremity muscles were normal, with exception of slightly decreased recruitment pattern at right abductor pollicis brevis.    Conclusion: This is an abnormal study.  There is electrodiagnostic evidence of mild  bilateral median neuropathy across the wrist, consistent with mild bilateral carpal tunnel syndromes, right worse than left, demyelinating in nature, there is no evidence of axonal loss.  There is no evidence of right cervical radiculopathy.       ------------------------------- [name] M.D. PhD --------  01/03/2019 NCV & EMG Findings: Extensive electrodiagnostic testing of the right upper extremity and additional studies of the left shows:  1. Right mixed palmar sensory responses show prolonged latency  (Median Palm, 2.3 ms).  Left mixed palmar and bilateral median and ulnar sensory responses are within normal limits.   2. Bilateral median and ulnar motor responses are within normal limits.   3. There is no evidence of active or chronic motor axonal loss changes affecting any of the tested muscles.  Motor unit configuration and recruitment pattern is within normal limits.     Impression: 1. Right median neuropathy at or distal to the wrist (very mild) consistent with a clinical diagnosis of carpal tunnel syndrome.   2. There is no evidence of a cervical radiculopathy affecting either upper extremity or left carpal tunnel syndrome.     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   Paresthesia 03/29/2019   Chemotherapy adverse reaction 03/29/2019   History of bariatric surgery 03/29/2019   Neck pain 03/29/2019   Insomnia disorder 03/14/2019   Family history of ovarian cancer    Family history of throat cancer    Family history of cervical cancer    Goals of care, counseling/discussion 02/23/2019   Primary peritoneal adenocarcinoma (HCC) 02/20/2019   Pain in joint, pelvic region and thigh 01/19/2019   Numbness 12/05/2018   Depression, major, in remission (HCC) 10/20/2016   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   Past Medical History:  Diagnosis Date   Bacterial vaginitis    Cancer (HCC)    Primary peritoneal carcinoma   Depression    Family history of cervical cancer    Family history of ovarian cancer    Family history of throat cancer    Hypercholesterolemia 05/17/2017    Family History  Problem Relation  Age of Onset   Ovarian cancer Mother 28   Diabetes Paternal Grandmother    Cancer Father        unknown type - possibly skin   HIV Father    Heart Problems Maternal Grandfather    Cancer Paternal Grandfather        unknown type, diagnosed older than 50   Cancer Maternal Uncle        unknown type, diagnosed older than 50   Throat cancer Maternal Uncle        diagnosed older than 61s, smoker   Cervical cancer Cousin        paternal cousin   Breast cancer Neg Hx    Colon cancer Neg Hx     Past Surgical History:  Procedure Laterality Date   ABDOMINAL HYSTERECTOMY     DIAGNOSTIC LAPAROSCOPY     DILATION AND CURETTAGE OF UTERUS     GASTRIC BYPASS  2017   HYSTEROSCOPY WITH D & C Bilateral 06/07/2013   Procedure: DILATATION AND CURETTAGE /HYSTEROSCOPY with bilateral tubal cathetrization  ;  Surgeon: Blane Bunting  Andree Bane, MD;  Location: [facility];  Service: Gynecology;  Laterality: Bilateral;   IR IMAGING GUIDED PORT INSERTION  03/03/2019   IR REMOVAL TUN ACCESS W/ PORT W/O FL MOD SED  07/27/2019   LAPAROSCOPY Left 06/07/2013   Procedure: LAPAROSCOPY DIAGNOSTIC with aspiration of left peritubal cyst  ;  Surgeon: Asa Bjork, MD;  Location: [facility];  Service: Gynecology;  Laterality: Left;   LAPAROTOMY Right 06/07/2013   Procedure: LAPAROTOMY with bilateral corneal segmental salpingectomy right corneal anastamosis;  Surgeon: Asa Bjork, MD;  Location: [facility];  Service: Gynecology;  Laterality: Right;   Social History   Occupational History   Occupation: receptionist  Tobacco Use   Smoking status: Never   Smokeless tobacco: Never  Vaping Use   Vaping status: Never Used  Substance and Sexual Activity   Alcohol use: No   Drug use: No   Sexual activity: Yes    Birth control/protection: None

## 2023-11-16 ENCOUNTER — Ambulatory Visit: Payer: Medicaid Other | Admitting: Hematology and Oncology

## 2023-11-16 ENCOUNTER — Other Ambulatory Visit: Payer: Medicaid Other

## 2023-11-18 ENCOUNTER — Inpatient Hospital Stay: Attending: Hematology and Oncology

## 2023-11-18 ENCOUNTER — Inpatient Hospital Stay: Admitting: Hematology and Oncology

## 2023-11-18 ENCOUNTER — Encounter: Payer: Self-pay | Admitting: Hematology and Oncology

## 2023-11-18 VITALS — BP 141/65 | HR 76 | Temp 97.9°F | Resp 18 | Wt 237.6 lb

## 2023-11-18 DIAGNOSIS — C482 Malignant neoplasm of peritoneum, unspecified: Secondary | ICD-10-CM | POA: Diagnosis not present

## 2023-11-18 DIAGNOSIS — Z8542 Personal history of malignant neoplasm of other parts of uterus: Secondary | ICD-10-CM | POA: Diagnosis present

## 2023-11-18 LAB — CBC WITH DIFFERENTIAL/PLATELET
Abs Immature Granulocytes: 0.01 10*3/uL (ref 0.00–0.07)
Basophils Absolute: 0 10*3/uL (ref 0.0–0.1)
Basophils Relative: 1 %
Eosinophils Absolute: 0.2 10*3/uL (ref 0.0–0.5)
Eosinophils Relative: 2 %
HCT: 41.7 % (ref 36.0–46.0)
Hemoglobin: 13.8 g/dL (ref 12.0–15.0)
Immature Granulocytes: 0 %
Lymphocytes Relative: 20 %
Lymphs Abs: 1.4 10*3/uL (ref 0.7–4.0)
MCH: 29.8 pg (ref 26.0–34.0)
MCHC: 33.1 g/dL (ref 30.0–36.0)
MCV: 90.1 fL (ref 80.0–100.0)
Monocytes Absolute: 0.5 10*3/uL (ref 0.1–1.0)
Monocytes Relative: 7 %
Neutro Abs: 4.9 10*3/uL (ref 1.7–7.7)
Neutrophils Relative %: 70 %
Platelets: 233 10*3/uL (ref 150–400)
RBC: 4.63 MIL/uL (ref 3.87–5.11)
RDW: 11.9 % (ref 11.5–15.5)
WBC: 6.9 10*3/uL (ref 4.0–10.5)
nRBC: 0 % (ref 0.0–0.2)

## 2023-11-18 LAB — COMPREHENSIVE METABOLIC PANEL WITH GFR
ALT: 29 U/L (ref 0–44)
AST: 31 U/L (ref 15–41)
Albumin: 3.8 g/dL (ref 3.5–5.0)
Alkaline Phosphatase: 66 U/L (ref 38–126)
Anion gap: 7 (ref 5–15)
BUN: 17 mg/dL (ref 6–20)
CO2: 30 mmol/L (ref 22–32)
Calcium: 9.6 mg/dL (ref 8.9–10.3)
Chloride: 103 mmol/L (ref 98–111)
Creatinine, Ser: 0.71 mg/dL (ref 0.44–1.00)
GFR, Estimated: >60 mL/min (ref >60)
Glucose, Bld: 103 mg/dL — ABNORMAL HIGH (ref 70–99)
Potassium: 3.9 mmol/L (ref 3.5–5.1)
Sodium: 140 mmol/L (ref 135–145)
Total Bilirubin: 0.7 mg/dL (ref 0.0–1.2)
Total Protein: 7.4 g/dL (ref 6.5–8.1)

## 2023-11-18 NOTE — Progress Notes (Signed)
 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    Almarie Bedford, MD  INTERVAL HISTORY: she returns for surveillance follow-up for history of primary peritoneal carcinoma She is doing well since last time I saw her She has lost a lot of weight with the assistance of weight loss medications Denies abdominal pain, bloating or changes in bowel habits  PHYSICAL EXAMINATION: ECOG PERFORMANCE STATUS: 0 - Asymptomatic  Vitals:   11/18/23 0934  BP: (!) 141/65  Pulse: 76  Resp: 18  Temp: 97.9 F (36.6 C)  SpO2: 97%   Filed Weights   11/18/23 0934  Weight: 237 lb 9.6 oz (107.8 kg)    Relevant data reviewed during this visit included CBC and CMP

## 2023-11-18 NOTE — Assessment & Plan Note (Addendum)
 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

## 2023-11-19 LAB — CA 125: Cancer Antigen (CA) 125: 4.9 U/mL (ref 0.0–38.1)

## 2023-11-23 ENCOUNTER — Encounter: Payer: Self-pay | Admitting: Orthopaedic Surgery

## 2024-03-20 ENCOUNTER — Encounter: Payer: Self-pay | Admitting: Radiology
